# Patient Record
Sex: Male | Born: 1940
Health system: Southern US, Community
[De-identification: ages and names within clinical notes are randomized; demographics above are authoritative.]

## PROBLEM LIST (undated history)

## (undated) DIAGNOSIS — I499 Cardiac arrhythmia, unspecified: Secondary | ICD-10-CM

## (undated) DIAGNOSIS — I517 Cardiomegaly: Secondary | ICD-10-CM

## (undated) DIAGNOSIS — I712 Thoracic aortic aneurysm, without rupture: Secondary | ICD-10-CM

## (undated) DIAGNOSIS — E785 Hyperlipidemia, unspecified: Secondary | ICD-10-CM

## (undated) DIAGNOSIS — J8410 Pulmonary fibrosis, unspecified: Secondary | ICD-10-CM

## (undated) DIAGNOSIS — Z9889 Other specified postprocedural states: Secondary | ICD-10-CM

## (undated) DIAGNOSIS — I428 Other cardiomyopathies: Secondary | ICD-10-CM

## (undated) DIAGNOSIS — J849 Interstitial pulmonary disease, unspecified: Secondary | ICD-10-CM

## (undated) DIAGNOSIS — I509 Heart failure, unspecified: Secondary | ICD-10-CM

## (undated) DIAGNOSIS — I319 Disease of pericardium, unspecified: Secondary | ICD-10-CM

## (undated) DIAGNOSIS — Z9289 Personal history of other medical treatment: Secondary | ICD-10-CM

## (undated) DIAGNOSIS — I340 Nonrheumatic mitral (valve) insufficiency: Secondary | ICD-10-CM

## (undated) DIAGNOSIS — G4733 Obstructive sleep apnea (adult) (pediatric): Secondary | ICD-10-CM

## (undated) DIAGNOSIS — R112 Nausea with vomiting, unspecified: Secondary | ICD-10-CM

## (undated) DIAGNOSIS — I2510 Atherosclerotic heart disease of native coronary artery without angina pectoris: Secondary | ICD-10-CM

## (undated) DIAGNOSIS — R011 Cardiac murmur, unspecified: Secondary | ICD-10-CM

## (undated) DIAGNOSIS — I10 Essential (primary) hypertension: Secondary | ICD-10-CM

## (undated) DIAGNOSIS — R001 Bradycardia, unspecified: Secondary | ICD-10-CM

## (undated) DIAGNOSIS — I480 Paroxysmal atrial fibrillation: Secondary | ICD-10-CM

## (undated) HISTORY — DX: Paroxysmal atrial fibrillation: I48.0

## (undated) HISTORY — DX: Interstitial pulmonary disease, unspecified: J84.9

## (undated) HISTORY — PX: BACK SURGERY: SHX140

## (undated) HISTORY — DX: Bradycardia, unspecified: R00.1

## (undated) HISTORY — DX: Thoracic aortic aneurysm, without rupture: I71.2

## (undated) HISTORY — DX: Obstructive sleep apnea (adult) (pediatric): G47.33

## (undated) HISTORY — PX: ORIF FINGER / THUMB FRACTURE: SUR932

## (undated) HISTORY — PX: FRACTURE SURGERY: SHX138

## (undated) HISTORY — PX: TONSILLECTOMY: SUR1361

## (undated) HISTORY — DX: Other cardiomyopathies: I42.8

## (undated) HISTORY — DX: Atherosclerotic heart disease of native coronary artery without angina pectoris: I25.10

## (undated) HISTORY — DX: Pulmonary fibrosis, unspecified: J84.10

## (undated) HISTORY — PX: APPENDECTOMY: SHX54

## (undated) HISTORY — DX: Personal history of other medical treatment: Z92.89

---

## 1969-03-11 HISTORY — PX: LUMBAR LAMINECTOMY: SHX95

## 1996-07-11 HISTORY — PX: CARDIAC CATHETERIZATION: SHX172

## 2002-05-17 ENCOUNTER — Ambulatory Visit (HOSPITAL_COMMUNITY): Admission: RE | Admit: 2002-05-17 | Discharge: 2002-05-17 | Payer: Self-pay | Admitting: Gastroenterology

## 2003-09-29 ENCOUNTER — Encounter: Admission: RE | Admit: 2003-09-29 | Discharge: 2003-09-29 | Payer: Self-pay | Admitting: Internal Medicine

## 2003-09-30 ENCOUNTER — Ambulatory Visit (HOSPITAL_COMMUNITY): Admission: RE | Admit: 2003-09-30 | Discharge: 2003-09-30 | Payer: Self-pay | Admitting: Orthopedic Surgery

## 2003-09-30 ENCOUNTER — Ambulatory Visit (HOSPITAL_BASED_OUTPATIENT_CLINIC_OR_DEPARTMENT_OTHER): Admission: RE | Admit: 2003-09-30 | Discharge: 2003-09-30 | Payer: Self-pay | Admitting: Orthopedic Surgery

## 2013-06-27 ENCOUNTER — Encounter: Payer: Self-pay | Admitting: Interventional Cardiology

## 2013-06-27 ENCOUNTER — Ambulatory Visit (INDEPENDENT_AMBULATORY_CARE_PROVIDER_SITE_OTHER): Payer: Medicare Other | Admitting: Interventional Cardiology

## 2013-06-27 VITALS — BP 139/77 | HR 69 | Ht 74.5 in | Wt 228.0 lb

## 2013-06-27 DIAGNOSIS — R9431 Abnormal electrocardiogram [ECG] [EKG]: Secondary | ICD-10-CM

## 2013-06-27 DIAGNOSIS — R9430 Abnormal result of cardiovascular function study, unspecified: Secondary | ICD-10-CM

## 2013-06-27 DIAGNOSIS — I309 Acute pericarditis, unspecified: Secondary | ICD-10-CM

## 2013-06-27 DIAGNOSIS — E782 Mixed hyperlipidemia: Secondary | ICD-10-CM

## 2013-06-27 DIAGNOSIS — Z0389 Encounter for observation for other suspected diseases and conditions ruled out: Secondary | ICD-10-CM

## 2013-06-27 NOTE — Progress Notes (Signed)
Patient ID: Bruce Cohen, male   DOB: [birth year], 72 y.o.   MRN: [id number]     Patient ID: Bruce Cohen MRN: [id number] DOB/AGE: 1941/05/26 72 y.o.   Referring Physician Dr. Kevan Ny   Reason for Consultation abnormal ECG  HPI: 72 y/o who has had recurrent pericarditis.  He has been treated with prednisone.  He was initially hospitalized in [place] with the first episode.  This was in the 1990s in [place].  He had an episode in 1998.  He had a cath which was clean.  He was treated with prednisone.  He  Had 2 further episodes which were mild.    He was a Dr. Kevan Ny office for a physical. He was feeling well and had a routine ECG. There were issues getting the stickers to stay on his chest. There is artifact on the ECG and a question of ischemia was raised. He was referred here for further evaluation. He has had no symptoms of chest discomfort or shortness of breath recently. He walks some in his neighborhood and does not have any problems with physical activity at this point. He quit smoking several years ago.   Current Outpatient Prescriptions  Medication Sig Dispense Refill  . amLODipine (NORVASC) 5 MG tablet Take 5 mg by mouth daily.       Marland Kitchen aspirin 81 MG tablet Take 81 mg by mouth daily.      . cholecalciferol (VITAMIN D) 1000 UNITS tablet 1,000 Units. 2 TABLETS DAILY      . isosorbide mononitrate (IMDUR) 30 MG 24 hr tablet Take 30 mg by mouth daily.       . Multiple Vitamins-Minerals (MULTIVITAMIN PO) Take by mouth daily.      . rosuvastatin (CRESTOR) 20 MG tablet Take 20 mg by mouth daily.      . valsartan-hydrochlorothiazide (DIOVAN-HCT) 160-12.5 MG per tablet Take 1 tablet by mouth daily.       . Vitamin D, Ergocalciferol, (DRISDOL) 50000 UNITS CAPS capsule Take 50,000 Units by mouth every 7 (seven) days.       No current facility-administered medications for this visit.   No past medical history on file.  Family History  Problem Relation Age of Onset  . Heart disease Mother    VALVE SURGERY    History   Social History  . Marital Status: Married    Spouse Name: N/A    Number of Children: N/A  . Years of Education: N/A   Occupational History  . Not on file.   Social History Main Topics  . Smoking status: Former Smoker -- 20 years    Types: Cigarettes  . Smokeless tobacco: Not on file  . Alcohol Use: Not on file  . Drug Use: Not on file  . Sexual Activity: Not on file   Other Topics Concern  . Not on file   Social History Narrative  . No narrative on file    No past surgical history on file.    (Not in a hospital admission)  Review of systems complete and found to be negative unless listed above .  No nausea, vomiting.  No fever chills, No focal weakness,  No palpitations.  Physical Exam: Filed Vitals:   06/27/13 1127  BP: 139/77  Pulse: 69    Weight: 228 lb (103.42 kg)  Physical exam:  [place]/AT EOMI No JVD, No carotid bruit RRR S1S2  No wheezing Soft. NT, nondistended No edema. No focal motor or sensory deficits Normal affect  Labs:  No results found for this basename: WBC, HGB, HCT, MCV, PLT   No results found for this basename: NA, K, CL, CO2, BUN, CREATININE, CALCIUM, LABALBU, PROT, BILITOT, ALKPHOS, ALT, AST, GLUCOSE,  in the last 168 hours No results found for this basename: CKTOTAL, CKMB, CKMBINDEX, TROPONINI    No results found for this basename: CHOL   No results found for this basename: HDL   No results found for this basename: LDLCALC   No results found for this basename: TRIG   No results found for this basename: CHOLHDL   No results found for this basename: LDLDIRECT      Radiology: Normal LVEF by prior echocardiogram left ventricle hypertrophy EKG: Normal sinus rhythm up sloping ST segments inferiorly and laterally  ASSESSMENT AND PLAN:  Abnormal ECG: I think some of this was artifact. He has no symptoms. No ischemia evaluation at this time.  Repeat ECG in our office showed no clear signs of  ischemia.  Left ventricular hypertrophy/moderate mitral annular calcification/mild mitral regurgitation: Plan for echocardiogram if he has further symptoms  History of pericarditis: No symptoms at this point. We'll manage as needed.  Hyperlipidemia: Continue Crestor.  LDL 89 at last check.  Followup in one year. Signed:   Fredric Mare, MD, [facility] 06/27/2013, 11:37 AM

## 2013-06-27 NOTE — Patient Instructions (Signed)
Your physician has recommended you make the following change in your medication:   1. Stop Imdur.  Your physician wants you to follow-up in: 1 year follow up with Dr. Eldridge Dace. You will receive a reminder letter in the mail two months in advance. If you don't receive a letter, please call our office to schedule the follow-up appointment.

## 2013-06-28 DIAGNOSIS — R9430 Abnormal result of cardiovascular function study, unspecified: Secondary | ICD-10-CM | POA: Insufficient documentation

## 2013-06-28 DIAGNOSIS — I309 Acute pericarditis, unspecified: Secondary | ICD-10-CM | POA: Insufficient documentation

## 2013-06-28 DIAGNOSIS — E782 Mixed hyperlipidemia: Secondary | ICD-10-CM | POA: Insufficient documentation

## 2013-07-03 ENCOUNTER — Telehealth: Payer: Self-pay | Admitting: Interventional Cardiology

## 2013-07-03 MED ORDER — METHYLPREDNISOLONE 4 MG PO KIT: PACK | ORAL | Status: DC

## 2013-07-03 NOTE — Telephone Encounter (Signed)
New Problem:  Pt states he believes he is having a parakiditis issue and he would like the doctor to call him in something. Pt is requesting a call back.

## 2013-07-03 NOTE — Telephone Encounter (Signed)
Pt calls today b/c he states he thinks he is having an episode of pericarditis. Pt states it started after dinner last night becoming progressively worse & up in the neck.  States it was largely relieved by 2 Aleve last pm. Pt was able to sleep  Pt woke up this morning with the same pain & took 2 more Aleve. States this helps for about 3-4 hours but feels he should also be taking Prednisone as this is the medication regimen he has been on in the past for Pericarditis  States last episode of Pericarditis was 5-7 years ago. Will discuss with the DOD & return call to pt.  Dr. Patty Sermons reviewed & prescription for Medrol dosepak called in for patient Pt is aware & will pick up today  Mylo Red RN

## 2014-09-09 DIAGNOSIS — I480 Paroxysmal atrial fibrillation: Secondary | ICD-10-CM

## 2014-09-09 DIAGNOSIS — I4819 Other persistent atrial fibrillation: Secondary | ICD-10-CM

## 2014-09-09 HISTORY — DX: Paroxysmal atrial fibrillation: I48.0

## 2014-09-09 HISTORY — DX: Other persistent atrial fibrillation: I48.19

## 2014-09-29 ENCOUNTER — Encounter (HOSPITAL_COMMUNITY): Payer: Self-pay | Admitting: Emergency Medicine

## 2014-09-29 ENCOUNTER — Emergency Department (HOSPITAL_COMMUNITY): Payer: Medicare Other

## 2014-09-29 ENCOUNTER — Inpatient Hospital Stay (HOSPITAL_COMMUNITY): Payer: Medicare Other

## 2014-09-29 ENCOUNTER — Telehealth: Payer: Self-pay | Admitting: Interventional Cardiology

## 2014-09-29 ENCOUNTER — Inpatient Hospital Stay (HOSPITAL_COMMUNITY)
Admission: EM | Admit: 2014-09-29 | Discharge: 2014-10-03 | DRG: 280 | Disposition: A | Discharge status: Home / Self Care | Payer: Medicare Other | Attending: Family Medicine | Admitting: Family Medicine

## 2014-09-29 DIAGNOSIS — R0602 Shortness of breath: Secondary | ICD-10-CM | POA: Insufficient documentation

## 2014-09-29 DIAGNOSIS — N182 Chronic kidney disease, stage 2 (mild): Secondary | ICD-10-CM | POA: Diagnosis present

## 2014-09-29 DIAGNOSIS — J849 Interstitial pulmonary disease, unspecified: Secondary | ICD-10-CM | POA: Insufficient documentation

## 2014-09-29 DIAGNOSIS — Z87891 Personal history of nicotine dependence: Secondary | ICD-10-CM | POA: Diagnosis present

## 2014-09-29 DIAGNOSIS — E785 Hyperlipidemia, unspecified: Secondary | ICD-10-CM | POA: Diagnosis present

## 2014-09-29 DIAGNOSIS — J449 Chronic obstructive pulmonary disease, unspecified: Secondary | ICD-10-CM | POA: Diagnosis present

## 2014-09-29 DIAGNOSIS — I5021 Acute systolic (congestive) heart failure: Secondary | ICD-10-CM | POA: Diagnosis present

## 2014-09-29 DIAGNOSIS — I509 Heart failure, unspecified: Secondary | ICD-10-CM | POA: Diagnosis not present

## 2014-09-29 DIAGNOSIS — I10 Essential (primary) hypertension: Secondary | ICD-10-CM | POA: Diagnosis not present

## 2014-09-29 DIAGNOSIS — J9601 Acute respiratory failure with hypoxia: Secondary | ICD-10-CM | POA: Diagnosis present

## 2014-09-29 DIAGNOSIS — I472 Ventricular tachycardia: Secondary | ICD-10-CM | POA: Diagnosis present

## 2014-09-29 DIAGNOSIS — Z9114 Patient's other noncompliance with medication regimen: Secondary | ICD-10-CM | POA: Diagnosis present

## 2014-09-29 DIAGNOSIS — N183 Chronic kidney disease, stage 3 unspecified: Secondary | ICD-10-CM | POA: Diagnosis present

## 2014-09-29 DIAGNOSIS — F17210 Nicotine dependence, cigarettes, uncomplicated: Secondary | ICD-10-CM | POA: Diagnosis present

## 2014-09-29 DIAGNOSIS — I429 Cardiomyopathy, unspecified: Secondary | ICD-10-CM | POA: Diagnosis present

## 2014-09-29 DIAGNOSIS — I129 Hypertensive chronic kidney disease with stage 1 through stage 4 chronic kidney disease, or unspecified chronic kidney disease: Secondary | ICD-10-CM | POA: Diagnosis present

## 2014-09-29 DIAGNOSIS — I480 Paroxysmal atrial fibrillation: Secondary | ICD-10-CM | POA: Diagnosis present

## 2014-09-29 DIAGNOSIS — J810 Acute pulmonary edema: Secondary | ICD-10-CM | POA: Diagnosis not present

## 2014-09-29 DIAGNOSIS — Z9119 Patient's noncompliance with other medical treatment and regimen: Secondary | ICD-10-CM

## 2014-09-29 DIAGNOSIS — E876 Hypokalemia: Secondary | ICD-10-CM | POA: Diagnosis present

## 2014-09-29 DIAGNOSIS — J8410 Pulmonary fibrosis, unspecified: Secondary | ICD-10-CM

## 2014-09-29 DIAGNOSIS — I4891 Unspecified atrial fibrillation: Principal | ICD-10-CM | POA: Diagnosis present

## 2014-09-29 DIAGNOSIS — J189 Pneumonia, unspecified organism: Secondary | ICD-10-CM | POA: Diagnosis present

## 2014-09-29 DIAGNOSIS — F1010 Alcohol abuse, uncomplicated: Secondary | ICD-10-CM | POA: Diagnosis present

## 2014-09-29 DIAGNOSIS — I214 Non-ST elevation (NSTEMI) myocardial infarction: Secondary | ICD-10-CM | POA: Diagnosis present

## 2014-09-29 DIAGNOSIS — I2510 Atherosclerotic heart disease of native coronary artery without angina pectoris: Secondary | ICD-10-CM | POA: Diagnosis present

## 2014-09-29 DIAGNOSIS — Z7982 Long term (current) use of aspirin: Secondary | ICD-10-CM | POA: Diagnosis not present

## 2014-09-29 DIAGNOSIS — Z91199 Patient's noncompliance with other medical treatment and regimen due to unspecified reason: Secondary | ICD-10-CM

## 2014-09-29 DIAGNOSIS — Z720 Tobacco use: Secondary | ICD-10-CM | POA: Diagnosis present

## 2014-09-29 HISTORY — DX: Essential (primary) hypertension: I10

## 2014-09-29 HISTORY — DX: Cardiac murmur, unspecified: R01.1

## 2014-09-29 HISTORY — DX: Cardiomegaly: I51.7

## 2014-09-29 HISTORY — DX: Disease of pericardium, unspecified: I31.9

## 2014-09-29 HISTORY — DX: Nonrheumatic mitral (valve) insufficiency: I34.0

## 2014-09-29 HISTORY — DX: Hyperlipidemia, unspecified: E78.5

## 2014-09-29 LAB — CBC
HEMATOCRIT: 41.7 % (ref 39.0–52.0)
HEMOGLOBIN: 14 g/dL (ref 13.0–17.0)
MCH: 31.4 pg (ref 26.0–34.0)
MCHC: 33.6 g/dL (ref 30.0–36.0)
MCV: 93.5 fL (ref 78.0–100.0)
Platelets: 337 10*3/uL (ref 150–400)
RBC: 4.46 MIL/uL (ref 4.22–5.81)
RDW: 14.2 % (ref 11.5–15.5)
WBC: 12.8 10*3/uL — AB (ref 4.0–10.5)

## 2014-09-29 LAB — COMPREHENSIVE METABOLIC PANEL
ALBUMIN: 3.4 g/dL — AB (ref 3.5–5.2)
ALK PHOS: 84 U/L (ref 39–117)
ALT: 11 U/L (ref 0–53)
AST: 13 U/L (ref 0–37)
Anion gap: 6 (ref 5–15)
BILIRUBIN TOTAL: 1.2 mg/dL (ref 0.3–1.2)
BUN: 16 mg/dL (ref 6–23)
CHLORIDE: 107 mmol/L (ref 96–112)
CO2: 26 mmol/L (ref 19–32)
Calcium: 8.4 mg/dL (ref 8.4–10.5)
Creatinine, Ser: 1.1 mg/dL (ref 0.50–1.35)
GFR calc Af Amer: 74 mL/min — ABNORMAL LOW (ref >90)
GFR calc non Af Amer: 64 mL/min — ABNORMAL LOW (ref >90)
Glucose, Bld: 106 mg/dL — ABNORMAL HIGH (ref 70–99)
POTASSIUM: 3.9 mmol/L (ref 3.5–5.1)
SODIUM: 139 mmol/L (ref 135–145)
TOTAL PROTEIN: 7.1 g/dL (ref 6.0–8.3)

## 2014-09-29 LAB — I-STAT TROPONIN, ED: TROPONIN I, POC: 0.03 ng/mL (ref 0.00–0.08)

## 2014-09-29 LAB — PROTIME-INR
INR: 1.25 (ref 0.00–1.49)
Prothrombin Time: 15.9 seconds — ABNORMAL HIGH (ref 11.6–15.2)

## 2014-09-29 LAB — MAGNESIUM: Magnesium: 1.9 mg/dL (ref 1.5–2.5)

## 2014-09-29 LAB — TROPONIN I
TROPONIN I: 0.06 ng/mL — AB (ref <0.031)
Troponin I: 0.05 ng/mL — ABNORMAL HIGH (ref <0.031)

## 2014-09-29 LAB — BRAIN NATRIURETIC PEPTIDE: B NATRIURETIC PEPTIDE 5: 745.9 pg/mL — AB (ref 0.0–100.0)

## 2014-09-29 LAB — HEPARIN LEVEL (UNFRACTIONATED): Heparin Unfractionated: <0.1 IU/mL — ABNORMAL LOW (ref 0.30–0.70)

## 2014-09-29 LAB — STREP PNEUMONIAE URINARY ANTIGEN: STREP PNEUMO URINARY ANTIGEN: NEGATIVE

## 2014-09-29 LAB — TSH: TSH: 2.081 u[IU]/mL (ref 0.350–4.500)

## 2014-09-29 LAB — MRSA PCR SCREENING: MRSA by PCR: NEGATIVE

## 2014-09-29 MED ORDER — HEPARIN (PORCINE) IN NACL 100-0.45 UNIT/ML-% IJ SOLN
1200.0000 [IU]/h | INTRAMUSCULAR | Status: DC
  Administered 2014-09-29: 1200 [IU]/h via INTRAVENOUS
  Filled 2014-09-29: qty 250

## 2014-09-29 MED ORDER — LORAZEPAM 2 MG/ML IJ SOLN: 1.0000 mg | Freq: Four times a day (QID) | INTRAMUSCULAR | Status: AC | PRN

## 2014-09-29 MED ORDER — ALUM & MAG HYDROXIDE-SIMETH 200-200-20 MG/5ML PO SUSP: 30.0000 mL | Freq: Four times a day (QID) | ORAL | Status: DC | PRN

## 2014-09-29 MED ORDER — DILTIAZEM HCL 100 MG IV SOLR
5.0000 mg/h | INTRAVENOUS | Status: DC
  Administered 2014-09-30 (×2): 10 mg/h via INTRAVENOUS

## 2014-09-29 MED ORDER — DOCUSATE SODIUM 100 MG PO CAPS
100.0000 mg | ORAL_CAPSULE | Freq: Two times a day (BID) | ORAL | Status: DC
  Administered 2014-09-29 – 2014-10-03 (×8): 100 mg via ORAL
  Filled 2014-09-29 (×8): qty 1

## 2014-09-29 MED ORDER — LORAZEPAM 1 MG PO TABS
1.0000 mg | ORAL_TABLET | Freq: Four times a day (QID) | ORAL | Status: AC | PRN
  Administered 2014-09-29: 1 mg via ORAL
  Filled 2014-09-29: qty 1

## 2014-09-29 MED ORDER — ROSUVASTATIN CALCIUM 10 MG PO TABS
20.0000 mg | ORAL_TABLET | Freq: Every day | ORAL | Status: DC
  Administered 2014-09-29 – 2014-10-02 (×4): 20 mg via ORAL
  Filled 2014-09-29 (×4): qty 2

## 2014-09-29 MED ORDER — ADULT MULTIVITAMIN W/MINERALS CH
1.0000 | ORAL_TABLET | Freq: Every day | ORAL | Status: DC
  Administered 2014-09-29 – 2014-10-03 (×5): 1 via ORAL
  Filled 2014-09-29 (×5): qty 1

## 2014-09-29 MED ORDER — VITAMIN B-1 100 MG PO TABS
100.0000 mg | ORAL_TABLET | Freq: Every day | ORAL | Status: DC
  Administered 2014-09-29 – 2014-10-03 (×5): 100 mg via ORAL
  Filled 2014-09-29 (×5): qty 1

## 2014-09-29 MED ORDER — MAGNESIUM HYDROXIDE 400 MG/5ML PO SUSP
30.0000 mL | Freq: Every day | ORAL | Status: DC | PRN
  Filled 2014-09-29: qty 30

## 2014-09-29 MED ORDER — HEPARIN (PORCINE) IN NACL 100-0.45 UNIT/ML-% IJ SOLN
2250.0000 [IU]/h | INTRAMUSCULAR | Status: DC
  Administered 2014-09-30: 1700 [IU]/h via INTRAVENOUS
  Administered 2014-09-30: 2000 [IU]/h via INTRAVENOUS
  Administered 2014-10-01: 2250 [IU]/h via INTRAVENOUS
  Filled 2014-09-29 (×3): qty 250

## 2014-09-29 MED ORDER — DEXTROSE 5 % IV SOLN
1.0000 g | Freq: Once | INTRAVENOUS | Status: DC
  Filled 2014-09-29: qty 10

## 2014-09-29 MED ORDER — LEVOFLOXACIN IN D5W 750 MG/150ML IV SOLN
750.0000 mg | INTRAVENOUS | Status: DC
  Filled 2014-09-29: qty 150

## 2014-09-29 MED ORDER — FUROSEMIDE 10 MG/ML IJ SOLN
40.0000 mg | Freq: Two times a day (BID) | INTRAMUSCULAR | Status: DC
  Administered 2014-09-29 – 2014-10-01 (×5): 40 mg via INTRAVENOUS
  Filled 2014-09-29 (×5): qty 4

## 2014-09-29 MED ORDER — FOLIC ACID 1 MG PO TABS
1.0000 mg | ORAL_TABLET | Freq: Every day | ORAL | Status: DC
  Administered 2014-09-29 – 2014-10-03 (×5): 1 mg via ORAL
  Filled 2014-09-29 (×5): qty 1

## 2014-09-29 MED ORDER — OXYCODONE HCL 5 MG PO TABS: 5.0000 mg | ORAL_TABLET | ORAL | Status: DC | PRN

## 2014-09-29 MED ORDER — POTASSIUM CHLORIDE CRYS ER 20 MEQ PO TBCR
20.0000 meq | EXTENDED_RELEASE_TABLET | Freq: Once | ORAL | Status: AC
  Administered 2014-09-29: 20 meq via ORAL
  Filled 2014-09-29: qty 1

## 2014-09-29 MED ORDER — TRAZODONE HCL 50 MG PO TABS
25.0000 mg | ORAL_TABLET | Freq: Every evening | ORAL | Status: DC | PRN
  Administered 2014-10-01: 25 mg via ORAL
  Filled 2014-09-29: qty 1

## 2014-09-29 MED ORDER — LORAZEPAM 2 MG/ML IJ SOLN
0.0000 mg | Freq: Two times a day (BID) | INTRAMUSCULAR | Status: DC
Start: 2014-10-01 — End: 2014-10-03

## 2014-09-29 MED ORDER — THIAMINE HCL 100 MG/ML IJ SOLN
100.0000 mg | Freq: Every day | INTRAMUSCULAR | Status: DC
  Filled 2014-09-29: qty 2

## 2014-09-29 MED ORDER — ONDANSETRON HCL 4 MG PO TABS: 4.0000 mg | ORAL_TABLET | Freq: Four times a day (QID) | ORAL | Status: DC | PRN

## 2014-09-29 MED ORDER — HEPARIN BOLUS VIA INFUSION
2500.0000 [IU] | Freq: Once | INTRAVENOUS | Status: AC
  Administered 2014-09-29: 2500 [IU] via INTRAVENOUS
  Filled 2014-09-29: qty 2500

## 2014-09-29 MED ORDER — LEVOFLOXACIN IN D5W 750 MG/150ML IV SOLN
750.0000 mg | Freq: Once | INTRAVENOUS | Status: AC
  Administered 2014-09-29: 750 mg via INTRAVENOUS
  Filled 2014-09-29: qty 150

## 2014-09-29 MED ORDER — SODIUM CHLORIDE 0.9 % IJ SOLN
3.0000 mL | Freq: Two times a day (BID) | INTRAMUSCULAR | Status: DC
  Administered 2014-09-30 – 2014-10-03 (×5): 3 mL via INTRAVENOUS

## 2014-09-29 MED ORDER — DEXTROSE 5 % IV SOLN
5.0000 mg/h | Freq: Once | INTRAVENOUS | Status: AC
  Administered 2014-09-29: 5 mg/h via INTRAVENOUS

## 2014-09-29 MED ORDER — ACETAMINOPHEN 650 MG RE SUPP
650.0000 mg | Freq: Four times a day (QID) | RECTAL | Status: DC | PRN
Start: 2014-09-29 — End: 2014-10-03

## 2014-09-29 MED ORDER — MORPHINE SULFATE 2 MG/ML IJ SOLN: 1.0000 mg | INTRAMUSCULAR | Status: DC | PRN

## 2014-09-29 MED ORDER — ONDANSETRON HCL 4 MG/2ML IJ SOLN
4.0000 mg | Freq: Four times a day (QID) | INTRAMUSCULAR | Status: DC | PRN
Start: 2014-09-29 — End: 2014-10-03

## 2014-09-29 MED ORDER — HEPARIN BOLUS VIA INFUSION
4000.0000 [IU] | Freq: Once | INTRAVENOUS | Status: AC
  Administered 2014-09-29: 4000 [IU] via INTRAVENOUS
  Filled 2014-09-29: qty 4000

## 2014-09-29 MED ORDER — DEXTROSE 5 % IV SOLN
500.0000 mg | Freq: Once | INTRAVENOUS | Status: DC
  Filled 2014-09-29: qty 500

## 2014-09-29 MED ORDER — ASPIRIN EC 81 MG PO TBEC
81.0000 mg | DELAYED_RELEASE_TABLET | Freq: Every day | ORAL | Status: DC
  Administered 2014-09-30 – 2014-10-02 (×3): 81 mg via ORAL
  Filled 2014-09-29 (×6): qty 1

## 2014-09-29 MED ORDER — LORAZEPAM 2 MG/ML IJ SOLN: 0.0000 mg | Freq: Four times a day (QID) | INTRAMUSCULAR | Status: AC

## 2014-09-29 MED ORDER — CHLORHEXIDINE GLUCONATE CLOTH 2 % EX PADS: 6.0000 | MEDICATED_PAD | Freq: Every day | CUTANEOUS | Status: DC

## 2014-09-29 MED ORDER — ACETAMINOPHEN 325 MG PO TABS
650.0000 mg | ORAL_TABLET | Freq: Four times a day (QID) | ORAL | Status: DC | PRN
  Administered 2014-09-29: 650 mg via ORAL
  Filled 2014-09-29: qty 2

## 2014-09-29 NOTE — Telephone Encounter (Signed)
I spoke with the patient's wife. She called to report the patient was en route to the [facility] ER currrently. He has had several months of progressively worsening SOB. The last 4 days have been the worst.  Per the patient's wife, she has encouraged him to seek medical treatment, but he would not schedule follow up with Dr. Irish Lack or Dr. Inda Merlin.  The patient's wife also reports that he is not truthful with providers when he is seen.  Per her report, he will not take his medications unless he is scheduled to see a provider, then he will take them for a few days prior.  He is smoking 2 PPD, but will tell his providers he is not smoking very much. He is drinking "a lot" per her report and will go on 12 hour binges, but again, will report to providers that his alcohol consumption is minimal.  She denies that the patient has had any lower extremity edema or abdominal fullness.  The family did try to get him to the ER this weekend, but he refused.  This morning, he requested they call EMS.  I advised the patient's wife this could be cardiac vs pulmonary, I am uncertain, but it was best for the patient to agree to evaluation in the ER.  She wanted to let Dr. Irish Lack know. I advised I would forward the message to him, but that the ER MD will evaluate him first and call for a cardiology consult if needed.  He has a history of pericarditis.

## 2014-09-29 NOTE — ED Provider Notes (Signed)
CSN: [id number]     Arrival date & time 09/29/14  1030 History   First MD Initiated Contact with Patient 09/29/14 1038     Chief Complaint  Patient presents with  . Atrial Fibrillation     (Consider location/radiation/quality/duration/timing/severity/associated sxs/prior Treatment) Patient is a 74 y.o. male presenting with atrial fibrillation and shortness of breath. The history is provided by the patient.  Atrial Fibrillation This is a new problem. Episode onset: unknown. The problem occurs constantly. The problem has not changed since onset.Associated symptoms include chest pain (heaviness with SOB) and shortness of breath. Pertinent negatives include no abdominal pain. Nothing aggravates the symptoms. Nothing relieves the symptoms.  Shortness of Breath Severity:  Moderate Onset quality:  Gradual Timing:  Constant Progression:  Unchanged Chronicity:  New Context: not URI   Relieved by:  Nothing Worsened by:  Nothing tried Associated symptoms: chest pain (heaviness with SOB) and cough   Associated symptoms: no abdominal pain, no fever and no vomiting     History reviewed. No pertinent past medical history. History reviewed. No pertinent past surgical history. Family History  Problem Relation Age of Onset  . Heart disease Mother     VALVE SURGERY   History  Substance Use Topics  . Smoking status: Former Smoker -- 20 years    Types: Cigarettes  . Smokeless tobacco: Not on file  . Alcohol Use: Not on file    Review of Systems  Constitutional: Negative for fever and chills.  Respiratory: Positive for cough and shortness of breath.   Cardiovascular: Positive for chest pain (heaviness with SOB).  Gastrointestinal: Negative for vomiting and abdominal pain.  All other systems reviewed and are negative.     Allergies  Review of patient's allergies indicates no known allergies.  Home Medications   Prior to Admission medications   Medication Sig Start Date End Date  Taking? Authorizing Provider  amLODipine (NORVASC) 5 MG tablet Take 5 mg by mouth daily.  06/20/13   Historical Provider, MD  aspirin 81 MG tablet Take 81 mg by mouth daily.    Historical Provider, MD  cholecalciferol (VITAMIN D) 1000 UNITS tablet 1,000 Units. 2 TABLETS DAILY    Historical Provider, MD  methylPREDNISolone (MEDROL DOSEPAK) 4 MG tablet follow package directions 07/03/13   Darlin Coco, MD  Multiple Vitamins-Minerals (MULTIVITAMIN PO) Take by mouth daily.    Historical Provider, MD  rosuvastatin (CRESTOR) 20 MG tablet Take 20 mg by mouth daily.    Historical Provider, MD  valsartan-hydrochlorothiazide (DIOVAN-HCT) 160-12.5 MG per tablet Take 1 tablet by mouth daily.  06/20/13   Historical Provider, MD  Vitamin D, Ergocalciferol, (DRISDOL) 50000 UNITS CAPS capsule Take 50,000 Units by mouth every 7 (seven) days.    Historical Provider, MD   BP 138/96 mmHg  Pulse 100  Temp(Src) 97.6 F (36.4 C) (Oral)  Resp 34  SpO2 92% Physical Exam  Constitutional: He is oriented to person, place, and time. He appears well-developed and well-nourished. No distress.  HENT:  Head: Normocephalic and atraumatic.  Mouth/Throat: No oropharyngeal exudate.  Eyes: EOM are normal. Pupils are equal, round, and reactive to light.  Neck: Normal range of motion. Neck supple.  Cardiovascular: Normal rate.  An irregularly irregular rhythm present. Exam reveals no friction rub.   No murmur heard. Pulmonary/Chest: Effort normal. No respiratory distress. He has wheezes (mild, diffuse). He has rales (mid and lower lobes).  Abdominal: He exhibits no distension. There is no tenderness. There is no rebound.  Musculoskeletal: Normal range  of motion. He exhibits no edema.  Neurological: He is alert and oriented to person, place, and time.  Skin: He is not diaphoretic.    ED Course  Procedures (including critical care time) Labs Review Labs Reviewed  CBC - Abnormal; Notable for the following:    WBC 12.8  (*)    All other components within normal limits  COMPREHENSIVE METABOLIC PANEL  BRAIN NATRIURETIC PEPTIDE  PROTIME-INR  Randolm Idol, ED    Imaging Review Dg Chest 2 View  09/29/2014   CLINICAL DATA:  Shortness of breath for 5 days.  EXAM: CHEST  2 VIEW  COMPARISON:  None.  FINDINGS: There is a degree of underlying emphysema. There is widespread interstitial opacity throughout the lungs diffusely. There is some patchy airspace consolidation in the upper lobes, more on the right than on the left. The heart is borderline enlarged with pulmonary vascularity within normal limits. No effusions. No adenopathy. No bone lesions.  IMPRESSION: Question a degree of congestive heart failure superimposed on emphysema. All of the changes which are present may represent interstitial fibrosis as opposed to edema. Without prior studies, this differentiation is difficult. Patchy opacity in the upper lobes potentially could also represent a degree of superimposed pneumonia. Given these changes, a followup study in 5-7 days is advised to assess for stability.   Electronically Signed   By: Lowella Grip III M.D.   On: 09/29/2014 11:44     EKG Interpretation   Date/Time:  Monday September 29 2014 10:37:58 EDT Ventricular Rate:  91 PR Interval:    QRS Duration: 110 QT Interval:  387 QTC Calculation: 476 R Axis:   84 Text Interpretation:  Atrial fibrillation vs Atrial flutter - new Probable  lateral infarct, age indeterminate Confirmed by Mingo Amber  MD, Pruitt Taboada [phone])  on 09/29/2014 10:40:53 AM     CRITICAL CARE Performed by: Osvaldo Shipper   Total critical care time: 30 minutes  Critical care time was exclusive of separately billable procedures and treating other patients.  Critical care was necessary to treat or prevent imminent or life-threatening deterioration.  Critical care was time spent personally by me on the following activities: development of treatment plan with patient and/or  surrogate as well as nursing, discussions with consultants, evaluation of patient's response to treatment, examination of patient, obtaining history from patient or surrogate, ordering and performing treatments and interventions, ordering and review of laboratory studies, ordering and review of radiographic studies, pulse oximetry and re-evaluation of patient's condition.  MDM   Final diagnoses:  Shortness of breath  Atrial fibrillation with RVR  Community acquired pneumonia    86M here with shortness of breath. Present for the past 5 days, intermittent episodes of SOB. No CP, but does have some mild chest heaviness/tightness with his SOB. No hx of COPD, asthma.  EKG shows Afib/Aflutter, new from prior. Denies any palpitations. CXR with some rales/wheezes. Without hx of lung disease, concern for new CHF. No hypoxia, but feels better on oxygen. CXR shows CHF vs pneumonia vs interstitial issues.  Does have a white count, will get cultures and plan for admission. Will await BNP to decide on Lasix. He did get Cardizem with EMS which improved his symptoms. After cardizem wore off, HR began to get elevated again. Cardizem gtt initiated.    Evelina Bucy, MD 09/29/14 [phone]

## 2014-09-29 NOTE — Telephone Encounter (Signed)
New Msg       Pt is having trouble breathing and its getting worse.   Pt is on the way to the ER by ambulance. Pt wife calling to inform Dr. Irish Lack of this.    Wife not sure of which hospital.   Please return call to wife.

## 2014-09-29 NOTE — Progress Notes (Signed)
ANTICOAGULATION CONSULT NOTE - Follow-Up Consult  Pharmacy Consult:  Heparin Indication: atrial fibrillation  No Known Allergies  Patient Measurements: Height: 6\' 2"  (188 cm) Weight: 208 lb 6.4 oz (94.53 kg) IBW/kg (Calculated) : 82.2 Heparin Dosing Weight: 84 kg  Vital Signs: Temp: 97.5 F (36.4 C) (03/21 2000) Temp Source: Oral (03/21 2000) BP: 151/93 mmHg (03/21 1650) Pulse Rate: 101 (03/21 1650)  Labs:  Recent Labs  09/29/14 1125 09/29/14 1827 09/29/14 2043  HGB 14.0  --   --   HCT 41.7  --   --   PLT 337  --   --   LABPROT 15.9*  --   --   INR 1.25  --   --   HEPARINUNFRC  --   --  <0.10*  CREATININE 1.10  --   --   TROPONINI  --  0.05*  --     Estimated Creatinine Clearance: 68.5 mL/min (by C-G formula based on Cr of 1.1).   Medical History: Past Medical History  Diagnosis Date  . LVH (left ventricular hypertrophy)   . Mitral regurgitation   . Pericarditis     a. Hospitalized in [place]. Episode in 1998. Had a cath which was clean (also in 1990s). 2 further episodes that were mild.  . Hyperlipidemia   . Hypertension   . Atrial fibrillation with RVR 09/2014    Newly recognized/notes 09/29/2014      Assessment: 5 YOM presented with worsening SOB and found to be in Afib with RVR.  Pharmacy consulted to initiate IV heparin.  Baseline labs and home meds reviewed.  Initial heparin level undetectable on heparin 1200 units/hr.  No bleeding or complications noted.  Goal of Therapy:  Heparin level 0.3-0.7 units/ml Monitor platelets by anticoagulation protocol: Yes  Plan:  -Heparin 2500 units IV x 1 -Then increase IV heparin gtt to 1450 units/hr. -Recheck heparin level and CBC with AM labs.  Uvaldo Rising, BCPS  Clinical Pharmacist Pager [phone]  09/29/2014 9:54 PM

## 2014-09-29 NOTE — ED Notes (Signed)
Per EMS pt c/o chest pressure, SOB for the past 5 days. EMS arrival noted patient in Afib RVR. Pt has no hx of Afib. Pt received 20 Cardizem en route. Pt initial BP 190/110, HR 160 post Cardizem BP 150/80, HR 80s. Pt currently noted to be SOB, BP 152/91, HR 106, O2 98% Rm Air. Pt has a hx of  EKG changes in ST segment that have occurred from a childhood diagnosis. Pt sts he has been misdiagnosis for AMI in the past.

## 2014-09-29 NOTE — H&P (Signed)
[facility] History and Physical                                                                                    Bruce Cohen, is a 74 y.o. male  MRN: [id number]   DOB - May 12, 1941  Admit Date - 09/29/2014  Outpatient Primary MD for the patient is Bruce NEVILL, MD  With History of -  Hypertension Pericarditis  History reviewed. No pertinent past surgical history.  in for   Chief Complaint  Patient presents with  . Atrial Fibrillation     HPI 74 year old male patient past medical history of hypertension and P carditis as well as ongoing tobacco abuse. Presents to the hospital with progressive shortness of breath with dyspnea on exertion and intermittent orthopnea. Patient states symptoms have been ongoing for 5 days and began to become worse over the past 24 hours. He did not use any palpitations or chest pain but did have a heaviness in his chest. He has not expressed any lower extremity edema and has not had any cough fevers or chills or other constitutional symptoms.  EMS was called to the home and the patient was noted to be in atrial fibrillation with RVR with ventricular rates at least in the 120s he was given a bolus of IV Cardizem and transported to [facility]. Upon arrival to the ER he was afebrile, he was to Make with the Restoril to 34, his heart rate was 95 and he remained in atrial fibrillation. His O2 sats are 96% on room air but given his symptoms oxygen was applied. The chest x-ray revealed COPD with widespread interstitial opacity throughout the lungs diffusely with some patchy airspace consolidation in the upper lobes more right than the left as well as a degree of likely heart failure. Patient did have leukocytosis as well with a white count of 12,800. His BNP was 746 and troponin was negative. Glucose was mildly elevated at 106. EKG revealed atrial fibrillation with flutter waves and some of the leads with ventricular rate 91. Subsequently  patient's heart rate has begun to climb and at times peaks up into the 120s. During our questioning of the patient he confirm the above symptoms prior to arrival. His son also from the patient had not taken any of his prescribed medications including blood pressure medicines for at least 6 months. The patient reports he quit taking his medications because he had become frustrated with the healthcare in pharmacy system and cost of medications. She also reports he did not receive a flu shot this year.  Review of Systems   In addition to the HPI above,  No Fever-chills, myalgias or other constitutional symptoms No Headache, changes with Vision or hearing, new weakness, tingling, numbness in any extremity, No problems swallowing food or Liquids, indigestion/reflux No Chest pain, Cough or Shortness of Breath, palpitations, orthopnea or DOE No Abdominal pain, N/V; no melena or hematochezia, no dark tarry stools, Bowel movements are regular, No dysuria, hematuria or flank pain No new skin rashes, lesions, masses or bruises, No new joints pains-aches No recent weight gain or loss No polyuria, polydypsia or polyphagia,  *A full 10 point  Review of Systems was done, except as stated above, all other Review of Systems were negative.  Social History History  Substance Use Topics  . Smoking status:  Current Smoker -- 20 years    Types: Cigarettes  . Smokeless tobacco: Not on file  . Alcohol Use: Not on file    Family History Family History  Problem Relation Age of Onset  . Heart disease Mother Father      VALVE SURGERY Heart disease     Prior to Admission medications   Medication Sig Start Date End Date Taking? Authorizing Provider  aspirin 81 MG tablet Take 81 mg by mouth daily.   Yes Historical Provider, MD  cholecalciferol (VITAMIN D) 1000 UNITS tablet 1,000 Units. 2 TABLETS DAILY   Yes Historical Provider, MD  amLODipine (NORVASC) 5 MG tablet Take 5 mg by mouth daily.  06/20/13    Historical Provider, MD  methylPREDNISolone (MEDROL DOSEPAK) 4 MG tablet follow package directions 07/03/13   Darlin Coco, MD  Multiple Vitamins-Minerals (MULTIVITAMIN PO) Take by mouth daily.    Historical Provider, MD  rosuvastatin (CRESTOR) 20 MG tablet Take 20 mg by mouth daily.    Historical Provider, MD  valsartan-hydrochlorothiazide (DIOVAN-HCT) 160-12.5 MG per tablet Take 1 tablet by mouth daily.  06/20/13   Historical Provider, MD  Vitamin D, Ergocalciferol, (DRISDOL) 50000 UNITS CAPS capsule Take 50,000 Units by mouth every 7 (seven) days.    Historical Provider, MD    No Known Allergies  Physical Exam  Vitals  Blood pressure 158/116, pulse 74, temperature 97.6 F (36.4 C), temperature source Oral, resp. rate 35, height 6\' 2"  (1.88 m), weight 185 lb (83.915 kg), SpO2 98 %.   General:  In no acute distress, appears healthy and well nourished  Psych:  Normal affect, Denies Suicidal or Homicidal ideations, Awake Alert, Oriented X 3. Speech and thought patterns are clear and appropriate, no apparent short term memory deficits  Neuro:   No focal neurological deficits, CN II through XII intact, Strength 5/5 all 4 extremities, Sensation intact all 4 extremities.  ENT:  Ears and Eyes appear Normal, Conjunctivae clear, PER. Moist oral mucosa without erythema or exudates.  Neck:  Supple, No lymphadenopathy appreciated  Respiratory:  Symmetrical chest wall movement, Good air movement bilaterally, bilateral crackles, no cannula oxygen  Cardiac: Irregular with atrial fibrillation, No Murmurs, no LE edema noted, no JVD, No carotid bruits, peripheral pulses palpable at 2+  Abdomen:  Positive bowel sounds, Soft, Non tender, Non distended,  No masses appreciated, no obvious hepatosplenomegaly  Skin:  No Cyanosis, Normal Skin Turgor, No Skin Rash or Bruise.  Extremities: Symmetrical without obvious trauma or injury,  no effusions.  Data Review  CBC  Recent Labs Lab  09/29/14 1125  WBC 12.8*  HGB 14.0  HCT 41.7  PLT 337  MCV 93.5  MCH 31.4  MCHC 33.6  RDW 14.2    Chemistries   Recent Labs Lab 09/29/14 1125  NA 139  K 3.9  CL 107  CO2 26  GLUCOSE 106*  BUN 16  CREATININE 1.10  CALCIUM 8.4  AST 13  ALT 11  ALKPHOS 84  BILITOT 1.2    estimated creatinine clearance is 68.5 mL/min (by C-G formula based on Cr of 1.1).  No results for input(s): TSH, T4TOTAL, T3FREE, THYROIDAB in the last 72 hours.  Invalid input(s): FREET3  Coagulation profile  Recent Labs Lab 09/29/14 1125  INR 1.25    No results for input(s): DDIMER in the last  72 hours.  Cardiac Enzymes No results for input(s): CKMB, TROPONINI, MYOGLOBIN in the last 168 hours.  Invalid input(s): CK  Invalid input(s): POCBNP  Urinalysis No results found for: COLORURINE, APPEARANCEUR, LABSPEC, PHURINE, GLUCOSEU, HGBUR, BILIRUBINUR, KETONESUR, PROTEINUR, UROBILINOGEN, NITRITE, LEUKOCYTESUR  Imaging results:   Dg Chest 2 View  09/29/2014   CLINICAL DATA:  Shortness of breath for 5 days.  EXAM: CHEST  2 VIEW  COMPARISON:  None.  FINDINGS: There is a degree of underlying emphysema. There is widespread interstitial opacity throughout the lungs diffusely. There is some patchy airspace consolidation in the upper lobes, more on the right than on the left. The heart is borderline enlarged with pulmonary vascularity within normal limits. No effusions. No adenopathy. No bone lesions.  IMPRESSION: Question a degree of congestive heart failure superimposed on emphysema. All of the changes which are present may represent interstitial fibrosis as opposed to edema. Without prior studies, this differentiation is difficult. Patchy opacity in the upper lobes potentially could also represent a degree of superimposed pneumonia. Given these changes, a followup study in 5-7 days is advised to assess for stability.   Electronically Signed   By: Lowella Grip III M.D.   On: 09/29/2014 11:44      EKG: Atrial fibrillation/flutter with ventricular rate 91 bpm, QTC 476   Assessment & Plan  Principal Problem:   New Onset Atrial fibrillation with RVR -Admit to stepdown -BeginCardizem infusion -CHADVASC is 2 so we will begin IV heparin -Cardiology consulted -Check TSH  Active Problems:   Acute respiratory failure with hypoxia/Pulmonary edema, acute/? Atypical pneumonia -Begin Lasix 40 mg IV q 12 hours first dose now -Check 2-D echocardiogram -Has leukocytosis and patient did not receive influenza vaccine this year so could have atypical presentation of flu pneumonitis so check influenza panel and begin droplet precautions -Cover for possible community acquired pneumonia as well with Levaquin -Chest x-ray also appears to have some chronic interstitial disease concerning for possible urinary fibrosis so we'll obtain CT of the chest -Check HIV, blood cultures, sputum culture (if patient can produce), urinary strep and Legionella panel    HTN  -Blood pressure currently moderately controlled and was not taking medications prior to admission -Currently will utilize IV Cardizem for hypertensive control    COPD  -Clearly documented on chest x-ray and with history ongoing tobacco abuse -No wheezing and no signs of exacerbation at this juncture    CKD, stage II -No baseline labs for comparison    Patient nonadherence -Scars with patient at time of admission importance of taking all medications especially antihypertensive medications    Tobacco abuse -Counseled regarding cessation    DVT Prophylaxis: Full dose heparin  Family Communication:   Son at bedside  Code Status:  Full code  Condition:  Stable  Time spent in minutes : 60   ELLIS,ALLISON L. ANP on 09/29/2014 at 12:18 PM  Between 7am to 7pm - Pager - [phone]  After 7pm go to [email or web address] - password [email or web address]  And look for the night coverage person covering me after hours  [facility]

## 2014-09-29 NOTE — Progress Notes (Signed)
Paged Dr. Luiz Ochoa regarding pt's 15 beat run of Vtach. Pt asymptomatic. Orders received for a magnesium level and oral potassium. Will continue to monitor pt.

## 2014-09-29 NOTE — Progress Notes (Signed)
Consulting cardiology team called to make [place] aware that documentation has been found in the EMR regarding pt with hidden alcohol use as well as binge drinking. Will initiate CIWA protocol.  Erin Hearing, ANP

## 2014-09-29 NOTE — Consult Note (Signed)
Cardiology Consultation Note  Patient ID: Bruce Cohen, MRN: [id number], DOB/AGE: 12/20/1940 74 y.o. Admit date: 09/29/2014   Date of Consult: 09/29/2014 Primary Physician: Henrine Screws, MD Primary Cardiologist: Seen by Irish Lack for abnormal EKG 06/2013  Chief Complaint: SOB Reason for Consultation: newly recognized atrial fibrillation, probable new CHF  HPI: Bruce Cohen is a 74 y/o M with history of 60-years tobacco use, prior recurrent pericarditis, HTN, HLD, LVH, possible undisclosed EtOH use who presented to [facility] with 4-day history of dyspnea and newly recognized AF RVR. Regarding cardiac history, Bruce Cohen was initially hospitalized in [place] with Bruce Cohen first episode of pericarditis in the 1990s. Bruce Cohen had an episode in 1998 and cath was reportedly clean per patient. Bruce Cohen was treated with prednisone. Bruce Cohen had 2 further episodes which were mild.Bruce Cohen was seen by Dr. Irish Lack in 2014 for abnormal EKG in the setting of difficult sticker adherence, with subsequent EKG without clear signs of ischemia. 2D Echo 2012: mild-mod LVH, EF 55-60%, mild LAE, mod mitral annular calcification, mild MV regurgitation, grade 1 diastolic dysfunction.  Bruce Cohen has not been taking Bruce Cohen antihypertensives recently but denies any particular reason. Bruce Cohen noticed 4 days ago Bruce Cohen was becoming gradually more SOB. Dyspnea occurred initially with exertion but Bruce Cohen now Bruce Cohen even has spells when doing nothing at all. + Orthopnea. Bruce Cohen endorses a sensation of chest tightness when Bruce Cohen breathing gets particularly bad. No LEE, known weight changes, awareness of palpitations, syncope, fever, chills, sick contacts, recent travel/surgery/bedrest, history of bleeding or stroke. Due to worsened symptoms Bruce Cohen called EMS and was noted to be in AF RVR. Initial BP 190/110, 96-98% RA but tachypneic, HR 160s -> received 20mg  IV cardizem, now on a drip. Workup thus far reveals WBC 12.8k, BNP 745, glucose 106 otherwise labs nonacute including normal TSH and  troponin neg x 1. CXR with possible CHF superimposed on emphysema, versus interstitial fibrosis. High resolution CT has been ordered by IM. Started on Lasix 40mg  IV BID with brisk UOP thus far and modest improvement in dyspnea. Infectious respiratory workup underway.  Per phone note between our office and the patient's wife, "Bruce Cohen is drinking 'a lot' per her report and will go on 12 hour binges, but again, will report to providers that Bruce Cohen alcohol consumption is minimal." Patient reported rare alcohol use in the ER. Last cigarette 4 days ago due to dyspnea.  Past Medical History  Diagnosis Date  . LVH (left ventricular hypertrophy)   . Mitral regurgitation   . Pericarditis     a. Hospitalized in [place]. Episode in 1998. Had a cath which was clean. 2 further episodes that were mild.  . Hyperlipidemia   . Hypertension       Most Recent Cardiac Studies: 2D Echo 2012: mild-mod LVH, EF 55-60%, mild ALE, mod mitral annular calcification, mild MV regurgitation, grade 1 diastolic dysfunction.   Surgical History: History reviewed. No pertinent past surgical history.   Home Meds: Prior to Admission medications   Medication Sig Start Date End Date Taking? Authorizing Provider  aspirin 81 MG tablet Take 81 mg by mouth daily.   Yes Historical Provider, MD  cholecalciferol (VITAMIN D) 1000 UNITS tablet 1,000 Units. 2 TABLETS DAILY   Yes Historical Provider, MD  amLODipine (NORVASC) 5 MG tablet Take 5 mg by mouth daily.  06/20/13   Historical Provider, MD  methylPREDNISolone (MEDROL DOSEPAK) 4 MG tablet follow package directions 07/03/13   Darlin Coco, MD  Multiple Vitamins-Minerals (MULTIVITAMIN PO) Take by mouth daily.  Historical Provider, MD  rosuvastatin (CRESTOR) 20 MG tablet Take 20 mg by mouth daily.    Historical Provider, MD  valsartan-hydrochlorothiazide (DIOVAN-HCT) 160-12.5 MG per tablet Take 1 tablet by mouth daily.  06/20/13   Historical Provider, MD  Vitamin D, Ergocalciferol,  (DRISDOL) 50000 UNITS CAPS capsule Take 50,000 Units by mouth every 7 (seven) days.    Historical Provider, MD    Inpatient Medications:  . furosemide  40 mg Intravenous BID   . diltiazem (CARDIZEM) infusion    . heparin 1,200 Units/hr (09/29/14 1318)  . [START ON 09/30/2014] levofloxacin (LEVAQUIN) IV      Allergies: No Known Allergies  History   Social History  . Marital Status: Married    Spouse Name: N/A  . Number of Children: N/A  . Years of Education: N/A   Occupational History  . Not on file.   Social History Main Topics  . Smoking status: Current Every Day Smoker -- 60 years    Types: Cigarettes  . Smokeless tobacco: Not on file  . Alcohol Use: No  . Drug Use: No  . Sexual Activity: Not on file   Other Topics Concern  . Not on file   Social History Narrative     Family History  Problem Relation Age of Onset  . Heart disease Mother     VALVE SURGERY  . Heart failure Father   . CAD Neg Hx      Review of Systems All other systems reviewed and are otherwise negative except as noted above.  Labs: Troponin neg x 1.  Lab Results  Component Value Date   WBC 12.8* 09/29/2014   HGB 14.0 09/29/2014   HCT 41.7 09/29/2014   MCV 93.5 09/29/2014   PLT 337 09/29/2014    Recent Labs Lab 09/29/14 1125  NA 139  K 3.9  CL 107  CO2 26  BUN 16  CREATININE 1.10  CALCIUM 8.4  PROT 7.1  BILITOT 1.2  ALKPHOS 84  ALT 11  AST 13  GLUCOSE 106*   No results found for: CHOL, HDL, LDLCALC, TRIG No results found for: DDIMER  Radiology/Studies:  Dg Chest 2 View  09/29/2014   CLINICAL DATA:  Shortness of breath for 5 days.  EXAM: CHEST  2 VIEW  COMPARISON:  None.  FINDINGS: There is a degree of underlying emphysema. There is widespread interstitial opacity throughout the lungs diffusely. There is some patchy airspace consolidation in the upper lobes, more on the right than on the left. The heart is borderline enlarged with pulmonary vascularity within normal  limits. No effusions. No adenopathy. No bone lesions.  IMPRESSION: Question a degree of congestive heart failure superimposed on emphysema. All of the changes which are present may represent interstitial fibrosis as opposed to edema. Without prior studies, this differentiation is difficult. Patchy opacity in the upper lobes potentially could also represent a degree of superimposed pneumonia. Given these changes, a followup study in 5-7 days is advised to assess for stability.   Electronically Signed   By: Lowella Grip III M.D.   On: 09/29/2014 11:44    Wt Readings from Last 3 Encounters:  09/29/14 185 lb (83.915 kg)  06/27/13 228 lb (103.42 kg)    EKG: atrial fibrillation 91bpm with nonspecific ST-T wave changes  Physical Exam: Blood pressure 140/90, pulse 112, temperature 97.6 F (36.4 C), temperature source Oral, resp. rate 40, height 6\' 2"  (1.88 m), weight 185 lb (83.915 kg), SpO2 98 %. General: Well developed, well nourished  WM, in no acute distress. Head: Normocephalic, atraumatic, sclera non-icteric, no xanthomas, nares are without discharge.  Neck: Negative for carotid bruits. JVD not elevated. Lungs: Coarse bilaterally to auscultation without wheezes, rales, or rhonchi. Breathing is unlabored. Heart: Irregularly irregular, modestly tachycardic, HR, with S1 S2. No murmurs, rubs, or gallops appreciated. Abdomen: Soft, non-tender, non-distended with normoactive bowel sounds. No hepatomegaly. No rebound/guarding. No obvious abdominal masses. Msk:  Strength and tone appear normal for age. Extremities: No clubbing or cyanosis. No edema.  Distal pedal pulses are 2+ and equal bilaterally. Neuro: Alert and oriented X 3. No facial asymmetry. No focal deficit. Moves all extremities spontaneously. Psych:  Responds to questions appropriately with a normal affect.    Assessment and Plan:   1. Dyspnea, likely multifactorial - likely due to AF/CHF +/- contribution from other process.  Possibility of infectious etiologies and interstitial fibrosis are being evaluated. Suspect CXR more likely reflective of COPD with edema. Agree with cycling troponins. Lipid panel in AM for risk stratification.  2. Newly recognized atrial fibrillation - unclear duration, but SOB x at least 4 days. CHADSVASC tentatively 2 for HTN/age, possibly 3 if you count CHF, warranting anticoagulation. Before committing to oral anticoagulation will need to cycle troponins and make sure no further procedures are warranted (such as cath if Bruce Cohen were to rule in or have low EF). 2D Echo. Continue IV diltiazem for now. Suspect rate will improve with diuresis but we can consider rebolusing with additional IV diltiazem and titrate drip if needed.  3. Probable acute congestive heart failure, unclear whether systolic or diastolic - check 2D echo when HR is a little slower. This is still under Sign/Held so will likely not be done until tomorrow anyway. Agree with IV diuresis and monitoring of renal function.   4. Ongoing tobacco abuse - counseled regarding cessation.  5. Possible excess alcohol consumption - the patient has not revealed this to [place] but Bruce Cohen wife told our office nurse Bruce Cohen drinks a lot. I have notified IM in case they want to consider CIWA.  6. Hypertension, recently noncompliant with meds - importance of med compliance reinforced. Follow BP with addition of medications.  7. Remote history of pericarditis - without recent recurrence.  Signed, Melina Copa PA-C 09/29/2014, 4:01 PM  Patient seen and examined. I agree with the assessment and plan as detailed above. See also my additional thoughts below.   The patient has atrial fibrillation with a rapid rate. We really do not know when it started. Bruce Cohen does not sense the atrial fibrillation. Bruce Cohen has shortness of breath with evidence of congestive heart failure. There is mention by the family that there may be excess alcohol intake. Bruce Cohen does not talk about this. We  will need to assess Bruce Cohen LV function. Bruce Cohen rate is to be controlled with diltiazem. Bruce Cohen will be diuresed. Heparin is being used until we have further decisions about how aggressive the cardiac workup will be. Ultimately Bruce Cohen will need to be anticoagulated with oral anticoagulant, if it is felt to be safe. 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

## 2014-09-29 NOTE — Progress Notes (Signed)
Pt reported his weight at 185, weight actually is 208 lbs, significantly above baseline.  Rosaria Ferries, PA-C 09/29/2014 5:00 PM Beeper [phone]

## 2014-09-29 NOTE — Progress Notes (Addendum)
ANTICOAGULATION CONSULT NOTE - Initial Consult  Pharmacy Consult:  Heparin Indication: atrial fibrillation  No Known Allergies  Patient Measurements: Height: 6\' 2"  (188 cm) Weight: 185 lb (83.915 kg) IBW/kg (Calculated) : 82.2 Heparin Dosing Weight: 84 kg  Vital Signs: Temp: 97.6 F (36.4 C) (03/21 1046) Temp Source: Oral (03/21 1046) BP: 158/116 mmHg (03/21 1151) Pulse Rate: 74 (03/21 1151)  Labs:  Recent Labs  09/29/14 1125  HGB 14.0  HCT 41.7  PLT 337  LABPROT 15.9*  INR 1.25  CREATININE 1.10    Estimated Creatinine Clearance: 68.5 mL/min (by C-G formula based on Cr of 1.1).   Medical History: History reviewed. No pertinent past medical history.    Assessment: 10 YOM presented with worsening SOB and found to be in Afib with RVR.  Pharmacy consulted to initiate IV heparin.  Baseline labs and home meds reviewed.   Goal of Therapy:  Heparin level 0.3-0.7 units/ml Monitor platelets by anticoagulation protocol: Yes    Plan:  - Heparin 4000 units IV bolus x 1, then - Heparin gtt at 1200 units/hr - Check 8 hr HL - Daily HL / CBC - F/U oral AC when possible   Elenor Wildes D. Mina Marble, PharmD, BCPS Pager:  [phone] 09/29/2014, 12:17 PM    ===========================   Addendum: - add LVQ for CAP   Goal of Therapy: Resolution of infection   Plan: - Levaquin 750mg  IV Q24H - D/C Rocephin and azithromycin as meds have not been given, per discussion with Lissa Merlin, NP - Monitor renal fxn, clinical progress    Calisa Luckenbaugh D. Mina Marble, PharmD, BCPS Pager:  [phone] 09/29/2014, 12:20 PM

## 2014-09-30 DIAGNOSIS — J189 Pneumonia, unspecified organism: Secondary | ICD-10-CM | POA: Insufficient documentation

## 2014-09-30 DIAGNOSIS — N182 Chronic kidney disease, stage 2 (mild): Secondary | ICD-10-CM

## 2014-09-30 DIAGNOSIS — I509 Heart failure, unspecified: Secondary | ICD-10-CM

## 2014-09-30 DIAGNOSIS — Z720 Tobacco use: Secondary | ICD-10-CM

## 2014-09-30 DIAGNOSIS — J810 Acute pulmonary edema: Secondary | ICD-10-CM

## 2014-09-30 DIAGNOSIS — N183 Chronic kidney disease, stage 3 (moderate): Secondary | ICD-10-CM

## 2014-09-30 LAB — LIPID PANEL
CHOL/HDL RATIO: 3.9 ratio
Cholesterol: 116 mg/dL (ref 0–200)
HDL: 30 mg/dL — AB (ref >39)
LDL CALC: 69 mg/dL (ref 0–99)
Triglycerides: 87 mg/dL (ref <150)
VLDL: 17 mg/dL (ref 0–40)

## 2014-09-30 LAB — LEGIONELLA ANTIGEN, URINE

## 2014-09-30 LAB — COMPREHENSIVE METABOLIC PANEL
ALK PHOS: 77 U/L (ref 39–117)
ALT: 9 U/L (ref 0–53)
AST: 13 U/L (ref 0–37)
Albumin: 3.2 g/dL — ABNORMAL LOW (ref 3.5–5.2)
Anion gap: 8 (ref 5–15)
BUN: 16 mg/dL (ref 6–23)
CO2: 28 mmol/L (ref 19–32)
Calcium: 8.7 mg/dL (ref 8.4–10.5)
Chloride: 102 mmol/L (ref 96–112)
Creatinine, Ser: 1.22 mg/dL (ref 0.50–1.35)
GFR, EST AFRICAN AMERICAN: 66 mL/min — AB (ref >90)
GFR, EST NON AFRICAN AMERICAN: 57 mL/min — AB (ref >90)
GLUCOSE: 102 mg/dL — AB (ref 70–99)
Potassium: 3.4 mmol/L — ABNORMAL LOW (ref 3.5–5.1)
Sodium: 138 mmol/L (ref 135–145)
TOTAL PROTEIN: 6.9 g/dL (ref 6.0–8.3)
Total Bilirubin: 1.2 mg/dL (ref 0.3–1.2)

## 2014-09-30 LAB — CBC
HEMATOCRIT: 40.4 % (ref 39.0–52.0)
Hemoglobin: 13.5 g/dL (ref 13.0–17.0)
MCH: 31 pg (ref 26.0–34.0)
MCHC: 33.4 g/dL (ref 30.0–36.0)
MCV: 92.7 fL (ref 78.0–100.0)
Platelets: 346 10*3/uL (ref 150–400)
RBC: 4.36 MIL/uL (ref 4.22–5.81)
RDW: 14 % (ref 11.5–15.5)
WBC: 9.1 10*3/uL (ref 4.0–10.5)

## 2014-09-30 LAB — INFLUENZA PANEL BY PCR (TYPE A & B)
H1N1 flu by pcr: NOT DETECTED
INFLBPCR: NEGATIVE
Influenza A By PCR: NEGATIVE

## 2014-09-30 LAB — APTT: aPTT: 69 seconds — ABNORMAL HIGH (ref 24–37)

## 2014-09-30 LAB — HIV ANTIBODY (ROUTINE TESTING W REFLEX): HIV Screen 4th Generation wRfx: NONREACTIVE

## 2014-09-30 LAB — HEPARIN LEVEL (UNFRACTIONATED): Heparin Unfractionated: 0.12 IU/mL — ABNORMAL LOW (ref 0.30–0.70)

## 2014-09-30 LAB — PROTIME-INR
INR: 1.25 (ref 0.00–1.49)
Prothrombin Time: 15.8 seconds — ABNORMAL HIGH (ref 11.6–15.2)

## 2014-09-30 LAB — TROPONIN I: Troponin I: 0.06 ng/mL — ABNORMAL HIGH (ref <0.031)

## 2014-09-30 MED ORDER — AMLODIPINE BESYLATE 5 MG PO TABS
5.0000 mg | ORAL_TABLET | Freq: Every day | ORAL | Status: DC
  Administered 2014-09-30 – 2014-10-01 (×2): 5 mg via ORAL
  Filled 2014-09-30 (×2): qty 1

## 2014-09-30 MED ORDER — CETYLPYRIDINIUM CHLORIDE 0.05 % MT LIQD
7.0000 mL | Freq: Two times a day (BID) | OROMUCOSAL | Status: DC
  Administered 2014-09-30 – 2014-10-03 (×4): 7 mL via OROMUCOSAL

## 2014-09-30 MED ORDER — OFF THE BEAT BOOK
Freq: Once | Status: AC
  Administered 2014-09-30: 15:00:00
  Filled 2014-09-30: qty 1

## 2014-09-30 MED ORDER — POTASSIUM CHLORIDE CRYS ER 20 MEQ PO TBCR
30.0000 meq | EXTENDED_RELEASE_TABLET | Freq: Once | ORAL | Status: AC
  Administered 2014-09-30: 30 meq via ORAL
  Filled 2014-09-30 (×2): qty 1

## 2014-09-30 MED ORDER — SODIUM CHLORIDE 0.9 % IV SOLN
INTRAVENOUS | Status: DC
  Administered 2014-10-01: 04:00:00 via INTRAVENOUS

## 2014-09-30 MED ORDER — HEPARIN BOLUS VIA INFUSION
2000.0000 [IU] | Freq: Once | INTRAVENOUS | Status: AC
  Administered 2014-09-30: 2000 [IU] via INTRAVENOUS
  Filled 2014-09-30: qty 2000

## 2014-09-30 MED ORDER — ASPIRIN 81 MG PO CHEW
81.0000 mg | CHEWABLE_TABLET | ORAL | Status: AC
  Administered 2014-10-01: 81 mg via ORAL
  Filled 2014-09-30: qty 1

## 2014-09-30 MED ORDER — HEPARIN BOLUS VIA INFUSION
3000.0000 [IU] | Freq: Once | INTRAVENOUS | Status: AC
  Administered 2014-09-30: 3000 [IU] via INTRAVENOUS
  Filled 2014-09-30: qty 3000

## 2014-09-30 NOTE — Progress Notes (Signed)
ANTICOAGULATION CONSULT NOTE - Follow Up Consult  Pharmacy Consult for Heparin  Indication: atrial fibrillation  No Known Allergies  Patient Measurements: Height: 6\' 2"  (188 cm) Weight: 202 lb 9.6 oz (91.899 kg) IBW/kg (Calculated) : 82.2 Vital Signs: Temp: 97.8 F (36.6 C) (03/22 1135) Temp Source: Oral (03/22 1135) BP: 133/87 mmHg (03/22 1135) Pulse Rate: 100 (03/22 1200)  Labs:  Recent Labs  09/29/14 1125 09/29/14 1827 09/29/14 2043 09/29/14 2235 09/30/14 0423 09/30/14 1441  HGB 14.0  --   --   --  13.5  --   HCT 41.7  --   --   --  40.4  --   PLT 337  --   --   --  346  --   APTT  --   --   --   --  69*  --   LABPROT 15.9*  --   --   --  15.8*  --   INR 1.25  --   --   --  1.25  --   HEPARINUNFRC  --   --  <0.10*  --  <0.10* 0.12*  CREATININE 1.10  --   --   --  1.22  --   TROPONINI  --  0.05*  --  0.06* 0.06*  --     Estimated Creatinine Clearance: 61.8 mL/min (by C-G formula based on Cr of 1.22).  Assessment: 74 yo male with afib on heparin and heparin level ins 0.12 after heparin bolus and increase to 1700 units/hr.  Plans noted for oral anticoagulation post cath.  Goal of Therapy:  Heparin level 0.3-0.7 units/ml Monitor platelets by anticoagulation protocol: Yes   Plan:  -Heparin bolus 2000 units then increase infusion to 2000 units/hr -Heparin level in 8 hrs -Daily heparin level and CBC  Hildred Laser, Pharm D 09/30/2014 3:47 PM

## 2014-09-30 NOTE — Progress Notes (Addendum)
Patient Name: EBERARDO DEMELLO Date of Encounter: 09/30/2014     Active Problems:   Acute respiratory failure with hypoxia   Pulmonary edema, acute   HTN (hypertension)   Patient nonadherence   COPD (chronic obstructive pulmonary disease)   Tobacco abuse   CKD (chronic kidney disease), stage II   Acute CHF   Atrial fibrillation with RVR    SUBJECTIVE  Complaints of SOB at rest and with exertion but feels it has gotten much better since yesterday. Denies CP.   CURRENT MEDS . antiseptic oral rinse  7 mL Mouth Rinse BID  . aspirin EC  81 mg Oral Daily  . docusate sodium  100 mg Oral BID  . folic acid  1 mg Oral Daily  . furosemide  40 mg Intravenous BID  . levofloxacin (LEVAQUIN) IV  750 mg Intravenous Q24H  . LORazepam  0-4 mg Intravenous Q6H   Followed by  . [START ON 10/01/2014] LORazepam  0-4 mg Intravenous Q12H  . multivitamin with minerals  1 tablet Oral Daily  . rosuvastatin  20 mg Oral q1800  . sodium chloride  3 mL Intravenous Q12H  . thiamine  100 mg Oral Daily   Or  . thiamine  100 mg Intravenous Daily    OBJECTIVE  Filed Vitals:   09/30/14 0006 09/30/14 0406 09/30/14 0600 09/30/14 0754  BP: 138/96 132/98  149/98  Pulse: 99 97  97  Temp:  97.6 F (36.4 C)  98.1 F (36.7 C)  TempSrc:  Oral  Oral  Resp:      Height:      Weight:   202 lb 9.6 oz (91.899 kg)   SpO2: 91% 97%  99%    Intake/Output Summary (Last 24 hours) at 09/30/14 1021 Last data filed at 09/30/14 0900  Gross per 24 hour  Intake    340 ml  Output   2925 ml  Net  -2585 ml   Filed Weights   09/29/14 1151 09/29/14 1656 09/30/14 0600  Weight: 185 lb (83.915 kg) 208 lb 6.4 oz (94.53 kg) 202 lb 9.6 oz (91.899 kg)    PHYSICAL EXAM  General: Pleasant, NAD. Clammy to touch. Neuro: Alert and oriented X 3. Moves all extremities spontaneously. Psych: Normal affect. HEENT:  Normal  Neck: Supple without bruits or JVD. Lungs:  Resp regular and unlabored, CTA. Heart: Irregularly  irregular. no s3, s4, or murmurs. Abdomen: Soft, non-tender, non-distended, BS + x 4.  Extremities: No clubbing, cyanosis or edema. DP/PT/Radials 2+ and equal bilaterally.  Accessory Clinical Findings  CBC  Recent Labs  09/29/14 1125 09/30/14 0423  WBC 12.8* 9.1  HGB 14.0 13.5  HCT 41.7 40.4  MCV 93.5 92.7  PLT 337 330   Basic Metabolic Panel  Recent Labs  09/29/14 1125 09/29/14 2235 09/30/14 0423  NA 139  --  138  K 3.9  --  3.4*  CL 107  --  102  CO2 26  --  28  GLUCOSE 106*  --  102*  BUN 16  --  16  CREATININE 1.10  --  1.22  CALCIUM 8.4  --  8.7  MG  --  1.9  --    Liver Function Tests  Recent Labs  09/29/14 1125 09/30/14 0423  AST 13 13  ALT 11 9  ALKPHOS 84 77  BILITOT 1.2 1.2  PROT 7.1 6.9  ALBUMIN 3.4* 3.2*    Cardiac Enzymes  Recent Labs  09/29/14 1827 09/29/14 2235 09/30/14 0423  TROPONINI 0.05* 0.06* 0.06*    Fasting Lipid Panel  Recent Labs  09/30/14 0423  CHOL 116  HDL 30*  LDLCALC 69  TRIG 87  CHOLHDL 3.9   Thyroid Function Tests  Recent Labs  09/29/14 1250  TSH 2.081    TELE  afib with HRs in low 100s   Radiology/Studies  Dg Chest 2 View  09/29/2014   CLINICAL DATA:  Shortness of breath for 5 days.  EXAM: CHEST  2 VIEW  COMPARISON:  None.  FINDINGS: There is a degree of underlying emphysema. There is widespread interstitial opacity throughout the lungs diffusely. There is some patchy airspace consolidation in the upper lobes, more on the right than on the left. The heart is borderline enlarged with pulmonary vascularity within normal limits. No effusions. No adenopathy. No bone lesions.  IMPRESSION: Question a degree of congestive heart failure superimposed on emphysema. All of the changes which are present may represent interstitial fibrosis as opposed to edema. Without prior studies, this differentiation is difficult. Patchy opacity in the upper lobes potentially could also represent a degree of superimposed  pneumonia. Given these changes, a followup study in 5-7 days is advised to assess for stability.   Electronically Signed   By: Lowella Grip III M.D.   On: 09/29/2014 11:44   Ct Chest High Resolution  09/29/2014   CLINICAL DATA:  74 year old male with history of pulmonary fibrosis. Shortness of breath.  EXAM: CT CHEST WITHOUT CONTRAST  TECHNIQUE: Multidetector CT imaging of the chest was performed following the standard protocol without intravenous contrast. High resolution imaging of the lungs, as well as inspiratory and expiratory imaging, was performed.  COMPARISON:  No priors.  FINDINGS: Mediastinum/Lymph Nodes: Heart size is mildly enlarged. There is no significant pericardial fluid, thickening or pericardial calcification. Extensive calcifications of the mitral annulus inferiorly. There is atherosclerosis of the thoracic aorta, the great vessels of the mediastinum and the coronary arteries, including calcified atherosclerotic plaque in the left anterior descending and right coronary arteries. Multiple borderline enlarged and mildly enlarged mediastinal and hilar lymph nodes bilaterally, measuring up to 17 mm in short axis in the low right paratracheal station. Hilar lymphadenopathy is poorly evaluated on today's non contrast CT examination. Esophagus is unremarkable in appearance. No axillary lymphadenopathy.  Lungs/Pleura: High-resolution images demonstrate patchy areas of ground-glass attenuation, septal thickening and some scattered areas of subpleural reticulation. Study is limited by considerable patient respiratory motion. There is mild diffuse bronchial wall thickening. No definite bronchiectasis. No honeycombing. No clear cut craniocaudal gradient to these findings, with greater extent of ground-glass attenuation in the apices. No confluent consolidative airspace disease. Inspiratory and expiratory imaging is unremarkable. Small bilateral pleural effusions (right greater than left) layering  dependently.  Upper Abdomen: 1.9 cm low-attenuation lesion in the lateral aspect of segment 3 of the liver is incompletely characterized, but statistically likely a small cyst.  Musculoskeletal/Soft Tissues: There are no aggressive appearing lytic or blastic lesions noted in the visualized portions of the skeleton.  IMPRESSION: 1. The appearance of the lungs is not strongly favored to reflect interstitial lung disease. Rather, these findings are favored to reflect interstitial pulmonary edema in the setting of congestive heart failure. Clinical correlation is suggested. If there is persistent clinical concern for interstitial lung disease, repeat high-resolution chest CT could be performed when the patient is not acutely ill and better able to hold his breath in the next 2-3 months. Other differential considerations would include atypical multilobar pneumonia, however, that is not  strongly favored. Clinical correlation is suggested. 2. Small bilateral pleural effusions (right greater than left) layering dependently. 3. Mediastinal and bilateral hilar lymphadenopathy is nonspecific. This could be seen in the setting of congestive failure, infection, and/or interstitial lung disease. Attention at time of repeat high-resolution chest CT is suggested if of clinical concern. 4. Atherosclerosis, including two vessel coronary artery disease. Assessment for potential risk factor modification, dietary therapy or pharmacologic therapy may be warranted, if clinically indicated.   Electronically Signed   By: Vinnie Langton M.D.   On: 09/29/2014 16:47    ASSESSMENT AND PLAN 74 y.o. Male with HTN, LVH, HLD, recurrent pericarditis, and 60-years of tobacco use who presented [facility] ED on 09/29/14 with a 4 day history of dyspnea and newly recognized AF RVR   New onset atrial fibrillation with RVR -- Currently in A. Fib. HRs currently HRs in low 100s. Continue dilt gtt at 10 mg/hr.  -- CHADSVASC at least 3 ( HTN 1, AGE 41, +/-  CHF 1). Will likely need long term AC, but currently on hold as we are planning to do a LHC tomorrow. Continue heparin gtt.   Hypertension -- BP slightly elevated still. Will resume home norvasc 5mg     Acute CHF- pulm edema on CXR and CT chest. BNP mildly elevated at 745 -- Still with SOB at rest. Continue IV lasix 40mg  BID. Net neg 2.5L. Creat stable for now. Continue to monitor  -- Euvolemic on exam  -- 2D Echo pending.   NSTEMI- troponin mildly elevated at 0.06. Continue to cycle -- Continue heparin gtt  -- Will plan for [facility] tomorrow. NPO after midnight.   Tobacco abuse -- States he quit smoking prior to this admission -- Encouraged cessation  Hypokalemia- mild. Replete per IM.  Recurrent pericarditis- per patient report. CT with no signs of constrictive pericarditis.   Judy Pimple PA-C  Pager [phone]  Patient seen and examined and history reviewed. Agree with above findings and plan. 74 yo WM admitted with new onset atrial fibrillation, CHF, and elevated troponins. He did have chest pressure associated with his dyspnea. He does have coronary calcification on CT. I have recommended LHC to assess for ischemic risk. Echo is pending to assess whether he has systolic or diastolic dysfunction. I reviewed his CT and his pericardium is thin so I think constriction is unlikely although he has a history of recurrent pericarditis. Once ischemic evaluation is complete we can decide on anticoagulation. Will ask case management to assess coverage of NOAC.   Jacquelyne Quarry [place], [place] 09/30/2014 12:03 PM

## 2014-09-30 NOTE — Care Management Note (Signed)
    Page 1 of 2   10/03/2014     10:35:33 AM CARE MANAGEMENT NOTE 10/03/2014  Patient:  Bruce Cohen, Bruce Cohen   Account Number:  [id number]  Date Initiated:  09/30/2014  Documentation initiated by:  GRAVES-BIGELOW,Jhovanny Guinta  Subjective/Objective Assessment:   Pt admitted for afib. Initiated on Cardizem gtt.     Action/Plan:   CM received referral for anticoagulation therapy. Benefits check in process. Will make pt aware once completed.   Anticipated DC Date:  10/01/2014   Anticipated DC Plan:  [place]  CM consult      Choice offered to / List presented to:             Status of service:  Completed, signed off Medicare Important Message given?  YES (If response is "NO", the following Medicare IM given date fields will be blank) Date Medicare IM given:  10/03/2014 Medicare IM given by:  GRAVES-BIGELOW,Rendi Mapel Date Additional Medicare IM given:   Additional Medicare IM given by:    Discharge Disposition:  HOME/SELF CARE  Per UR Regulation:  Reviewed for med. necessity/level of care/duration of stay  If discussed at [place] of Stay Meetings, dates discussed:    Comments:  10-03-14 [place], RN,BSN [phone] Pt will have primary physician call for prior authorization for medication eliquis. CM did call to [facility] to make sure available- and Eliquis is in stock. CM did provide pt with 30 day free card. Rx will need to be written for 30 day free. No further needs from CM at this time.   DILTIAZEM TABLET 60 MG  4 X A DAY  COVER- YES CO-PAY - $ 45.00  30 DAY SUPPLY TIER- 3 DRUG PRIOR APPROVAL- NO PHARMACY- [facility],  [facility], [facility], [facility]  AND [facility]  CARDIZEM 60 MG  TABLET 4 X A DAY COVER- YES CO-PAY- $ 8.00  30 DAY SUPPLY TIER- 2 DRUG PRIOR APPROVAL- NO PHARMACY : SAME AS ABOVE  Jacqlyn Krauss, RN,BSN 10-01-14 1151    Benefits check for Eliquis: COVER- YES CO-PAY -  $45.00  30 DAY SUPPLY  PER [place] TIER- 3 DRUG PRIOR APPROVAL - YES # [phone]      FAX # [phone] PHARMACY - [facility] ON [address]  CM has Benefits check in for Cardizem po as well. Will make pt aware of cost once completed. Jacqlyn Krauss, RN,BSN [phone]

## 2014-09-30 NOTE — Progress Notes (Signed)
[facility] PROGRESS NOTE   Bruce Cohen MWU:[id number] DOB: 09-19-1940 DOA: 09/29/2014 PCP: Henrine Screws, MD  HPI/Subjective: Still feels somewhat short of breath, denies excessive cough and sputum production. Denies any fever or chills.  Assessment/Plan: Active Problems:   Acute respiratory failure with hypoxia   Pulmonary edema, acute   HTN (hypertension)   Patient nonadherence   COPD (chronic obstructive pulmonary disease)   Tobacco abuse   CKD (chronic kidney disease), stage II   Acute CHF   Atrial fibrillation with RVR   Acute hypoxic respiratory failure Likely this is secondary to CHF. Atypical presentation of pneumonia/bronchitis couldn't be ruled out on admission. Flu is negative. Patient is afebrile or leukocytosis likely finding secondary to pulmonary edema/CHF.  Acute CHF X-ray/CT scan showed pulmonary edema and has mildly elevated BNP. Waiting 2-D echo results. Patient already started on 40 mg of IV Lasix twice a day.  Atrial fibrillation Patient is on heparin, CHADsVASc2 score of at least 3, [place] need anticoagulations outpatient. Rate is controlled with Cardizem drip. If turned out to have CHF/CAD might need to add beta blockers.  Elevated troponin Slightly elevated troponin at 0.06, patient is on IV heparin, beta blockers.  This could be secondary to CHF, atrial fibrillation or less likely acute chronic syndrome. Aspirin also started, seen by cardiology. Recommended left heart cath to be done in a.m. Hopefully patient will be able to stay flat.  CKD Likely CKD stage II to 3, presented with creatinine of 1.1, creatinine went up to 1.2 with diuresis. Watch creatinine closely, likely to increase with diuresis. Patient might need to have a creatinine in the high side to keep clear lungs.  COPD Patient smoked for about 40 years, about one pack per day. Patient is not wheezing, not having purulent sputum, no indication for antibiotics or  steroids. Patient started on levofloxacin in the ED, I will discontinue    hypokalemia Repleted with oral supplements.   Code Status: Full Code Family Communication: Plan discussed with the patient. Disposition Plan: Remains inpatient Diet: Diet Heart Diet NPO time specified Except for: Sips with Meds  Consultants:  None  Procedures:   None   Antibiotics:levofloxacin stopped on 3/22.    Objective: Filed Vitals:   09/30/14 1200  BP:   Pulse: 100  Temp:   Resp:     Intake/Output Summary (Last 24 hours) at 09/30/14 1324 Last data filed at 09/30/14 0900  Gross per 24 hour  Intake    340 ml  Output   2925 ml  Net  -2585 ml   Filed Weights   09/29/14 1151 09/29/14 1656 09/30/14 0600  Weight: 83.915 kg (185 lb) 94.53 kg (208 lb 6.4 oz) 91.899 kg (202 lb 9.6 oz)    Exam: General: Alert and awake, oriented x3, not in any acute distress. HEENT: anicteric sclera, pupils reactive to light and accommodation, EOMI CVS: S1-S2 clear, no murmur rubs or gallops Chest: clear to auscultation bilaterally, no wheezing, rales or rhonchi Abdomen: soft nontender, nondistended, normal bowel sounds, no organomegaly Extremities: no cyanosis, clubbing or edema noted bilaterally Neuro: Cranial nerves II-XII intact, no focal neurological deficits  Data Reviewed: Basic Metabolic Panel:  Recent Labs Lab 09/29/14 1125 09/29/14 2235 09/30/14 0423  NA 139  --  138  K 3.9  --  3.4*  CL 107  --  102  CO2 26  --  28  GLUCOSE 106*  --  102*  BUN 16  --  16  CREATININE 1.10  --  1.22  CALCIUM 8.4  --  8.7  MG  --  1.9  --    Liver Function Tests:  Recent Labs Lab 09/29/14 1125 09/30/14 0423  AST 13 13  ALT 11 9  ALKPHOS 84 77  BILITOT 1.2 1.2  PROT 7.1 6.9  ALBUMIN 3.4* 3.2*   No results for input(s): LIPASE, AMYLASE in the last 168 hours. No results for input(s): AMMONIA in the last 168 hours. CBC:  Recent Labs Lab 09/29/14 1125 09/30/14 0423  WBC 12.8* 9.1  HGB  14.0 13.5  HCT 41.7 40.4  MCV 93.5 92.7  PLT 337 346   Cardiac Enzymes:  Recent Labs Lab 09/29/14 1827 09/29/14 2235 09/30/14 0423  TROPONINI 0.05* 0.06* 0.06*   BNP (last 3 results)  Recent Labs  09/29/14 1125  BNP 745.9*    ProBNP (last 3 results) No results for input(s): PROBNP in the last 8760 hours.  CBG: No results for input(s): GLUCAP in the last 168 hours.  Micro Recent Results (from the past 240 hour(s))  Blood culture (routine x 2)     Status: None (Preliminary result)   Collection Time: 09/29/14 12:50 PM  Result Value Ref Range Status   Specimen Description BLOOD RIGHT ANTECUBITAL  Final   Special Requests BOTTLES DRAWN AEROBIC AND ANAEROBIC 10MLS  Final   Culture   Final           BLOOD CULTURE RECEIVED NO GROWTH TO DATE CULTURE WILL BE HELD FOR 5 DAYS BEFORE ISSUING A FINAL NEGATIVE REPORT Performed at [facility]    Report Status PENDING  Incomplete  Blood culture (routine x 2)     Status: None (Preliminary result)   Collection Time: 09/29/14  1:00 PM  Result Value Ref Range Status   Specimen Description BLOOD RIGHT HAND  Final   Special Requests BOTTLES DRAWN AEROBIC AND ANAEROBIC 5CC  Final   Culture   Final           BLOOD CULTURE RECEIVED NO GROWTH TO DATE CULTURE WILL BE HELD FOR 5 DAYS BEFORE ISSUING A FINAL NEGATIVE REPORT Performed at [facility]    Report Status PENDING  Incomplete  MRSA PCR Screening     Status: None   Collection Time: 09/29/14  5:17 PM  Result Value Ref Range Status   MRSA by PCR NEGATIVE NEGATIVE Final    Comment:        The GeneXpert MRSA Assay (FDA approved for NASAL specimens only), is one component of a comprehensive MRSA colonization surveillance program. It is not intended to diagnose MRSA infection nor to guide or monitor treatment for MRSA infections.      Studies: Dg Chest 2 View  09/29/2014   CLINICAL DATA:  Shortness of breath for 5 days.  EXAM: CHEST  2 VIEW  COMPARISON:   None.  FINDINGS: There is a degree of underlying emphysema. There is widespread interstitial opacity throughout the lungs diffusely. There is some patchy airspace consolidation in the upper lobes, more on the right than on the left. The heart is borderline enlarged with pulmonary vascularity within normal limits. No effusions. No adenopathy. No bone lesions.  IMPRESSION: Question a degree of congestive heart failure superimposed on emphysema. All of the changes which are present may represent interstitial fibrosis as opposed to edema. Without prior studies, this differentiation is difficult. Patchy opacity in the upper lobes potentially could also represent a degree of superimposed pneumonia. Given these changes, a followup study in 5-7 days  is advised to assess for stability.   Electronically Signed   By: Lowella Grip III M.D.   On: 09/29/2014 11:44   Ct Chest High Resolution  09/29/2014   CLINICAL DATA:  74 year old male with history of pulmonary fibrosis. Shortness of breath.  EXAM: CT CHEST WITHOUT CONTRAST  TECHNIQUE: Multidetector CT imaging of the chest was performed following the standard protocol without intravenous contrast. High resolution imaging of the lungs, as well as inspiratory and expiratory imaging, was performed.  COMPARISON:  No priors.  FINDINGS: Mediastinum/Lymph Nodes: Heart size is mildly enlarged. There is no significant pericardial fluid, thickening or pericardial calcification. Extensive calcifications of the mitral annulus inferiorly. There is atherosclerosis of the thoracic aorta, the great vessels of the mediastinum and the coronary arteries, including calcified atherosclerotic plaque in the left anterior descending and right coronary arteries. Multiple borderline enlarged and mildly enlarged mediastinal and hilar lymph nodes bilaterally, measuring up to 17 mm in short axis in the low right paratracheal station. Hilar lymphadenopathy is poorly evaluated on today's non contrast  CT examination. Esophagus is unremarkable in appearance. No axillary lymphadenopathy.  Lungs/Pleura: High-resolution images demonstrate patchy areas of ground-glass attenuation, septal thickening and some scattered areas of subpleural reticulation. Study is limited by considerable patient respiratory motion. There is mild diffuse bronchial wall thickening. No definite bronchiectasis. No honeycombing. No clear cut craniocaudal gradient to these findings, with greater extent of ground-glass attenuation in the apices. No confluent consolidative airspace disease. Inspiratory and expiratory imaging is unremarkable. Small bilateral pleural effusions (right greater than left) layering dependently.  Upper Abdomen: 1.9 cm low-attenuation lesion in the lateral aspect of segment 3 of the liver is incompletely characterized, but statistically likely a small cyst.  Musculoskeletal/Soft Tissues: There are no aggressive appearing lytic or blastic lesions noted in the visualized portions of the skeleton.  IMPRESSION: 1. The appearance of the lungs is not strongly favored to reflect interstitial lung disease. Rather, these findings are favored to reflect interstitial pulmonary edema in the setting of congestive heart failure. Clinical correlation is suggested. If there is persistent clinical concern for interstitial lung disease, repeat high-resolution chest CT could be performed when the patient is not acutely ill and better able to hold his breath in the next 2-3 months. Other differential considerations would include atypical multilobar pneumonia, however, that is not strongly favored. Clinical correlation is suggested. 2. Small bilateral pleural effusions (right greater than left) layering dependently. 3. Mediastinal and bilateral hilar lymphadenopathy is nonspecific. This could be seen in the setting of congestive failure, infection, and/or interstitial lung disease. Attention at time of repeat high-resolution chest CT is  suggested if of clinical concern. 4. Atherosclerosis, including two vessel coronary artery disease. Assessment for potential risk factor modification, dietary therapy or pharmacologic therapy may be warranted, if clinically indicated.   Electronically Signed   By: Vinnie Langton M.D.   On: 09/29/2014 16:47    Scheduled Meds: . amLODipine  5 mg Oral Daily  . antiseptic oral rinse  7 mL Mouth Rinse BID  . [START ON 10/01/2014] aspirin  81 mg Oral Pre-Cath  . aspirin EC  81 mg Oral Daily  . docusate sodium  100 mg Oral BID  . folic acid  1 mg Oral Daily  . furosemide  40 mg Intravenous BID  . levofloxacin (LEVAQUIN) IV  750 mg Intravenous Q24H  . LORazepam  0-4 mg Intravenous Q6H   Followed by  . [START ON 10/01/2014] LORazepam  0-4 mg Intravenous Q12H  .  multivitamin with minerals  1 tablet Oral Daily  . rosuvastatin  20 mg Oral q1800  . sodium chloride  3 mL Intravenous Q12H  . thiamine  100 mg Oral Daily   Or  . thiamine  100 mg Intravenous Daily   Continuous Infusions: . [START ON 10/01/2014] sodium chloride    . diltiazem (CARDIZEM) infusion 10 mg/hr (09/30/14 1214)  . heparin 1,700 Units/hr (09/30/14 5456)       Time spent: 35 minutes    [facility]  [facility] Pager [phone] If 7PM-7AM, please contact night-coverage at [email or web address], password [facility] 09/30/2014, 1:24 PM  LOS: 1 day

## 2014-09-30 NOTE — Progress Notes (Signed)
ANTICOAGULATION CONSULT NOTE - Follow Up Consult  Pharmacy Consult for Heparin  Indication: atrial fibrillation  No Known Allergies  Patient Measurements: Height: 6\' 2"  (188 cm) Weight: 208 lb 6.4 oz (94.53 kg) IBW/kg (Calculated) : 82.2 Vital Signs: Temp: 97.5 F (36.4 C) (03/21 2000) Temp Source: Oral (03/21 2000) BP: 132/98 mmHg (03/22 0406) Pulse Rate: 97 (03/22 0406)  Labs:  Recent Labs  09/29/14 1125 09/29/14 1827 09/29/14 2043 09/29/14 2235 09/30/14 0423  HGB 14.0  --   --   --  13.5  HCT 41.7  --   --   --  40.4  PLT 337  --   --   --  346  APTT  --   --   --   --  69*  LABPROT 15.9*  --   --   --  15.8*  INR 1.25  --   --   --  1.25  HEPARINUNFRC  --   --  <0.10*  --  <0.10*  CREATININE 1.10  --   --   --   --   TROPONINI  --  0.05*  --  0.06*  --     Estimated Creatinine Clearance: 68.5 mL/min (by C-G formula based on Cr of 1.1).  Assessment: Undetectable heparin level despite rate increase, other labs as above, infusing ok per RN.   Goal of Therapy:  Heparin level 0.3-0.7 units/ml Monitor platelets by anticoagulation protocol: Yes   Plan:  -Heparin 3000 units BOLUS -Increase heparin drip to 1700 units/hr -1400 HL -Daily CBC/HL -Monitor for bleeding  Narda Bonds 09/30/2014,6:03 AM

## 2014-09-30 NOTE — Progress Notes (Signed)
UR Completed Janki Dike Graves-Bigelow, RN,BSN [phone]  

## 2014-10-01 ENCOUNTER — Encounter (HOSPITAL_COMMUNITY)
Admission: EM | Disposition: A | Discharge status: Home / Self Care | Payer: Self-pay | Source: Home / Self Care | Attending: Family Medicine

## 2014-10-01 ENCOUNTER — Encounter (HOSPITAL_COMMUNITY): Payer: Self-pay | Admitting: Cardiovascular Disease

## 2014-10-01 DIAGNOSIS — I429 Cardiomyopathy, unspecified: Secondary | ICD-10-CM

## 2014-10-01 DIAGNOSIS — I4891 Unspecified atrial fibrillation: Secondary | ICD-10-CM

## 2014-10-01 DIAGNOSIS — R0602 Shortness of breath: Secondary | ICD-10-CM

## 2014-10-01 HISTORY — PX: LEFT HEART CATHETERIZATION WITH CORONARY ANGIOGRAM: SHX5451

## 2014-10-01 LAB — CBC
HCT: 40.5 % (ref 39.0–52.0)
Hemoglobin: 13.6 g/dL (ref 13.0–17.0)
MCH: 31.1 pg (ref 26.0–34.0)
MCHC: 33.6 g/dL (ref 30.0–36.0)
MCV: 92.5 fL (ref 78.0–100.0)
PLATELETS: 341 10*3/uL (ref 150–400)
RBC: 4.38 MIL/uL (ref 4.22–5.81)
RDW: 14 % (ref 11.5–15.5)
WBC: 8.5 10*3/uL (ref 4.0–10.5)

## 2014-10-01 LAB — HEPARIN LEVEL (UNFRACTIONATED)
HEPARIN UNFRACTIONATED: 0.23 [IU]/mL — AB (ref 0.30–0.70)
Heparin Unfractionated: 0.44 IU/mL (ref 0.30–0.70)

## 2014-10-01 LAB — BASIC METABOLIC PANEL
ANION GAP: 7 (ref 5–15)
BUN: 15 mg/dL (ref 6–23)
CALCIUM: 8.7 mg/dL (ref 8.4–10.5)
CO2: 26 mmol/L (ref 19–32)
CREATININE: 1.16 mg/dL (ref 0.50–1.35)
Chloride: 103 mmol/L (ref 96–112)
GFR, EST AFRICAN AMERICAN: 70 mL/min — AB (ref >90)
GFR, EST NON AFRICAN AMERICAN: 60 mL/min — AB (ref >90)
Glucose, Bld: 95 mg/dL (ref 70–99)
Potassium: 4.2 mmol/L (ref 3.5–5.1)
Sodium: 136 mmol/L (ref 135–145)

## 2014-10-01 SURGERY — LEFT HEART CATHETERIZATION WITH CORONARY ANGIOGRAM

## 2014-10-01 MED ORDER — HEPARIN BOLUS VIA INFUSION
2500.0000 [IU] | Freq: Once | INTRAVENOUS | Status: AC
  Administered 2014-10-01: 2500 [IU] via INTRAVENOUS
  Filled 2014-10-01: qty 2500

## 2014-10-01 MED ORDER — LIDOCAINE HCL (PF) 1 % IJ SOLN
INTRAMUSCULAR | Status: AC
  Filled 2014-10-01: qty 30

## 2014-10-01 MED ORDER — MIDAZOLAM HCL 2 MG/2ML IJ SOLN
INTRAMUSCULAR | Status: AC
  Filled 2014-10-01: qty 2

## 2014-10-01 MED ORDER — VERAPAMIL HCL 2.5 MG/ML IV SOLN
INTRAVENOUS | Status: AC
  Filled 2014-10-01: qty 2

## 2014-10-01 MED ORDER — HEPARIN (PORCINE) IN NACL 2-0.9 UNIT/ML-% IJ SOLN
INTRAMUSCULAR | Status: AC
  Filled 2014-10-01: qty 1000

## 2014-10-01 MED ORDER — FENTANYL CITRATE 0.05 MG/ML IJ SOLN
INTRAMUSCULAR | Status: AC
  Filled 2014-10-01: qty 2

## 2014-10-01 MED ORDER — DILTIAZEM HCL 60 MG PO TABS
60.0000 mg | ORAL_TABLET | Freq: Four times a day (QID) | ORAL | Status: DC
  Administered 2014-10-01: 60 mg via ORAL
  Filled 2014-10-01: qty 1

## 2014-10-01 MED ORDER — METOPROLOL TARTRATE 25 MG PO TABS
25.0000 mg | ORAL_TABLET | Freq: Four times a day (QID) | ORAL | Status: DC
  Administered 2014-10-01 – 2014-10-02 (×3): 25 mg via ORAL
  Filled 2014-10-01 (×3): qty 1

## 2014-10-01 MED ORDER — FUROSEMIDE 40 MG PO TABS
40.0000 mg | ORAL_TABLET | Freq: Every day | ORAL | Status: DC
  Administered 2014-10-02 – 2014-10-03 (×2): 40 mg via ORAL
  Filled 2014-10-01 (×2): qty 1

## 2014-10-01 MED ORDER — SODIUM CHLORIDE 0.9 % IV SOLN
INTRAVENOUS | Status: AC
  Administered 2014-10-01: 16:00:00 via INTRAVENOUS

## 2014-10-01 MED ORDER — APIXABAN 5 MG PO TABS
5.0000 mg | ORAL_TABLET | Freq: Two times a day (BID) | ORAL | Status: DC
  Administered 2014-10-02 – 2014-10-03 (×3): 5 mg via ORAL
  Filled 2014-10-01 (×3): qty 1

## 2014-10-01 MED ORDER — HEPARIN SODIUM (PORCINE) 1000 UNIT/ML IJ SOLN
INTRAMUSCULAR | Status: AC
  Filled 2014-10-01: qty 1

## 2014-10-01 NOTE — Interval H&P Note (Signed)
History and Physical Interval Note:  10/01/2014 2:22 PM  Bruce Cohen  has presented today for surgery, with the diagnosis of cp  The various methods of treatment have been discussed with the patient and family. After consideration of risks, benefits and other options for treatment, the patient has consented to  Procedure(s): LEFT HEART CATHETERIZATION WITH CORONARY ANGIOGRAM (N/A) as a surgical intervention .  The patient's history has been reviewed, patient examined, no change in status, stable for surgery.  I have reviewed the patient's chart and labs.  Questions were answered to the patient's satisfaction.     Kathlyn Sacramento

## 2014-10-01 NOTE — CV Procedure (Signed)
   Cardiac Catheterization Procedure Note  Name: Bruce Cohen MRN: [id number] DOB: 02-08-1941  Procedure: Left Heart Cath, Selective Coronary Angiography, LV angiography  Indication:  Congestive heart failure with newly diagnosed cardiomyopathy in the setting of atrial fibrillation.  Medications:  Sedation:  1 mg IV Versed, 25 mcg IV Fentanyl  Contrast:  80 mL Omnipaque   Procedural Details: The right wrist was prepped, draped, and anesthetized with 1% lidocaine. Using the modified Seldinger technique, a 5 French sheath was introduced into the right radial artery. 3 mg of verapamil was administered through the sheath, weight-based unfractionated heparin was administered intravenously. A Jackie catheter was used for selective coronary angiography. A pigtail catheter was used for left ventriculography. Catheter exchanges were performed over an exchange length guidewire. There were no immediate procedural complications. A TR band was used for radial hemostasis at the completion of the procedure.  The patient was transferred to the post catheterization recovery area for further monitoring.  Procedural Findings:  Hemodynamics: AO:  137/82   mmHg LV:  135/10    mmHg LVEDP: 14  mmHg  Coronary angiography: Coronary dominance: Right   Left Main:  Normal  Left Anterior Descending (LAD):  Normal in size with 20% proximal stenosis and 20% mid stenosis.  1st diagonal (D1):  Normal in size with no significant disease.  2nd diagonal (D2):  Small in size with no significant disease.  3rd diagonal (D3):  Medium in size with no significant disease.  Circumflex (LCx):  Normal in size and nondominant. The vessel has minor irregularities.  1st obtuse marginal:  Normal  2nd obtuse marginal:  Large in size with no significant disease.  3rd obtuse marginal:  Normal   Right Coronary Artery: Large in size and dominant. There is 20% proximal stenosis.  Posterior descending artery: Normal in  size with no significant disease.  Posterior AV segment: No significant disease  Posterolateral branchs:  No significant disease.  Left ventriculography: Left ventricular systolic function is severely reduced , LVEF is estimated at 25 %, there is moderate mitral regurgitation . Severe mitral annular calcifications are noted.  Final Conclusions:   1. Mild nonobstructive coronary artery disease. 2. Severely reduced LV systolic function with an ejection fraction of 25% and moderate mitral regurgitation. 3. Mildly elevated left ventricular end-diastolic pressure.  Recommendations:  The patient has nonischemic cardiomyopathy which could be tachycardia induced. I switched diltiazem to metoprolol. Once heart rate is controlled, consider adding back Diovan. I switched furosemide to by mouth given that the LVEDP is only mildly elevated. Start anticoagulation with a NOAC tomorrow.  Kathlyn Sacramento MD, [facility] 10/01/2014, 3:00 PM

## 2014-10-01 NOTE — H&P (View-Only) (Signed)
Patient Name: Bruce Cohen Date of Encounter: 10/01/2014  Active Problems:   Acute respiratory failure with hypoxia   Pulmonary edema, acute   HTN (hypertension)   Patient nonadherence   COPD (chronic obstructive pulmonary disease)   Tobacco abuse   CKD (chronic kidney disease), stage II   Acute CHF   Atrial fibrillation with RVR   Community acquired pneumonia   Primary Cardiologist: Dr. Irish Lack  Patient Profile: 74 y.o. Male with HTN, LVH, HLD, recurrent pericarditis, and 60-years of tobacco use who presented to [facility] ED on 09/29/14 with a 4 day history of dyspnea and newly recognized AF RVR    SUBJECTIVE: No chest pain, no SOB  OBJECTIVE Filed Vitals:   09/30/14 2009 10/01/14 0504 10/01/14 0600 10/01/14 0750  BP: 127/77 136/88    Pulse: 85 90 54   Temp: 97.4 F (36.3 C) 97.9 F (36.6 C)    TempSrc: Oral Oral    Resp:      Height:      Weight:  202 lb 9.6 oz (91.899 kg)    SpO2: 97% 100%  100%    Intake/Output Summary (Last 24 hours) at 10/01/14 7673 Last data filed at 09/30/14 1551  Gross per 24 hour  Intake    480 ml  Output    525 ml  Net    -45 ml   Filed Weights   09/29/14 1656 09/30/14 0600 10/01/14 0504  Weight: 208 lb 6.4 oz (94.53 kg) 202 lb 9.6 oz (91.899 kg) 202 lb 9.6 oz (91.899 kg)    PHYSICAL EXAM General: Well developed, well nourished, male in no acute distress. Head: Normocephalic, atraumatic.  Neck: Supple without bruits, JVD 8-9 cm. Lungs:  Resp regular and unlabored, rales R>L bases. Heart: irreg irreg, S1, S2, no S3, S4, or murmur; no rub. Abdomen: Soft, non-tender, non-distended, BS + x 4.  Extremities: No clubbing, cyanosis, no edema.  Neuro: Alert and oriented X 3. Moves all extremities spontaneously. Psych: Normal affect.  LABS: CBC: Recent Labs  09/29/14 1125 09/30/14 0423  WBC 12.8* 9.1  HGB 14.0 13.5  HCT 41.7 40.4  MCV 93.5 92.7  PLT 337 346   INR: Recent Labs  09/30/14 0423  INR 4.19   Basic  Metabolic Panel: Recent Labs  09/29/14 1125 09/29/14 2235 09/30/14 0423  NA 139  --  138  K 3.9  --  3.4*  CL 107  --  102  CO2 26  --  28  GLUCOSE 106*  --  102*  BUN 16  --  16  CREATININE 1.10  --  1.22  CALCIUM 8.4  --  8.7  MG  --  1.9  --    Liver Function Tests: Recent Labs  09/29/14 1125 09/30/14 0423  AST 13 13  ALT 11 9  ALKPHOS 84 77  BILITOT 1.2 1.2  PROT 7.1 6.9  ALBUMIN 3.4* 3.2*   Cardiac Enzymes: Recent Labs  09/29/14 1827 09/29/14 2235 09/30/14 0423  TROPONINI 0.05* 0.06* 0.06*    Recent Labs  09/29/14 1206  TROPIPOC 0.03   BNP:  B NATRIURETIC PEPTIDE  Date/Time Value Ref Range Status  09/29/2014 11:25 AM 745.9* 0.0 - 100.0 pg/mL Final   Fasting Lipid Panel: Recent Labs  09/30/14 0423  CHOL 116  HDL 30*  LDLCALC 69  TRIG 87  CHOLHDL 3.9   Thyroid Function Tests: Recent Labs  09/29/14 1250  TSH 2.081   TELE:  Atrial fib, rate generally well-controlled,  15 bt VT 03/21, 9 bt VT this am      Radiology/Studies: Dg Chest 2 View 09/29/2014   CLINICAL DATA:  Shortness of breath for 5 days.  EXAM: CHEST  2 VIEW  COMPARISON:  None.  FINDINGS: There is a degree of underlying emphysema. There is widespread interstitial opacity throughout the lungs diffusely. There is some patchy airspace consolidation in the upper lobes, more on the right than on the left. The heart is borderline enlarged with pulmonary vascularity within normal limits. No effusions. No adenopathy. No bone lesions.  IMPRESSION: Question a degree of congestive heart failure superimposed on emphysema. All of the changes which are present may represent interstitial fibrosis as opposed to edema. Without prior studies, this differentiation is difficult. Patchy opacity in the upper lobes potentially could also represent a degree of superimposed pneumonia. Given these changes, a followup study in 5-7 days is advised to assess for stability.   Electronically Signed   By: Lowella Grip III M.D.   On: 09/29/2014 11:44   Ct Chest High Resolution 09/29/2014   CLINICAL DATA:  74 year old male with history of pulmonary fibrosis. Shortness of breath.  EXAM: CT CHEST WITHOUT CONTRAST  TECHNIQUE: Multidetector CT imaging of the chest was performed following the standard protocol without intravenous contrast. High resolution imaging of the lungs, as well as inspiratory and expiratory imaging, was performed.  COMPARISON:  No priors.  FINDINGS: Mediastinum/Lymph Nodes: Heart size is mildly enlarged. There is no significant pericardial fluid, thickening or pericardial calcification. Extensive calcifications of the mitral annulus inferiorly. There is atherosclerosis of the thoracic aorta, the great vessels of the mediastinum and the coronary arteries, including calcified atherosclerotic plaque in the left anterior descending and right coronary arteries. Multiple borderline enlarged and mildly enlarged mediastinal and hilar lymph nodes bilaterally, measuring up to 17 mm in short axis in the low right paratracheal station. Hilar lymphadenopathy is poorly evaluated on today's non contrast CT examination. Esophagus is unremarkable in appearance. No axillary lymphadenopathy.  Lungs/Pleura: High-resolution images demonstrate patchy areas of ground-glass attenuation, septal thickening and some scattered areas of subpleural reticulation. Study is limited by considerable patient respiratory motion. There is mild diffuse bronchial wall thickening. No definite bronchiectasis. No honeycombing. No clear cut craniocaudal gradient to these findings, with greater extent of ground-glass attenuation in the apices. No confluent consolidative airspace disease. Inspiratory and expiratory imaging is unremarkable. Small bilateral pleural effusions (right greater than left) layering dependently.  Upper Abdomen: 1.9 cm low-attenuation lesion in the lateral aspect of segment 3 of the liver is incompletely characterized, but  statistically likely a small cyst.  Musculoskeletal/Soft Tissues: There are no aggressive appearing lytic or blastic lesions noted in the visualized portions of the skeleton.  IMPRESSION: 1. The appearance of the lungs is not strongly favored to reflect interstitial lung disease. Rather, these findings are favored to reflect interstitial pulmonary edema in the setting of congestive heart failure. Clinical correlation is suggested. If there is persistent clinical concern for interstitial lung disease, repeat high-resolution chest CT could be performed when the patient is not acutely ill and better able to hold his breath in the next 2-3 months. Other differential considerations would include atypical multilobar pneumonia, however, that is not strongly favored. Clinical correlation is suggested. 2. Small bilateral pleural effusions (right greater than left) layering dependently. 3. Mediastinal and bilateral hilar lymphadenopathy is nonspecific. This could be seen in the setting of congestive failure, infection, and/or interstitial lung disease. Attention at time of repeat high-resolution chest  CT is suggested if of clinical concern. 4. Atherosclerosis, including two vessel coronary artery disease. Assessment for potential risk factor modification, dietary therapy or pharmacologic therapy may be warranted, if clinically indicated.   Electronically Signed   By: Vinnie Langton M.D.   On: 09/29/2014 16:47     Current Medications:  . amLODipine  5 mg Oral Daily  . antiseptic oral rinse  7 mL Mouth Rinse BID  . aspirin EC  81 mg Oral Daily  . docusate sodium  100 mg Oral BID  . folic acid  1 mg Oral Daily  . furosemide  40 mg Intravenous BID  . LORazepam  0-4 mg Intravenous Q6H   Followed by  . LORazepam  0-4 mg Intravenous Q12H  . multivitamin with minerals  1 tablet Oral Daily  . rosuvastatin  20 mg Oral q1800  . sodium chloride  3 mL Intravenous Q12H  . thiamine  100 mg Oral Daily   Or  . thiamine   100 mg Intravenous Daily   . sodium chloride 50 mL/hr at 10/01/14 0414  . diltiazem (CARDIZEM) infusion 10 mg/hr (09/30/14 2142)  . heparin 2,250 Units/hr (10/01/14 0612)    ASSESSMENT AND PLAN: 74 y.o. Male with HTN, LVH, HLD, recurrent pericarditis, and 60-years of tobacco use who presented to [facility] ED on 09/29/14 with a 4 day history of dyspnea and newly recognized AF RVR   New onset atrial fibrillation with RVR -- Currently in A. Fib. HRs currently HRs generally controlled. Change dilt gtt at 10 mg/hr to 60 mg q 6 h.  -- CHADSVASC at least 3 ( HTN 1, AGE 89, +/- CHF 1). Will likely need long term AC, but currently on hold as we are planning to do a LHC tomorrow. Continue heparin gtt.   Hypertension -- BP slightly elevated still.  -- Currently on home norvasc 5mg  plus Dilt, will d/c Norvasc -- was on Diovan/HCTZ 160/ 12.5 PTA, MD advise on restarting valsartan 160 mg.  Acute CHF- pulm edema on CXR and CT chest. BNP mildly elevated at 745 -- SOB improved. Continue IV lasix 40mg  BID. Net neg >2.L. Creat stable for now. Continue to monitor  -- Almost Euvolemic on exam  -- 2D Echo pending.   NSTEMI -- troponin mildly elevated at 0.06. 2 V dz per CT report -- Continue heparin gtt  -- Will plan for LHC today. NPO w/ R/L cath scheduled for 11:30, ?sm amt clear liquid.   Tobacco abuse -- States he quit smoking prior to this admission -- Encouraged cessation  Hypokalemia - mild. Repleted per IM w/ 30 meq - recheck pending  Recurrent pericarditis- per patient report. CT with no signs of constrictive pericarditis.   NSVT - in the setting of acute illness - K+ slightly low 03/22, recheck pending - asymptomatic - May need to transition Dilt to Coreg, especially if EF low.  Signed, Rosaria Ferries , PA-C 8:12 AM 10/01/2014  Patient seen, examined. Available data reviewed. Agree with findings, assessment, and plan as outlined by Rosaria Ferries, PA-C. The patient is  independently interviewed and examined. His lung fields are clear. Heart is irregularly irregular with no murmur or gallop. There is no peripheral edema. I have again reviewed the risks, indications, and alternatives to cardiac catheterization with the patient. He understands and agrees to proceed. I don't think he needs a right heart catheterization and will change his procedure to a left heart cath with coronary angiography as this is the primary issue. He has  no clinical evidence of constrictive pericarditis despite his history of pericarditis in the past. A 2-D echocardiogram has been ordered. After cardiac catheterization, anticipate changing him to an oral anticoagulant drug. All questions answered this morning. Agree with changing him to oral diltiazem. Will likely resume valsartan tomorrow.   Sherren Mocha, M.D. 10/01/2014 9:20 AM

## 2014-10-01 NOTE — Progress Notes (Signed)
[facility] PROGRESS NOTE  BLAYKE CORDREY ATF:[id number] DOB: 1940/12/22 DOA: 09/29/2014 PCP: Henrine Screws, MD  Assessment/Plan: 1. New onset A. Fib- heart rate is controlled, continue metoprolol 25 mg by mouth 4 times a day. Anti-coagulation with Xarelto to be started on 10/02/2014 2. Pulmonary edema- patient found to have undergone chest x-ray. BNP elevated to 745 started on IV Lasix. Cardiology is following. 3. Mild elevation of troponin- troponin was mildly limited to 0.06, plan for left heart cath as per cardiology 4. Hypertension- blood pressure is controlled, continue metoprolol. 5. COPD- stable  Code Status: Full code Family Communication: No family at bedside Disposition Plan: Home when stable   Consultants:  Cardiology  Procedures:  None  Antibiotics:  None  HPI/Subjective: 74 year old male with a history of hypertension, LVH, hyperlipidemia, recurrent pericarditis who came to the ED with shortness of breath and dyspnea on exertion. Patient was found to be in atrial fibrillation with RVR. Patient started on Cardizem infusion. Cardiology consulted. Patient also had acute respiratory failure with hypoxia/pulmonary edema started on IV Lasix. This morning patient feels better. Today, with plans to do left heart catheterization.  Objective: Filed Vitals:   10/01/14 1559  BP: 133/84  Pulse: 87  Temp:   Resp:     Intake/Output Summary (Last 24 hours) at 10/01/14 1624 Last data filed at 10/01/14 1055  Gross per 24 hour  Intake      0 ml  Output   1000 ml  Net  -1000 ml   Filed Weights   09/29/14 1656 09/30/14 0600 10/01/14 0504  Weight: 94.53 kg (208 lb 6.4 oz) 91.899 kg (202 lb 9.6 oz) 91.899 kg (202 lb 9.6 oz)    Exam:   General:  Appears in no acute distress  Cardiovascular: *S1-S2 irregularly irregular  Respiratory: Clear to auscultation bilaterally  Abdomen: Soft, nontender no organomegaly  Musculoskeletal: No edema of the lower  extremities   Data Reviewed: Basic Metabolic Panel:  Recent Labs Lab 09/29/14 1125 09/29/14 2235 09/30/14 0423 10/01/14 0800  NA 139  --  138 136  K 3.9  --  3.4* 4.2  CL 107  --  102 103  CO2 26  --  28 26  GLUCOSE 106*  --  102* 95  BUN 16  --  16 15  CREATININE 1.10  --  1.22 1.16  CALCIUM 8.4  --  8.7 8.7  MG  --  1.9  --   --    Liver Function Tests:  Recent Labs Lab 09/29/14 1125 09/30/14 0423  AST 13 13  ALT 11 9  ALKPHOS 84 77  BILITOT 1.2 1.2  PROT 7.1 6.9  ALBUMIN 3.4* 3.2*   No results for input(s): LIPASE, AMYLASE in the last 168 hours. No results for input(s): AMMONIA in the last 168 hours. CBC:  Recent Labs Lab 09/29/14 1125 09/30/14 0423 10/01/14 0800  WBC 12.8* 9.1 8.5  HGB 14.0 13.5 13.6  HCT 41.7 40.4 40.5  MCV 93.5 92.7 92.5  PLT 337 346 341   Cardiac Enzymes:  Recent Labs Lab 09/29/14 1827 09/29/14 2235 09/30/14 0423  TROPONINI 0.05* 0.06* 0.06*   BNP (last 3 results)  Recent Labs  09/29/14 1125  BNP 745.9*    ProBNP (last 3 results) No results for input(s): PROBNP in the last 8760 hours.  CBG: No results for input(s): GLUCAP in the last 168 hours.  Recent Results (from the past 240 hour(s))  Blood culture (routine x 2)  Status: None (Preliminary result)   Collection Time: 09/29/14 12:50 PM  Result Value Ref Range Status   Specimen Description BLOOD RIGHT ANTECUBITAL  Final   Special Requests BOTTLES DRAWN AEROBIC AND ANAEROBIC 10MLS  Final   Culture   Final           BLOOD CULTURE RECEIVED NO GROWTH TO DATE CULTURE WILL BE HELD FOR 5 DAYS BEFORE ISSUING A FINAL NEGATIVE REPORT Performed at [facility]    Report Status PENDING  Incomplete  Blood culture (routine x 2)     Status: None (Preliminary result)   Collection Time: 09/29/14  1:00 PM  Result Value Ref Range Status   Specimen Description BLOOD RIGHT HAND  Final   Special Requests BOTTLES DRAWN AEROBIC AND ANAEROBIC 5CC  Final   Culture    Final           BLOOD CULTURE RECEIVED NO GROWTH TO DATE CULTURE WILL BE HELD FOR 5 DAYS BEFORE ISSUING A FINAL NEGATIVE REPORT Performed at [facility]    Report Status PENDING  Incomplete  MRSA PCR Screening     Status: None   Collection Time: 09/29/14  5:17 PM  Result Value Ref Range Status   MRSA by PCR NEGATIVE NEGATIVE Final    Comment:        The GeneXpert MRSA Assay (FDA approved for NASAL specimens only), is one component of a comprehensive MRSA colonization surveillance program. It is not intended to diagnose MRSA infection nor to guide or monitor treatment for MRSA infections.      Studies: Ct Chest High Resolution  09/29/2014   CLINICAL DATA:  74 year old male with history of pulmonary fibrosis. Shortness of breath.  EXAM: CT CHEST WITHOUT CONTRAST  TECHNIQUE: Multidetector CT imaging of the chest was performed following the standard protocol without intravenous contrast. High resolution imaging of the lungs, as well as inspiratory and expiratory imaging, was performed.  COMPARISON:  No priors.  FINDINGS: Mediastinum/Lymph Nodes: Heart size is mildly enlarged. There is no significant pericardial fluid, thickening or pericardial calcification. Extensive calcifications of the mitral annulus inferiorly. There is atherosclerosis of the thoracic aorta, the great vessels of the mediastinum and the coronary arteries, including calcified atherosclerotic plaque in the left anterior descending and right coronary arteries. Multiple borderline enlarged and mildly enlarged mediastinal and hilar lymph nodes bilaterally, measuring up to 17 mm in short axis in the low right paratracheal station. Hilar lymphadenopathy is poorly evaluated on today's non contrast CT examination. Esophagus is unremarkable in appearance. No axillary lymphadenopathy.  Lungs/Pleura: High-resolution images demonstrate patchy areas of ground-glass attenuation, septal thickening and some scattered areas of  subpleural reticulation. Study is limited by considerable patient respiratory motion. There is mild diffuse bronchial wall thickening. No definite bronchiectasis. No honeycombing. No clear cut craniocaudal gradient to these findings, with greater extent of ground-glass attenuation in the apices. No confluent consolidative airspace disease. Inspiratory and expiratory imaging is unremarkable. Small bilateral pleural effusions (right greater than left) layering dependently.  Upper Abdomen: 1.9 cm low-attenuation lesion in the lateral aspect of segment 3 of the liver is incompletely characterized, but statistically likely a small cyst.  Musculoskeletal/Soft Tissues: There are no aggressive appearing lytic or blastic lesions noted in the visualized portions of the skeleton.  IMPRESSION: 1. The appearance of the lungs is not strongly favored to reflect interstitial lung disease. Rather, these findings are favored to reflect interstitial pulmonary edema in the setting of congestive heart failure. Clinical correlation is suggested. If  there is persistent clinical concern for interstitial lung disease, repeat high-resolution chest CT could be performed when the patient is not acutely ill and better able to hold his breath in the next 2-3 months. Other differential considerations would include atypical multilobar pneumonia, however, that is not strongly favored. Clinical correlation is suggested. 2. Small bilateral pleural effusions (right greater than left) layering dependently. 3. Mediastinal and bilateral hilar lymphadenopathy is nonspecific. This could be seen in the setting of congestive failure, infection, and/or interstitial lung disease. Attention at time of repeat high-resolution chest CT is suggested if of clinical concern. 4. Atherosclerosis, including two vessel coronary artery disease. Assessment for potential risk factor modification, dietary therapy or pharmacologic therapy may be warranted, if clinically  indicated.   Electronically Signed   By: Vinnie Langton M.D.   On: 09/29/2014 16:47    Scheduled Meds: . antiseptic oral rinse  7 mL Mouth Rinse BID  . [START ON 10/02/2014] apixaban  5 mg Oral BID  . aspirin EC  81 mg Oral Daily  . docusate sodium  100 mg Oral BID  . folic acid  1 mg Oral Daily  . [START ON 10/02/2014] furosemide  40 mg Oral Daily  . LORazepam  0-4 mg Intravenous Q6H   Followed by  . LORazepam  0-4 mg Intravenous Q12H  . metoprolol tartrate  25 mg Oral 4 times per day  . multivitamin with minerals  1 tablet Oral Daily  . rosuvastatin  20 mg Oral q1800  . sodium chloride  3 mL Intravenous Q12H  . thiamine  100 mg Oral Daily   Or  . thiamine  100 mg Intravenous Daily   Continuous Infusions: . sodium chloride 10 mL/hr at 10/01/14 0850  . sodium chloride 50 mL/hr at 10/01/14 1531    Active Problems:   Acute respiratory failure with hypoxia   Pulmonary edema, acute   HTN (hypertension)   Patient nonadherence   COPD (chronic obstructive pulmonary disease)   Tobacco abuse   CKD (chronic kidney disease), stage II   Acute CHF   Atrial fibrillation with RVR   Community acquired pneumonia    Time spent: [facility] Pager [phone]. If 7PM-7AM, please contact night-coverage at [email or web address], password [facility] 10/01/2014, 4:24 PM  LOS: 2 days

## 2014-10-01 NOTE — Progress Notes (Signed)
Pharmacy note: Apixiban  74 yo male with afib (CHADSVASC= 3) now s/p cath and to begin [place] on 3/24.   -Wt= 91.9kg, SCr=  1.16 (Patient satisfies criteria for apixiban 5mg  po bid)   Plan -apixiban 5mg  po bid beginning 10/02/14 -Will provide patient education on 10/02/14  Hildred Laser, Sherian Rein D 10/01/2014 3:37 PM

## 2014-10-01 NOTE — Progress Notes (Signed)
ANTICOAGULATION CONSULT NOTE   Pharmacy Consult for Heparin  Indication: atrial fibrillation  No Known Allergies  Patient Measurements: Height: 6\' 2"  (188 cm) Weight: 202 lb 9.6 oz (91.899 kg) IBW/kg (Calculated) : 82.2 Vital Signs: Temp: 97.4 F (36.3 C) (03/22 2009) Temp Source: Oral (03/22 2009) BP: 127/77 mmHg (03/22 2009) Pulse Rate: 85 (03/22 2009)  Labs:  Recent Labs  09/29/14 1125 09/29/14 1827  09/29/14 2235 09/30/14 0423 09/30/14 1441 09/30/14 2336  HGB 14.0  --   --   --  13.5  --   --   HCT 41.7  --   --   --  40.4  --   --   PLT 337  --   --   --  346  --   --   APTT  --   --   --   --  69*  --   --   LABPROT 15.9*  --   --   --  15.8*  --   --   INR 1.25  --   --   --  1.25  --   --   HEPARINUNFRC  --   --   < >  --  <0.10* 0.12* 0.23*  CREATININE 1.10  --   --   --  1.22  --   --   TROPONINI  --  0.05*  --  0.06* 0.06*  --   --   < > = values in this interval not displayed.  Estimated Creatinine Clearance: 61.8 mL/min (by C-G formula based on Cr of 1.22).  Assessment: 74 yo male with afib for heparin Goal of Therapy:  Heparin level 0.3-0.7 units/ml Monitor platelets by anticoagulation protocol: Yes   Plan:  Heparin 2500 units IV bolus, then increase heparin 2250 units/hr F/U after cath tomorrow  Phillis Knack, PharmD, BCPS 10/01/2014 12:31 AM

## 2014-10-01 NOTE — Progress Notes (Signed)
Patient Name: Bruce Cohen Date of Encounter: 10/01/2014  Active Problems:   Acute respiratory failure with hypoxia   Pulmonary edema, acute   HTN (hypertension)   Patient nonadherence   COPD (chronic obstructive pulmonary disease)   Tobacco abuse   CKD (chronic kidney disease), stage II   Acute CHF   Atrial fibrillation with RVR   Community acquired pneumonia   Primary Cardiologist: Dr. Irish Lack  Patient Profile: 74 y.o. Male with HTN, LVH, HLD, recurrent pericarditis, and 60-years of tobacco use who presented to [facility] ED on 09/29/14 with a 4 day history of dyspnea and newly recognized AF RVR    SUBJECTIVE: No chest pain, no SOB  OBJECTIVE Filed Vitals:   09/30/14 2009 10/01/14 0504 10/01/14 0600 10/01/14 0750  BP: 127/77 136/88    Pulse: 85 90 54   Temp: 97.4 F (36.3 C) 97.9 F (36.6 C)    TempSrc: Oral Oral    Resp:      Height:      Weight:  202 lb 9.6 oz (91.899 kg)    SpO2: 97% 100%  100%    Intake/Output Summary (Last 24 hours) at 10/01/14 2951 Last data filed at 09/30/14 1551  Gross per 24 hour  Intake    480 ml  Output    525 ml  Net    -45 ml   Filed Weights   09/29/14 1656 09/30/14 0600 10/01/14 0504  Weight: 208 lb 6.4 oz (94.53 kg) 202 lb 9.6 oz (91.899 kg) 202 lb 9.6 oz (91.899 kg)    PHYSICAL EXAM General: Well developed, well nourished, male in no acute distress. Head: Normocephalic, atraumatic.  Neck: Supple without bruits, JVD 8-9 cm. Lungs:  Resp regular and unlabored, rales R>L bases. Heart: irreg irreg, S1, S2, no S3, S4, or murmur; no rub. Abdomen: Soft, non-tender, non-distended, BS + x 4.  Extremities: No clubbing, cyanosis, no edema.  Neuro: Alert and oriented X 3. Moves all extremities spontaneously. Psych: Normal affect.  LABS: CBC: Recent Labs  09/29/14 1125 09/30/14 0423  WBC 12.8* 9.1  HGB 14.0 13.5  HCT 41.7 40.4  MCV 93.5 92.7  PLT 337 346   INR: Recent Labs  09/30/14 0423  INR 8.84   Basic  Metabolic Panel: Recent Labs  09/29/14 1125 09/29/14 2235 09/30/14 0423  NA 139  --  138  K 3.9  --  3.4*  CL 107  --  102  CO2 26  --  28  GLUCOSE 106*  --  102*  BUN 16  --  16  CREATININE 1.10  --  1.22  CALCIUM 8.4  --  8.7  MG  --  1.9  --    Liver Function Tests: Recent Labs  09/29/14 1125 09/30/14 0423  AST 13 13  ALT 11 9  ALKPHOS 84 77  BILITOT 1.2 1.2  PROT 7.1 6.9  ALBUMIN 3.4* 3.2*   Cardiac Enzymes: Recent Labs  09/29/14 1827 09/29/14 2235 09/30/14 0423  TROPONINI 0.05* 0.06* 0.06*    Recent Labs  09/29/14 1206  TROPIPOC 0.03   BNP:  B NATRIURETIC PEPTIDE  Date/Time Value Ref Range Status  09/29/2014 11:25 AM 745.9* 0.0 - 100.0 pg/mL Final   Fasting Lipid Panel: Recent Labs  09/30/14 0423  CHOL 116  HDL 30*  LDLCALC 69  TRIG 87  CHOLHDL 3.9   Thyroid Function Tests: Recent Labs  09/29/14 1250  TSH 2.081   TELE:  Atrial fib, rate generally well-controlled,  15 bt VT 03/21, 9 bt VT this am      Radiology/Studies: Dg Chest 2 View 09/29/2014   CLINICAL DATA:  Shortness of breath for 5 days.  EXAM: CHEST  2 VIEW  COMPARISON:  None.  FINDINGS: There is a degree of underlying emphysema. There is widespread interstitial opacity throughout the lungs diffusely. There is some patchy airspace consolidation in the upper lobes, more on the right than on the left. The heart is borderline enlarged with pulmonary vascularity within normal limits. No effusions. No adenopathy. No bone lesions.  IMPRESSION: Question a degree of congestive heart failure superimposed on emphysema. All of the changes which are present may represent interstitial fibrosis as opposed to edema. Without prior studies, this differentiation is difficult. Patchy opacity in the upper lobes potentially could also represent a degree of superimposed pneumonia. Given these changes, a followup study in 5-7 days is advised to assess for stability.   Electronically Signed   By: Lowella Grip III M.D.   On: 09/29/2014 11:44   Ct Chest High Resolution 09/29/2014   CLINICAL DATA:  74 year old male with history of pulmonary fibrosis. Shortness of breath.  EXAM: CT CHEST WITHOUT CONTRAST  TECHNIQUE: Multidetector CT imaging of the chest was performed following the standard protocol without intravenous contrast. High resolution imaging of the lungs, as well as inspiratory and expiratory imaging, was performed.  COMPARISON:  No priors.  FINDINGS: Mediastinum/Lymph Nodes: Heart size is mildly enlarged. There is no significant pericardial fluid, thickening or pericardial calcification. Extensive calcifications of the mitral annulus inferiorly. There is atherosclerosis of the thoracic aorta, the great vessels of the mediastinum and the coronary arteries, including calcified atherosclerotic plaque in the left anterior descending and right coronary arteries. Multiple borderline enlarged and mildly enlarged mediastinal and hilar lymph nodes bilaterally, measuring up to 17 mm in short axis in the low right paratracheal station. Hilar lymphadenopathy is poorly evaluated on today's non contrast CT examination. Esophagus is unremarkable in appearance. No axillary lymphadenopathy.  Lungs/Pleura: High-resolution images demonstrate patchy areas of ground-glass attenuation, septal thickening and some scattered areas of subpleural reticulation. Study is limited by considerable patient respiratory motion. There is mild diffuse bronchial wall thickening. No definite bronchiectasis. No honeycombing. No clear cut craniocaudal gradient to these findings, with greater extent of ground-glass attenuation in the apices. No confluent consolidative airspace disease. Inspiratory and expiratory imaging is unremarkable. Small bilateral pleural effusions (right greater than left) layering dependently.  Upper Abdomen: 1.9 cm low-attenuation lesion in the lateral aspect of segment 3 of the liver is incompletely characterized, but  statistically likely a small cyst.  Musculoskeletal/Soft Tissues: There are no aggressive appearing lytic or blastic lesions noted in the visualized portions of the skeleton.  IMPRESSION: 1. The appearance of the lungs is not strongly favored to reflect interstitial lung disease. Rather, these findings are favored to reflect interstitial pulmonary edema in the setting of congestive heart failure. Clinical correlation is suggested. If there is persistent clinical concern for interstitial lung disease, repeat high-resolution chest CT could be performed when the patient is not acutely ill and better able to hold his breath in the next 2-3 months. Other differential considerations would include atypical multilobar pneumonia, however, that is not strongly favored. Clinical correlation is suggested. 2. Small bilateral pleural effusions (right greater than left) layering dependently. 3. Mediastinal and bilateral hilar lymphadenopathy is nonspecific. This could be seen in the setting of congestive failure, infection, and/or interstitial lung disease. Attention at time of repeat high-resolution chest  CT is suggested if of clinical concern. 4. Atherosclerosis, including two vessel coronary artery disease. Assessment for potential risk factor modification, dietary therapy or pharmacologic therapy may be warranted, if clinically indicated.   Electronically Signed   By: Vinnie Langton M.D.   On: 09/29/2014 16:47     Current Medications:  . amLODipine  5 mg Oral Daily  . antiseptic oral rinse  7 mL Mouth Rinse BID  . aspirin EC  81 mg Oral Daily  . docusate sodium  100 mg Oral BID  . folic acid  1 mg Oral Daily  . furosemide  40 mg Intravenous BID  . LORazepam  0-4 mg Intravenous Q6H   Followed by  . LORazepam  0-4 mg Intravenous Q12H  . multivitamin with minerals  1 tablet Oral Daily  . rosuvastatin  20 mg Oral q1800  . sodium chloride  3 mL Intravenous Q12H  . thiamine  100 mg Oral Daily   Or  . thiamine   100 mg Intravenous Daily   . sodium chloride 50 mL/hr at 10/01/14 0414  . diltiazem (CARDIZEM) infusion 10 mg/hr (09/30/14 2142)  . heparin 2,250 Units/hr (10/01/14 0612)    ASSESSMENT AND PLAN: 74 y.o. Male with HTN, LVH, HLD, recurrent pericarditis, and 60-years of tobacco use who presented to [facility] ED on 09/29/14 with a 4 day history of dyspnea and newly recognized AF RVR   New onset atrial fibrillation with RVR -- Currently in A. Fib. HRs currently HRs generally controlled. Change dilt gtt at 10 mg/hr to 60 mg q 6 h.  -- CHADSVASC at least 3 ( HTN 1, AGE 82, +/- CHF 1). Will likely need long term AC, but currently on hold as we are planning to do a LHC tomorrow. Continue heparin gtt.   Hypertension -- BP slightly elevated still.  -- Currently on home norvasc 5mg  plus Dilt, will d/c Norvasc -- was on Diovan/HCTZ 160/ 12.5 PTA, MD advise on restarting valsartan 160 mg.  Acute CHF- pulm edema on CXR and CT chest. BNP mildly elevated at 745 -- SOB improved. Continue IV lasix 40mg  BID. Net neg >2.L. Creat stable for now. Continue to monitor  -- Almost Euvolemic on exam  -- 2D Echo pending.   NSTEMI -- troponin mildly elevated at 0.06. 2 V dz per CT report -- Continue heparin gtt  -- Will plan for LHC today. NPO w/ R/L cath scheduled for 11:30, ?sm amt clear liquid.   Tobacco abuse -- States he quit smoking prior to this admission -- Encouraged cessation  Hypokalemia - mild. Repleted per IM w/ 30 meq - recheck pending  Recurrent pericarditis- per patient report. CT with no signs of constrictive pericarditis.   NSVT - in the setting of acute illness - K+ slightly low 03/22, recheck pending - asymptomatic - May need to transition Dilt to Coreg, especially if EF low.  Signed, Rosaria Ferries , PA-C 8:12 AM 10/01/2014  Patient seen, examined. Available data reviewed. Agree with findings, assessment, and plan as outlined by Rosaria Ferries, PA-C. The patient is  independently interviewed and examined. His lung fields are clear. Heart is irregularly irregular with no murmur or gallop. There is no peripheral edema. I have again reviewed the risks, indications, and alternatives to cardiac catheterization with the patient. He understands and agrees to proceed. I don't think he needs a right heart catheterization and will change his procedure to a left heart cath with coronary angiography as this is the primary issue. He has  no clinical evidence of constrictive pericarditis despite his history of pericarditis in the past. A 2-D echocardiogram has been ordered. After cardiac catheterization, anticipate changing him to an oral anticoagulant drug. All questions answered this morning. Agree with changing him to oral diltiazem. Will likely resume valsartan tomorrow.   Sherren Mocha, M.D. 10/01/2014 9:20 AM

## 2014-10-01 NOTE — Progress Notes (Signed)
ANTICOAGULATION CONSULT NOTE - Follow Up Consult  Pharmacy Consult for Heparin  Indication: atrial fibrillation  No Known Allergies  Patient Measurements: Height: 6\' 2"  (188 cm) Weight: 202 lb 9.6 oz (91.899 kg) IBW/kg (Calculated) : 82.2 Vital Signs: Temp: 97.9 F (36.6 C) (03/23 0504) Temp Source: Oral (03/23 0504) BP: 136/88 mmHg (03/23 0504) Pulse Rate: 54 (03/23 0600)  Labs:  Recent Labs  09/29/14 1125 09/29/14 1827  09/29/14 2235 09/30/14 0423 09/30/14 1441 09/30/14 2336 10/01/14 0800 10/01/14 0808  HGB 14.0  --   --   --  13.5  --   --  13.6  --   HCT 41.7  --   --   --  40.4  --   --  40.5  --   PLT 337  --   --   --  346  --   --  341  --   APTT  --   --   --   --  69*  --   --   --   --   LABPROT 15.9*  --   --   --  15.8*  --   --   --   --   INR 1.25  --   --   --  1.25  --   --   --   --   HEPARINUNFRC  --   --   < >  --  <0.10* 0.12* 0.23*  --  0.44  CREATININE 1.10  --   --   --  1.22  --   --  1.16  --   TROPONINI  --  0.05*  --  0.06* 0.06*  --   --   --   --   < > = values in this interval not displayed.  Estimated Creatinine Clearance: 65 mL/min (by C-G formula based on Cr of 1.16).  Assessment: 74 yo male with afib on heparin and heparin level is at goal (HL= 0.44) after heparin increase to 2250 units/hr.  Plans noted for oral anticoagulation post cath.  Goal of Therapy:  Heparin level 0.3-0.7 units/ml Monitor platelets by anticoagulation protocol: Yes   Plan:  -No heparin changes needed -Will follow plans post cath  Hildred Laser, Pharm D 10/01/2014 11:03 AM

## 2014-10-01 NOTE — Progress Notes (Signed)
  Echocardiogram 2D Echocardiogram has been performed.  Bruce Cohen [name] 10/01/2014, 10:46 AM

## 2014-10-02 LAB — CBC
HCT: 42.3 % (ref 39.0–52.0)
Hemoglobin: 14.1 g/dL (ref 13.0–17.0)
MCH: 31.2 pg (ref 26.0–34.0)
MCHC: 33.3 g/dL (ref 30.0–36.0)
MCV: 93.6 fL (ref 78.0–100.0)
PLATELETS: 343 10*3/uL (ref 150–400)
RBC: 4.52 MIL/uL (ref 4.22–5.81)
RDW: 14 % (ref 11.5–15.5)
WBC: 9.8 10*3/uL (ref 4.0–10.5)

## 2014-10-02 LAB — BASIC METABOLIC PANEL
Anion gap: 9 (ref 5–15)
BUN: 19 mg/dL (ref 6–23)
CHLORIDE: 102 mmol/L (ref 96–112)
CO2: 25 mmol/L (ref 19–32)
Calcium: 8.9 mg/dL (ref 8.4–10.5)
Creatinine, Ser: 1.14 mg/dL (ref 0.50–1.35)
GFR calc Af Amer: 71 mL/min — ABNORMAL LOW (ref >90)
GFR calc non Af Amer: 61 mL/min — ABNORMAL LOW (ref >90)
Glucose, Bld: 102 mg/dL — ABNORMAL HIGH (ref 70–99)
POTASSIUM: 4.1 mmol/L (ref 3.5–5.1)
SODIUM: 136 mmol/L (ref 135–145)

## 2014-10-02 MED ORDER — METOPROLOL TARTRATE 25 MG PO TABS
25.0000 mg | ORAL_TABLET | Freq: Once | ORAL | Status: AC
  Administered 2014-10-02: 25 mg via ORAL
  Filled 2014-10-02: qty 1

## 2014-10-02 MED ORDER — METOPROLOL TARTRATE 50 MG PO TABS
50.0000 mg | ORAL_TABLET | Freq: Two times a day (BID) | ORAL | Status: DC
  Administered 2014-10-02 – 2014-10-03 (×2): 50 mg via ORAL
  Filled 2014-10-02 (×2): qty 1

## 2014-10-02 MED ORDER — LOSARTAN POTASSIUM 25 MG PO TABS
25.0000 mg | ORAL_TABLET | Freq: Every day | ORAL | Status: DC
  Administered 2014-10-02 – 2014-10-03 (×2): 25 mg via ORAL
  Filled 2014-10-02 (×2): qty 1

## 2014-10-02 NOTE — Discharge Instructions (Signed)
Stroke Prevention Some medical conditions and behaviors are associated with an increased chance of having a stroke. You may prevent a stroke by making healthy choices and managing medical conditions. HOW CAN I REDUCE MY RISK OF HAVING A STROKE?   Stay physically active. Get at least 30 minutes of activity on most or all days.  Do not smoke. It may also be helpful to avoid exposure to secondhand smoke.  Limit alcohol use. Moderate alcohol use is considered to be:  No more than 2 drinks per day for men.  No more than 1 drink per day for nonpregnant women.  Eat healthy foods. This involves:  Eating 5 or more servings of fruits and vegetables a day.  Making dietary changes that address high blood pressure (hypertension), high cholesterol, diabetes, or obesity.  Manage your cholesterol levels.  Making food choices that are high in fiber and low in saturated fat, trans fat, and cholesterol may control cholesterol levels.  Take any prescribed medicines to control cholesterol as directed by your health care provider.  Manage your diabetes.  Controlling your carbohydrate and sugar intake is recommended to manage diabetes.  Take any prescribed medicines to control diabetes as directed by your health care provider.  Control your hypertension.  Making food choices that are low in salt (sodium), saturated fat, trans fat, and cholesterol is recommended to manage hypertension.  Take any prescribed medicines to control hypertension as directed by your health care provider.  Maintain a healthy weight.  Reducing calorie intake and making food choices that are low in sodium, saturated fat, trans fat, and cholesterol are recommended to manage weight.  Stop drug abuse.  Avoid taking birth control pills.  Talk to your health care provider about the risks of taking birth control pills if you are over 7 years old, smoke, get migraines, or have ever had a blood clot.  Get evaluated for sleep  disorders (sleep apnea).  Talk to your health care provider about getting a sleep evaluation if you snore a lot or have excessive sleepiness.  Take medicines only as directed by your health care provider.  For some people, aspirin or blood thinners (anticoagulants) are helpful in reducing the risk of forming abnormal blood clots that can lead to stroke. If you have the irregular heart rhythm of atrial fibrillation, you should be on a blood thinner unless there is a good reason you cannot take them.  Understand all your medicine instructions.  Make sure that other conditions (such as anemia or atherosclerosis) are addressed. SEEK IMMEDIATE MEDICAL CARE IF:   You have sudden weakness or numbness of the face, arm, or leg, especially on one side of the body.  Your face or eyelid droops to one side.  You have sudden confusion.  You have trouble speaking (aphasia) or understanding.  You have sudden trouble seeing in one or both eyes.  You have sudden trouble walking.  You have dizziness.  You have a loss of balance or coordination.  You have a sudden, severe headache with no known cause.  You have new chest pain or an irregular heartbeat. Any of these symptoms may represent a serious problem that is an emergency. Do not wait to see if the symptoms will go away. Get medical help at once. Call your local emergency services (911 in [place] Do not drive yourself to the hospital. Document Released: 08/04/2004 Document Revised: 11/11/2013 Document Reviewed: 12/28/2012 [facility] Patient Information 2015 [place], [place]. This information is not intended to replace advice given  to you by your health care provider. Make sure you discuss any questions you have with your health care provider. Atrial Fibrillation Atrial fibrillation is a type of irregular heart rhythm (arrhythmia). During atrial fibrillation, the upper chambers of the heart (atria) quiver continuously in a chaotic pattern. This causes  an irregular and often rapid heart rate.  Atrial fibrillation is the result of the heart becoming overloaded with disorganized signals that tell it to beat. These signals are normally released one at a time by a part of the right atrium called the sinoatrial node. They then travel from the atria to the lower chambers of the heart (ventricles), causing the atria and ventricles to contract and pump blood as they pass. In atrial fibrillation, parts of the atria outside of the sinoatrial node also release these signals. This results in two problems. First, the atria receive so many signals that they do not have time to fully contract. Second, the ventricles, which can only receive one signal at a time, beat irregularly and out of rhythm with the atria.  There are three types of atrial fibrillation:   Paroxysmal. Paroxysmal atrial fibrillation starts suddenly and stops on its own within a week.  Persistent. Persistent atrial fibrillation lasts for more than a week. It may stop on its own or with treatment.  Permanent. Permanent atrial fibrillation does not go away. Episodes of atrial fibrillation may lead to permanent atrial fibrillation. Atrial fibrillation can prevent your heart from pumping blood normally. It increases your risk of stroke and can lead to heart failure.  CAUSES   Heart conditions, including a heart attack, heart failure, coronary artery disease, and heart valve conditions.   Inflammation of the sac that surrounds the heart (pericarditis).  Blockage of an artery in the lungs (pulmonary embolism).  Pneumonia or other infections.  Chronic lung disease.  Thyroid problems, especially if the thyroid is overactive (hyperthyroidism).  Caffeine, excessive alcohol use, and use of some illegal drugs.   Use of some medicines, including certain decongestants and diet pills.  Heart surgery.   Birth defects.  Sometimes, no cause can be found. When this happens, the atrial  fibrillation is called lone atrial fibrillation. The risk of complications from atrial fibrillation increases if you have lone atrial fibrillation and you are age 55 years or older. RISK FACTORS  Heart failure.  Coronary artery disease.  Diabetes mellitus.   High blood pressure (hypertension).   Obesity.   Other arrhythmias.   Increased age. SIGNS AND SYMPTOMS   A feeling that your heart is beating rapidly or irregularly.   A feeling of discomfort or pain in your chest.   Shortness of breath.   Sudden light-headedness or weakness.   Getting tired easily when exercising.   Urinating more often than normal (mainly when atrial fibrillation first begins).  In paroxysmal atrial fibrillation, symptoms may start and suddenly stop. DIAGNOSIS  Your health care provider may be able to detect atrial fibrillation when taking your pulse. Your health care provider may have you take a test called an ambulatory electrocardiogram (ECG). An ECG records your heartbeat patterns over a 24-hour period. You may also have other tests, such as:  Transthoracic echocardiogram (TTE). During echocardiography, sound waves are used to evaluate how blood flows through your heart.  Transesophageal echocardiogram (TEE).  Stress test. There is more than one type of stress test. If a stress test is needed, ask your health care provider about which type is best for you.  Chest X-ray exam.  Blood tests.  Computed tomography (CT). TREATMENT  Treatment may include:  Treating any underlying conditions. For example, if you have an overactive thyroid, treating the condition may correct atrial fibrillation.  Taking medicine. Medicines may be given to control a rapid heart rate or to prevent blood clots, heart failure, or a stroke.  Having a procedure to correct the rhythm of the heart:  Electrical cardioversion. During electrical cardioversion, a controlled, low-energy shock is delivered to the  heart through your skin. If you have chest pain, very low blood pressure, or sudden heart failure, this procedure may need to be done as an emergency.  Catheter ablation. During this procedure, heart tissues that send the signals that cause atrial fibrillation are destroyed.  Surgical ablation. During this surgery, thin lines of heart tissue that carry the abnormal signals are destroyed. This procedure can either be an open-heart surgery or a minimally invasive surgery. With the minimally invasive surgery, small cuts are made to access the heart instead of a large opening.  Pulmonary venous isolation. During this surgery, tissue around the veins that carry blood from the lungs (pulmonary veins) is destroyed. This tissue is thought to carry the abnormal signals. HOME CARE INSTRUCTIONS   Take medicines only as directed by your health care provider. Some medicines can make atrial fibrillation worse or recur.  If blood thinners were prescribed by your health care provider, take them exactly as directed. Too much blood-thinning medicine can cause bleeding. If you take too little, you will not have the needed protection against stroke and other problems.  Perform blood tests at home if directed by your health care provider. Perform blood tests exactly as directed.  Quit smoking if you smoke.  Do not drink alcohol.  Do not drink caffeinated beverages such as coffee, soda, and some teas. You may drink decaffeinated coffee, soda, or tea.   Maintain a healthy weight.Do not use diet pills unless your health care provider approves. They may make heart problems worse.   Follow diet instructions as directed by your health care provider.  Exercise regularly as directed by your health care provider.  Keep all follow-up visits as directed by your health care provider. This is important. PREVENTION  The following substances can cause atrial fibrillation to recur:   Caffeinated  beverages.  Alcohol.  Certain medicines, especially those used for breathing problems.  Certain herbs and herbal medicines, such as those containing ephedra or ginseng.  Illegal drugs, such as cocaine and amphetamines. Sometimes medicines are given to prevent atrial fibrillation from recurring. Proper treatment of any underlying condition is also important in helping prevent recurrence.  SEEK MEDICAL CARE IF:  You notice a change in the rate, rhythm, or strength of your heartbeat.  You suddenly begin urinating more frequently.  You tire more easily when exerting yourself or exercising. SEEK IMMEDIATE MEDICAL CARE IF:   You have chest pain, abdominal pain, sweating, or weakness.  You feel nauseous.  You have shortness of breath.  You suddenly have swollen feet and ankles.  You feel dizzy.  Your face or limbs feel numb or weak.  You have a change in your vision or speech. MAKE SURE YOU:   Understand these instructions.  Will watch your condition.  Will get help right away if you are not doing well or get worse. Document Released: 06/27/2005 Document Revised: 11/11/2013 Document Reviewed: 08/07/2012 [facility] Patient Information 2015 [place], [place]. This information is not intended to replace advice given to you by your health care  provider. Make sure you discuss any questions you have with your health care provider.  Information on my medicine - ELIQUIS (apixaban)  This medication education was reviewed with me or my healthcare representative as part of my discharge preparation.  The pharmacist that spoke with me during my hospital stay was:  Dareen Piano, [facility]  Why was Eliquis prescribed for you? Eliquis was prescribed for you to reduce the risk of a blood clot forming that can cause a stroke if you have a medical condition called atrial fibrillation (a type of irregular heartbeat).  What do You need to know about Eliquis ? Take your Eliquis TWICE DAILY -  one tablet in the morning and one tablet in the evening with or without food. If you have difficulty swallowing the tablet whole please discuss with your pharmacist how to take the medication safely.  Take Eliquis exactly as prescribed by your doctor and DO NOT stop taking Eliquis without talking to the doctor who prescribed the medication.  Stopping may increase your risk of developing a stroke.  Refill your prescription before you run out.  After discharge, you should have regular check-up appointments with your healthcare provider that is prescribing your Eliquis.  In the future your dose may need to be changed if your kidney function or weight changes by a significant amount or as you get older.  What do you do if you miss a dose? If you miss a dose, take it as soon as you remember on the same day and resume taking twice daily.  Do not take more than one dose of ELIQUIS at the same time to make up a missed dose.  Important Safety Information A possible side effect of Eliquis is bleeding. You should call your healthcare provider right away if you experience any of the following: ? Bleeding from an injury or your nose that does not stop. ? Unusual colored urine (red or dark brown) or unusual colored stools (red or black). ? Unusual bruising for unknown reasons. ? A serious fall or if you hit your head (even if there is no bleeding).  Some medicines may interact with Eliquis and might increase your risk of bleeding or clotting while on Eliquis. To help avoid this, consult your healthcare provider or pharmacist prior to using any new prescription or non-prescription medications, including herbals, vitamins, non-steroidal anti-inflammatory drugs (NSAIDs) and supplements.  This website has more information on Eliquis (apixaban): http://www.eliquis.com/eliquis/home

## 2014-10-02 NOTE — Progress Notes (Signed)
[facility] PROGRESS NOTE  Bruce Cohen KPT:[id number] DOB: 1940-11-19 DOA: 09/29/2014 PCP: Henrine Screws, MD  Assessment/Plan: 1. New onset A. Fib- heart rate is controlled, continue metoprolol 50 mg po bid. Anti-coagulation with Eliquis started. 2. Pulmonary edema- patient found to have pul edema on  chest x-ray. BNP elevated to 745, IV lasix changed to po. Continue lasix 40 mg po daily. 3. Mild elevation of troponin- troponin was mildly limited to 0.06,for left heart cath done showed mild obstructive CAD. 4. Hypertension- blood pressure is controlled, continue metoprolol. 5. COPD- stable  Code Status: Full code Family Communication: No family at bedside Disposition Plan: possible home in am   Consultants:  Cardiology  Procedures:  None  Antibiotics:  None  HPI/Subjective: 74 year old male with a history of hypertension, LVH, hyperlipidemia, recurrent pericarditis who came to the ED with shortness of breath and dyspnea on exertion. Patient was found to be in atrial fibrillation with RVR. Patient started on Cardizem infusion. Cardiology consulted. Patient also had acute respiratory failure with hypoxia/pulmonary edema started on IV Lasix.  This morning patient feels more short of breath on exertion. Cardiac cath showed mild nonobstructive CAD, severely reduced EF 25% and moderate mitral regurgitation.   Objective: Filed Vitals:   10/02/14 0439  BP: 124/82  Pulse: 93  Temp: 97.9 F (36.6 C)  Resp:     Intake/Output Summary (Last 24 hours) at 10/02/14 1119 Last data filed at 10/02/14 0930  Gross per 24 hour  Intake    100 ml  Output   1300 ml  Net  -1200 ml   Filed Weights   09/30/14 0600 10/01/14 0504 10/02/14 0439  Weight: 91.899 kg (202 lb 9.6 oz) 91.899 kg (202 lb 9.6 oz) 91.989 kg (202 lb 12.8 oz)    Exam:   General:  Appears in no acute distress  Cardiovascular: *S1-S2 irregularly irregular  Respiratory: Bibasilar crackles  Abdomen:  Soft, nontender no organomegaly  Musculoskeletal: No edema of the lower extremities   Data Reviewed: Basic Metabolic Panel:  Recent Labs Lab 09/29/14 1125 09/29/14 2235 09/30/14 0423 10/01/14 0800 10/02/14 0625  NA 139  --  138 136 136  K 3.9  --  3.4* 4.2 4.1  CL 107  --  102 103 102  CO2 26  --  28 26 25   GLUCOSE 106*  --  102* 95 102*  BUN 16  --  16 15 19   CREATININE 1.10  --  1.22 1.16 1.14  CALCIUM 8.4  --  8.7 8.7 8.9  MG  --  1.9  --   --   --    Liver Function Tests:  Recent Labs Lab 09/29/14 1125 09/30/14 0423  AST 13 13  ALT 11 9  ALKPHOS 84 77  BILITOT 1.2 1.2  PROT 7.1 6.9  ALBUMIN 3.4* 3.2*   No results for input(s): LIPASE, AMYLASE in the last 168 hours. No results for input(s): AMMONIA in the last 168 hours. CBC:  Recent Labs Lab 09/29/14 1125 09/30/14 0423 10/01/14 0800 10/02/14 0625  WBC 12.8* 9.1 8.5 9.8  HGB 14.0 13.5 13.6 14.1  HCT 41.7 40.4 40.5 42.3  MCV 93.5 92.7 92.5 93.6  PLT 337 346 341 343   Cardiac Enzymes:  Recent Labs Lab 09/29/14 1827 09/29/14 2235 09/30/14 0423  TROPONINI 0.05* 0.06* 0.06*   BNP (last 3 results)  Recent Labs  09/29/14 1125  BNP 745.9*    ProBNP (last 3 results) No results for input(s): PROBNP in the last  8760 hours.  CBG: No results for input(s): GLUCAP in the last 168 hours.  Recent Results (from the past 240 hour(s))  Blood culture (routine x 2)     Status: None (Preliminary result)   Collection Time: 09/29/14 12:50 PM  Result Value Ref Range Status   Specimen Description BLOOD RIGHT ANTECUBITAL  Final   Special Requests BOTTLES DRAWN AEROBIC AND ANAEROBIC 10MLS  Final   Culture   Final           BLOOD CULTURE RECEIVED NO GROWTH TO DATE CULTURE WILL BE HELD FOR 5 DAYS BEFORE ISSUING A FINAL NEGATIVE REPORT Performed at [facility]    Report Status PENDING  Incomplete  Blood culture (routine x 2)     Status: None (Preliminary result)   Collection Time: 09/29/14  1:00 PM   Result Value Ref Range Status   Specimen Description BLOOD RIGHT HAND  Final   Special Requests BOTTLES DRAWN AEROBIC AND ANAEROBIC 5CC  Final   Culture   Final           BLOOD CULTURE RECEIVED NO GROWTH TO DATE CULTURE WILL BE HELD FOR 5 DAYS BEFORE ISSUING A FINAL NEGATIVE REPORT Performed at [facility]    Report Status PENDING  Incomplete  MRSA PCR Screening     Status: None   Collection Time: 09/29/14  5:17 PM  Result Value Ref Range Status   MRSA by PCR NEGATIVE NEGATIVE Final    Comment:        The GeneXpert MRSA Assay (FDA approved for NASAL specimens only), is one component of a comprehensive MRSA colonization surveillance program. It is not intended to diagnose MRSA infection nor to guide or monitor treatment for MRSA infections.      Studies: No results found.  Scheduled Meds: . antiseptic oral rinse  7 mL Mouth Rinse BID  . apixaban  5 mg Oral BID  . docusate sodium  100 mg Oral BID  . folic acid  1 mg Oral Daily  . furosemide  40 mg Oral Daily  . LORazepam  0-4 mg Intravenous Q12H  . losartan  25 mg Oral Daily  . metoprolol tartrate  50 mg Oral BID  . metoprolol tartrate  25 mg Oral Once  . multivitamin with minerals  1 tablet Oral Daily  . rosuvastatin  20 mg Oral q1800  . sodium chloride  3 mL Intravenous Q12H  . thiamine  100 mg Oral Daily   Or  . thiamine  100 mg Intravenous Daily   Continuous Infusions: . sodium chloride 10 mL/hr at 10/01/14 7903    Active Problems:   Acute respiratory failure with hypoxia   Pulmonary edema, acute   HTN (hypertension)   Patient nonadherence   COPD (chronic obstructive pulmonary disease)   Tobacco abuse   CKD (chronic kidney disease), stage II   Acute CHF   Atrial fibrillation with RVR   Community acquired pneumonia   Shortness of breath    Time spent: 25 min    [facility] Pager [phone]. If 7PM-7AM, please contact night-coverage at [email or web address], password  [facility] 10/02/2014, 11:19 AM  LOS: 3 days

## 2014-10-02 NOTE — Progress Notes (Signed)
    Subjective:  Patient feels short of breath this morning with activity. He is comfortable at rest. Denies orthopnea. No chest pain.  Objective:  Vital Signs in the last 24 hours: Temp:  [97.6 F (36.4 C)-98.1 F (36.7 C)] 97.9 F (36.6 C) (03/24 0439) Pulse Rate:  [65-109] 93 (03/24 0439) Resp:  [15] 15 (03/23 1515) BP: (110-133)/(67-90) 124/82 mmHg (03/24 0439) SpO2:  [82 %-100 %] 98 % (03/24 0439) Weight:  [202 lb 12.8 oz (91.989 kg)] 202 lb 12.8 oz (91.989 kg) (03/24 0439)  Intake/Output from previous day: 03/23 0701 - 03/24 0700 In: -  Out: 2000 [Urine:2000]  Physical Exam: Pt is alert and oriented, NAD HEENT: normal Neck: JVP - normal, carotids 2+= without bruits Lungs: CTA bilaterally CV: Irregularly irregular without murmur or gallop Abd: soft, NT, Positive BS, no hepatomegaly Ext: no C/C/E, distal pulses intact and equal Skin: warm/dry no rash   Lab Results:  Recent Labs  10/01/14 0800 10/02/14 0625  WBC 8.5 9.8  HGB 13.6 14.1  PLT 341 343    Recent Labs  10/01/14 0800 10/02/14 0625  NA 136 136  K 4.2 4.1  CL 103 102  CO2 26 25  GLUCOSE 95 102*  BUN 15 19  CREATININE 1.16 1.14    Recent Labs  09/29/14 2235 09/30/14 0423  TROPONINI 0.06* 0.06*    Cardiac Studies: 2-D echocardiogram 10/01/2014: Study Conclusions  - Left ventricle: The cavity size was normal. There was mild concentric hypertrophy. Systolic function was moderately to severely reduced. The estimated ejection fraction was in the range of 30% to 35%. There is hypokinesis of the inferior myocardium. - Mitral valve: Calcified annulus. Mildly thickened leaflets . There was mild regurgitation. - Left atrium: The atrium was mildly dilated. - Right atrium: The atrium was mildly dilated. - Pulmonic valve: There was moderate regurgitation.  Tele: Atrial fibrillation heart rate in the 80s  Assessment/Plan:  1. Atrial fibrillation, unknown duration 2.  Cardiomyopathy, nonischemic, possibly tachycardia-mediated 3. Nonobstructive CAD 4. Acute systolic heart failure  Reviewed cardiac catheterization findings with the patient. Primary issue is congestive heart failure related to nonischemic cardiomyopathy and atrial fibrillation. Anticoagulation has been started with Eliquis. Will consolidate metoprolol to 50 mg twice daily. Add losartan 25 mg daily. Continue oral furosemide. We'll reevaluate tomorrow morning and consider discharge home tomorrow as long as he is stable. I would anticipate elective outpatient cardioversion after 3 weeks of anticoagulation.  Sherren Mocha, M.D. 10/02/2014, 9:58 AM

## 2014-10-03 LAB — BASIC METABOLIC PANEL
Anion gap: 5 (ref 5–15)
BUN: 22 mg/dL (ref 6–23)
CHLORIDE: 105 mmol/L (ref 96–112)
CO2: 28 mmol/L (ref 19–32)
CREATININE: 1.34 mg/dL (ref 0.50–1.35)
Calcium: 8.8 mg/dL (ref 8.4–10.5)
GFR calc non Af Amer: 51 mL/min — ABNORMAL LOW (ref >90)
GFR, EST AFRICAN AMERICAN: 59 mL/min — AB (ref >90)
Glucose, Bld: 94 mg/dL (ref 70–99)
POTASSIUM: 4.2 mmol/L (ref 3.5–5.1)
Sodium: 138 mmol/L (ref 135–145)

## 2014-10-03 MED ORDER — LOSARTAN POTASSIUM 25 MG PO TABS: 25.0000 mg | ORAL_TABLET | Freq: Every day | ORAL | Status: DC

## 2014-10-03 MED ORDER — METOPROLOL TARTRATE 50 MG PO TABS: 50.0000 mg | ORAL_TABLET | Freq: Two times a day (BID) | ORAL | Status: DC

## 2014-10-03 MED ORDER — APIXABAN 5 MG PO TABS: 5.0000 mg | ORAL_TABLET | Freq: Two times a day (BID) | ORAL | Status: DC

## 2014-10-03 MED ORDER — THIAMINE HCL 100 MG PO TABS: 100.0000 mg | ORAL_TABLET | Freq: Every day | ORAL | Status: DC

## 2014-10-03 MED ORDER — FOLIC ACID 1 MG PO TABS: 1.0000 mg | ORAL_TABLET | Freq: Every day | ORAL | Status: DC

## 2014-10-03 MED ORDER — FUROSEMIDE 40 MG PO TABS: 40.0000 mg | ORAL_TABLET | Freq: Every day | ORAL | Status: DC

## 2014-10-03 NOTE — Discharge Summary (Addendum)
Physician Discharge Summary  Bruce Cohen:[id number] DOB: 29-Apr-1941 DOA: 09/29/2014  PCP: Henrine Screws, MD  Admit date: 09/29/2014 Discharge date: 10/03/2014  Time spent: 50* minutes  Recommendations for Outpatient Follow-up:  1. Follow up cardiology in three weeks  Discharge Diagnoses:  Active Problems:   Acute respiratory failure with hypoxia   Pulmonary edema, acute   HTN (hypertension)   Patient nonadherence   COPD (chronic obstructive pulmonary disease)   Tobacco abuse   CKD (chronic kidney disease), stage II   Acute CHF   Atrial fibrillation with RVR   Community acquired pneumonia   Shortness of breath   Discharge Condition: Stable  Diet recommendation: Low salt diet  Filed Weights   10/01/14 0504 10/02/14 0439 10/03/14 0400  Weight: 91.899 kg (202 lb 9.6 oz) 91.989 kg (202 lb 12.8 oz) 91.173 kg (201 lb)    History of present illness:  74 year old male with a history of hypertension, LVH, hyperlipidemia, recurrent pericarditis who came to the ED with shortness of breath and dyspnea on exertion. Patient was found to be in atrial fibrillation with RVR. Patient started on Cardizem infusion. Cardiology consulted. Patient also had acute respiratory failure with hypoxia/pulmonary edema started on IV Lasix.  . Cardiac cath showed mild nonobstructive CAD, severely reduced EF 25% and moderate mitral regurgitation.   Hospital Course:   1. New onset A. Fib- heart rate is controlled, continue metoprolol 50 mg po bid. Anti-coagulation with Eliquis started. Patient to follow-up cardiology in 3 weeks for cardioversion. 2. Pulmonary edema- patient found to have pul edema on chest x-ray. BNP elevated to 745, IV lasix changed to po. Continue lasix 40 mg po daily. 3. Mild elevation of troponin- troponin was mildly limited to 0.06,for left heart cath done showed mild obstructive CAD. 4. Hypertension- blood pressure is controlled, continue metoprolol,  Cozaar. 5. Hyperlipidemia- continue rosuvastatin. 6. Alcohol abuse- patient was started on CIWA protocol. No signs and symptoms of alcohol withdrawal at this time. Patient discharged on  folic acid 1 mg daily thiamine 100 g daily.  Procedures:  None  Consultations:  Cardiology  Discharge Exam: Filed Vitals:   10/03/14 1024  BP: 127/72  Pulse: 94  Temp:   Resp:     General: Appears in no acute distress Cardiovascular: S1-S2 is regular Respiratory: Clear to auscultation bilaterally  Discharge Instructions   Discharge Instructions    Diet - low sodium heart healthy    Complete by:  As directed      Increase activity slowly    Complete by:  As directed           Current Discharge Medication List    START taking these medications   Details  apixaban (ELIQUIS) 5 MG TABS tablet Take 1 tablet (5 mg total) by mouth 2 (two) times daily. Qty: 60 tablet, Refills: 2    folic acid (FOLVITE) 1 MG tablet Take 1 tablet (1 mg total) by mouth daily. Qty: 30 tablet, Refills: 2    furosemide (LASIX) 40 MG tablet Take 1 tablet (40 mg total) by mouth daily. Qty: 30 tablet, Refills: 2    losartan (COZAAR) 25 MG tablet Take 1 tablet (25 mg total) by mouth daily. Qty: 30 tablet, Refills: 2    metoprolol (LOPRESSOR) 50 MG tablet Take 1 tablet (50 mg total) by mouth 2 (two) times daily. Qty: 60 tablet, Refills: 2    thiamine 100 MG tablet Take 1 tablet (100 mg total) by mouth daily. Qty: 30 tablet, Refills: 2  CONTINUE these medications which have NOT CHANGED   Details  cholecalciferol (VITAMIN D) 1000 UNITS tablet 1,000 Units. 2 TABLETS DAILY    amLODipine (NORVASC) 5 MG tablet Take 5 mg by mouth daily.     Multiple Vitamins-Minerals (MULTIVITAMIN PO) Take by mouth daily.    rosuvastatin (CRESTOR) 20 MG tablet Take 20 mg by mouth daily.      STOP taking these medications     aspirin 81 MG tablet      methylPREDNISolone (MEDROL DOSEPAK) 4 MG tablet       valsartan-hydrochlorothiazide (DIOVAN-HCT) 160-12.5 MG per tablet      Vitamin D, Ergocalciferol, (DRISDOL) 50000 UNITS CAPS capsule        No Known Allergies Follow-up Information    Follow up with Richardson Dopp, PA-C On 10/27/2014.   Specialty:  Physician Assistant   Why:  8:30 am (Dr. Antionette Char PA)   Contact information:   [address] [place] [address] [phone]        The results of significant diagnostics from this hospitalization (including imaging, microbiology, ancillary and laboratory) are listed below for reference.    Significant Diagnostic Studies: Dg Chest 2 View  09/29/2014   CLINICAL DATA:  Shortness of breath for 5 days.  EXAM: CHEST  2 VIEW  COMPARISON:  None.  FINDINGS: There is a degree of underlying emphysema. There is widespread interstitial opacity throughout the lungs diffusely. There is some patchy airspace consolidation in the upper lobes, more on the right than on the left. The heart is borderline enlarged with pulmonary vascularity within normal limits. No effusions. No adenopathy. No bone lesions.  IMPRESSION: Question a degree of congestive heart failure superimposed on emphysema. All of the changes which are present may represent interstitial fibrosis as opposed to edema. Without prior studies, this differentiation is difficult. Patchy opacity in the upper lobes potentially could also represent a degree of superimposed pneumonia. Given these changes, a followup study in 5-7 days is advised to assess for stability.   Electronically Signed   By: Lowella Grip III M.D.   On: 09/29/2014 11:44   Ct Chest High Resolution  09/29/2014   CLINICAL DATA:  74 year old male with history of pulmonary fibrosis. Shortness of breath.  EXAM: CT CHEST WITHOUT CONTRAST  TECHNIQUE: Multidetector CT imaging of the chest was performed following the standard protocol without intravenous contrast. High resolution imaging of the lungs, as well as inspiratory  and expiratory imaging, was performed.  COMPARISON:  No priors.  FINDINGS: Mediastinum/Lymph Nodes: Heart size is mildly enlarged. There is no significant pericardial fluid, thickening or pericardial calcification. Extensive calcifications of the mitral annulus inferiorly. There is atherosclerosis of the thoracic aorta, the great vessels of the mediastinum and the coronary arteries, including calcified atherosclerotic plaque in the left anterior descending and right coronary arteries. Multiple borderline enlarged and mildly enlarged mediastinal and hilar lymph nodes bilaterally, measuring up to 17 mm in short axis in the low right paratracheal station. Hilar lymphadenopathy is poorly evaluated on today's non contrast CT examination. Esophagus is unremarkable in appearance. No axillary lymphadenopathy.  Lungs/Pleura: High-resolution images demonstrate patchy areas of ground-glass attenuation, septal thickening and some scattered areas of subpleural reticulation. Study is limited by considerable patient respiratory motion. There is mild diffuse bronchial wall thickening. No definite bronchiectasis. No honeycombing. No clear cut craniocaudal gradient to these findings, with greater extent of ground-glass attenuation in the apices. No confluent consolidative airspace disease. Inspiratory and expiratory imaging is unremarkable. Small bilateral  pleural effusions (right greater than left) layering dependently.  Upper Abdomen: 1.9 cm low-attenuation lesion in the lateral aspect of segment 3 of the liver is incompletely characterized, but statistically likely a small cyst.  Musculoskeletal/Soft Tissues: There are no aggressive appearing lytic or blastic lesions noted in the visualized portions of the skeleton.  IMPRESSION: 1. The appearance of the lungs is not strongly favored to reflect interstitial lung disease. Rather, these findings are favored to reflect interstitial pulmonary edema in the setting of congestive heart  failure. Clinical correlation is suggested. If there is persistent clinical concern for interstitial lung disease, repeat high-resolution chest CT could be performed when the patient is not acutely ill and better able to hold his breath in the next 2-3 months. Other differential considerations would include atypical multilobar pneumonia, however, that is not strongly favored. Clinical correlation is suggested. 2. Small bilateral pleural effusions (right greater than left) layering dependently. 3. Mediastinal and bilateral hilar lymphadenopathy is nonspecific. This could be seen in the setting of congestive failure, infection, and/or interstitial lung disease. Attention at time of repeat high-resolution chest CT is suggested if of clinical concern. 4. Atherosclerosis, including two vessel coronary artery disease. Assessment for potential risk factor modification, dietary therapy or pharmacologic therapy may be warranted, if clinically indicated.   Electronically Signed   By: Vinnie Langton M.D.   On: 09/29/2014 16:47    Microbiology: Recent Results (from the past 240 hour(s))  Blood culture (routine x 2)     Status: None (Preliminary result)   Collection Time: 09/29/14 12:50 PM  Result Value Ref Range Status   Specimen Description BLOOD RIGHT ANTECUBITAL  Final   Special Requests BOTTLES DRAWN AEROBIC AND ANAEROBIC 10MLS  Final   Culture   Final           BLOOD CULTURE RECEIVED NO GROWTH TO DATE CULTURE WILL BE HELD FOR 5 DAYS BEFORE ISSUING A FINAL NEGATIVE REPORT Performed at [facility]    Report Status PENDING  Incomplete  Blood culture (routine x 2)     Status: None (Preliminary result)   Collection Time: 09/29/14  1:00 PM  Result Value Ref Range Status   Specimen Description BLOOD RIGHT HAND  Final   Special Requests BOTTLES DRAWN AEROBIC AND ANAEROBIC 5CC  Final   Culture   Final           BLOOD CULTURE RECEIVED NO GROWTH TO DATE CULTURE WILL BE HELD FOR 5 DAYS BEFORE ISSUING A  FINAL NEGATIVE REPORT Performed at [facility]    Report Status PENDING  Incomplete  MRSA PCR Screening     Status: None   Collection Time: 09/29/14  5:17 PM  Result Value Ref Range Status   MRSA by PCR NEGATIVE NEGATIVE Final    Comment:        The GeneXpert MRSA Assay (FDA approved for NASAL specimens only), is one component of a comprehensive MRSA colonization surveillance program. It is not intended to diagnose MRSA infection nor to guide or monitor treatment for MRSA infections.      Labs: Basic Metabolic Panel:  Recent Labs Lab 09/29/14 1125 09/29/14 2235 09/30/14 0423 10/01/14 0800 10/02/14 0625 10/03/14 0506  NA 139  --  138 136 136 138  K 3.9  --  3.4* 4.2 4.1 4.2  CL 107  --  102 103 102 105  CO2 26  --  28 26 25 28   GLUCOSE 106*  --  102* 95 102* 94  BUN  16  --  16 15 19 22   CREATININE 1.10  --  1.22 1.16 1.14 1.34  CALCIUM 8.4  --  8.7 8.7 8.9 8.8  MG  --  1.9  --   --   --   --    Liver Function Tests:  Recent Labs Lab 09/29/14 1125 09/30/14 0423  AST 13 13  ALT 11 9  ALKPHOS 84 77  BILITOT 1.2 1.2  PROT 7.1 6.9  ALBUMIN 3.4* 3.2*   No results for input(s): LIPASE, AMYLASE in the last 168 hours. No results for input(s): AMMONIA in the last 168 hours. CBC:  Recent Labs Lab 09/29/14 1125 09/30/14 0423 10/01/14 0800 10/02/14 0625  WBC 12.8* 9.1 8.5 9.8  HGB 14.0 13.5 13.6 14.1  HCT 41.7 40.4 40.5 42.3  MCV 93.5 92.7 92.5 93.6  PLT 337 346 341 343   Cardiac Enzymes:  Recent Labs Lab 09/29/14 1827 09/29/14 2235 09/30/14 0423  TROPONINI 0.05* 0.06* 0.06*   BNP: BNP (last 3 results)  Recent Labs  09/29/14 1125  BNP 745.9*    ProBNP (last 3 results) No results for input(s): PROBNP in the last 8760 hours.  CBG: No results for input(s): GLUCAP in the last 168 hours.     SignedEleonore Chiquito S  [facility] 10/03/2014, 12:58 PM

## 2014-10-03 NOTE — Progress Notes (Signed)
Patient Profile; 73 y/o M with history of 60-years tobacco use, prior recurrent pericarditis, HTN, HLD, LVH, possible undisclosed EtOH use who presented to [facility] with 4-day history of dyspnea and newly recognized AF RVR. Also with acute systolic CHF.   Subjective: No complaints. Asymptomatic.   Objective: Vital signs in last 24 hours: Temp:  [97.6 F (36.4 C)-98.7 F (37.1 C)] 98.7 F (37.1 C) (03/25 0758) Pulse Rate:  [79-94] 94 (03/25 1024) Resp:  [18] 18 (03/24 2026) BP: (116-133)/(72-96) 127/72 mmHg (03/25 1024) SpO2:  [94 %-98 %] 98 % (03/25 0400) Weight:  [201 lb (91.173 kg)] 201 lb (91.173 kg) (03/25 0400) Last BM Date: 09/29/14  Intake/Output from previous day: 03/24 0701 - 03/25 0700 In: 160 [P.O.:160] Out: 1075 [Urine:1075] Intake/Output this shift: Total I/O In: 3 [I.V.:3] Out: 350 [Urine:350]  Medications Current Facility-Administered Medications  Medication Dose Route Frequency Provider Last Rate Last Dose  . 0.9 %  sodium chloride infusion   Intravenous Continuous Evelene Croon Barrett, PA-C 10 mL/hr at 10/01/14 0850    . acetaminophen (TYLENOL) tablet 650 mg  650 mg Oral Q6H PRN Samella Parr, NP   650 mg at 09/29/14 2224   Or  . acetaminophen (TYLENOL) suppository 650 mg  650 mg Rectal Q6H PRN Samella Parr, NP      . alum & mag hydroxide-simeth (MAALOX/MYLANTA) 200-200-20 MG/5ML suspension 30 mL  30 mL Oral Q6H PRN Samella Parr, NP      . antiseptic oral rinse (CPC / CETYLPYRIDINIUM CHLORIDE 0.05%) solution 7 mL  7 mL Mouth Rinse BID Verlee Monte, MD   7 mL at 10/03/14 1025  . apixaban (ELIQUIS) tablet 5 mg  5 mg Oral BID Kris Mouton, RPH   5 mg at 10/03/14 1023  . docusate sodium (COLACE) capsule 100 mg  100 mg Oral BID Samella Parr, NP   100 mg at 10/03/14 1023  . folic acid (FOLVITE) tablet 1 mg  1 mg Oral Daily Samella Parr, NP   1 mg at 10/03/14 1023  . furosemide (LASIX) tablet 40 mg  40 mg Oral Daily Wellington Hampshire, MD   40  mg at 10/03/14 1023  . LORazepam (ATIVAN) injection 0-4 mg  0-4 mg Intravenous Q12H Samella Parr, NP      . losartan (COZAAR) tablet 25 mg  25 mg Oral Daily Sherren Mocha, MD   25 mg at 10/03/14 1024  . magnesium hydroxide (MILK OF MAGNESIA) suspension 30 mL  30 mL Oral Daily PRN Samella Parr, NP      . metoprolol (LOPRESSOR) tablet 50 mg  50 mg Oral BID Sherren Mocha, MD   50 mg at 10/03/14 1024  . morphine 2 MG/ML injection 1 mg  1 mg Intravenous Q4H PRN Samella Parr, NP      . multivitamin with minerals tablet 1 tablet  1 tablet Oral Daily Samella Parr, NP   1 tablet at 10/03/14 1023  . ondansetron (ZOFRAN) tablet 4 mg  4 mg Oral Q6H PRN Samella Parr, NP       Or  . ondansetron [facility]) injection 4 mg  4 mg Intravenous Q6H PRN Samella Parr, NP      . oxyCODONE (Oxy IR/ROXICODONE) immediate release tablet 5 mg  5 mg Oral Q4H PRN Samella Parr, NP      . rosuvastatin (CRESTOR) tablet 20 mg  20 mg Oral q1800 Samella Parr, NP  20 mg at 10/02/14 2022  . sodium chloride 0.9 % injection 3 mL  3 mL Intravenous Q12H Samella Parr, NP   3 mL at 10/03/14 1025  . thiamine (VITAMIN B-1) tablet 100 mg  100 mg Oral Daily Samella Parr, NP   100 mg at 10/03/14 1023   Or  . thiamine (B-1) injection 100 mg  100 mg Intravenous Daily Samella Parr, NP      . traZODone (DESYREL) tablet 25 mg  25 mg Oral QHS PRN Samella Parr, NP   25 mg at 10/01/14 2356    PE: General appearance: alert, cooperative and no distress Neck: no carotid bruit and no JVD Lungs: faint rales over the right base that improve with cough Heart: irregularly irregular rhythm Extremities: no LEE Pulses: 2+ and symmetric Skin: warm and dry Neurologic: Grossly normal  Lab Results:   Recent Labs  10/01/14 0800 10/02/14 0625  WBC 8.5 9.8  HGB 13.6 14.1  HCT 40.5 42.3  PLT 341 343   BMET  Recent Labs  10/01/14 0800 10/02/14 0625 10/03/14 0506  NA 136 136 138  K 4.2 4.1 4.2  CL 103 102 105   CO2 26 25 28   GLUCOSE 95 102* 94  BUN 15 19 22   CREATININE 1.16 1.14 1.34  CALCIUM 8.7 8.9 8.8    Assessment/Plan  Active Problems:   Acute respiratory failure with hypoxia   Pulmonary edema, acute   HTN (hypertension)   Patient nonadherence   COPD (chronic obstructive pulmonary disease)   Tobacco abuse   CKD (chronic kidney disease), stage II   Acute CHF   Atrial fibrillation with RVR   Community acquired pneumonia   Shortness of breath   1. Atrial Fibrillation w/ RVR: rate is controlled in the 80s. Asymptomatic. Continue rate control with metoprolol. Continue Eliquis for stoke prophylaxis (tolerating well w/o signs of abnormal bleeding. H/H stable). Anticipate elective outpatient cardioversion after 3 weeks of anticoagulation, if still in afib.   2. Cardiomyopathy, nonischemic, possibly tachycardia-mediated: EF 30-35%. No dyspnea. Continue BB and ARB therapy.   3. Nonobstructive CAD: continue medical therapy. No ASA due to Eliquis. Risk factor reduction with control of BP and lipids: BB, ARB and statin.   4. Acute systolic heart failure: Now euvolemic on exam. No dyspnea. I/Os net negative 5.5L. Continue PO Lasix. Stress daily weights and low sodium diet on discharge. Continue BB and ARB.   Dispo: Primary team with d/c. We will arrange OP cardiology f/u.    LOS: 4 days    Brittainy M. Ladoris Gene 10/03/2014 10:35 AM  Patient seen, examined. Available data reviewed. Agree with findings, assessment, and plan as outlined by Lyda Jester, PA-C. The patient is independently interviewed and examined. His heart rate is well controlled today. I have reviewed his medical program and would continue the same without changes. I am hopeful that with alcohol cessation and control of his atrial fibrillation, we will see improvement in LV function. Will arrange elective outpatient cardioversion after 3 weeks of anticoagulation.  Sherren Mocha, M.D. 10/03/2014 12:23 PM

## 2014-10-05 LAB — CULTURE, BLOOD (ROUTINE X 2)
CULTURE: NO GROWTH
Culture: NO GROWTH

## 2014-10-26 NOTE — Progress Notes (Signed)
Cardiology Office Note   Date:  10/27/2014   ID:  Bruce Cohen, DOB 1941/03/13, MRN [id number]  PCP:  Henrine Screws, MD  Cardiologist:  Dr. Casandra Doffing     Chief Complaint  Patient presents with  . Hospitalization Follow-up    Admx with Acute Systolic HF in setting of AFib with RVR     History of Present Illness: Bruce Cohen is a 74 y.o. male with a hx of recurrent pericarditis, HTN, HL.  Admitted 3/21-3/25 with acute systolic HF in the setting of AFib with RVR.  Troponins were elevated.  Echo demonstrated reduced LVF with EF 30-35%.  LHC demonstrated mild non-obstructive CAD.  NICM was thought to likely be due to tachycardia.  There was also concern for ETOH abuse.  HF medications were adjusted.  OP FU was planned with eye towards DCCV after 3 weeks of uninterrupted anticoagulation if still in AFib.  He returns for FU.  Overall doing well.  Breathing is improved. He is NYHA 2-2b.  No orthopnea, PND, edema.  No syncope.  No chest pain.  No bleeding issues.   Studies/Reports Reviewed Today:  LHC 10/01/14 LM:  Normal LAD:   20% proximal 20% mid LCx:   minor irregularities. RCA:  20% proximal   LVEF:  25 %, there is moderate mitral regurgitation .   Echo 10/01/14 - Mild concentric hypertrophy. EF 30% to 35%. There is hypokinesis of the inferior myocardium. - Mitral valve: Calcified annulus. Mildly thickened leaflets. There was mild regurgitation. - Left atrium: The atrium was mildly dilated. - Right atrium: The atrium was mildly dilated. - Pulmonic valve: There was moderate regurgitation.   Past Medical History  Diagnosis Date  . LVH (left ventricular hypertrophy)   . Mitral regurgitation   . Pericarditis     a. Hospitalized in [place]. Episode in 1998. Had a cath which was clean (also in 1990s). 2 further episodes that were mild.  . Hyperlipidemia   . Hypertension   . Atrial fibrillation with RVR 09/2014    Newly recognized/notes 09/29/2014  . Heart  murmur     Past Surgical History  Procedure Laterality Date  . Cardiac catheterization  1998    "clean"  . Tonsillectomy    . Appendectomy    . Back surgery    . Lumbar laminectomy  1970's    "partial"  . Orif finger / thumb fracture Left ~ 2006    "thumb"  . Fracture surgery    . Left heart catheterization with coronary angiogram N/A 10/01/2014    Procedure: LEFT HEART CATHETERIZATION WITH CORONARY ANGIOGRAM;  Surgeon: Wellington Hampshire, MD;  Location: [facility] CATH [facility];  Service: Cardiovascular;  Laterality: N/A;     Current Outpatient Prescriptions  Medication Sig Dispense Refill  . amLODipine (NORVASC) 5 MG tablet Take 5 mg by mouth daily.     Marland Kitchen apixaban (ELIQUIS) 5 MG TABS tablet Take 1 tablet (5 mg total) by mouth 2 (two) times daily. 60 tablet 2  . cholecalciferol (VITAMIN D) 1000 UNITS tablet 1,000 Units. 2 TABLETS DAILY    . folic acid (FOLVITE) 1 MG tablet Take 1 tablet (1 mg total) by mouth daily. 30 tablet 2  . furosemide (LASIX) 40 MG tablet Take 1 tablet (40 mg total) by mouth daily. 30 tablet 2  . losartan (COZAAR) 50 MG tablet Take 1 tablet (50 mg total) by mouth daily. 30 tablet 11  . metoprolol (LOPRESSOR) 50 MG tablet Take 1 tablet (50 mg  total) by mouth 2 (two) times daily. 60 tablet 2  . Multiple Vitamins-Minerals (MULTIVITAMIN PO) Take 1 tablet by mouth daily.     . rosuvastatin (CRESTOR) 20 MG tablet Take 20 mg by mouth daily.    Marland Kitchen thiamine 100 MG tablet Take 1 tablet (100 mg total) by mouth daily. 30 tablet 2   No current facility-administered medications for this visit.    Allergies:   Review of patient's allergies indicates no known allergies.    Social History:  The patient  reports that he has been smoking Cigarettes.  He has a 15 pack-year smoking history. He has never used smokeless tobacco. He reports that he drinks alcohol. He reports that he does not use illicit drugs.   Family History:  The patient's family history includes Heart disease in his  mother; Heart failure in his father. There is no history of CAD.    ROS:   Please see the history of present illness.   Review of Systems  Cardiovascular: Positive for dyspnea on exertion.  All other systems reviewed and are negative.     PHYSICAL EXAM: VS:  BP 134/98 mmHg  Pulse 79  Ht 6' 2.5" (1.892 m)  Wt 208 lb 6.4 oz (94.53 kg)  BMI 26.41 kg/m2    Wt Readings from Last 3 Encounters:  10/27/14 208 lb 6.4 oz (94.53 kg)  10/03/14 201 lb (91.173 kg)  06/27/13 228 lb (103.42 kg)     GEN: Well nourished, well developed, in no acute distress HEENT: normal Neck: no JVD, no masses Cardiac:  Normal S1/S2, irreg irreg rhythm; no murmur ,  no rubs or gallops, no edema; right wrist without hematoma or mass   Respiratory:  clear to auscultation bilaterally, no wheezing, rhonchi or rales. GI: soft, nontender, nondistended, + BS MS: no deformity or atrophy Skin: warm and dry  Neuro:  CNs II-XII intact, Strength and sensation are intact Psych: Normal affect   EKG:  EKG is ordered today.  It demonstrates:   AFib, HR 79, no sig changed since prior tracing.   Recent Labs: 09/29/2014: B Natriuretic Peptide 745.9*; Magnesium 1.9; TSH 2.081 09/30/2014: ALT 9 10/02/2014: Hemoglobin 14.1; Platelets 343 10/27/2014: BUN 20; Creatinine 1.43; Potassium 3.8; Sodium 139    Lipid Panel    Component Value Date/Time   CHOL 116 09/30/2014 0423   TRIG 87 09/30/2014 0423   HDL 30* 09/30/2014 0423   CHOLHDL 3.9 09/30/2014 0423   VLDL 17 09/30/2014 0423   LDLCALC 69 09/30/2014 0423      ASSESSMENT AND PLAN:  Chronic systolic CHF (congestive heart failure) Volume stable.  Continue current Rx.  He is NYHA 2-2b.  Check BMET today.  NICM (nonischemic cardiomyopathy) Possible tachy mediated.  Rate now well controlled.  Continue current dose of beta-blocker.  BP too high.  Increase Losartan to 50 mg QD.  Check BMET in 1 week.  Will need to recheck Echo once he is back in NSR.  If EF remains <  35%, refer to EP for ICD.  PAF (paroxysmal atrial fibrillation) He likely missed 1-2 doses of Eliquis.  We discussed the importance of uninterrupted anticoagulation before DCCV.  He understands.  Continue Eliquis.  He is fairly asymptomatic and his HR is controlled.  Will see him back in 2-3 weeks.  If adherence is good, proceed with elective DCCV.    Essential hypertension BP too high.  Increase Losartan as noted.   Hyperlipidemia Continue statin.  Tobacco abuse  He has stopped  smoking.   Current medicines are reviewed at length with the patient today.  The patient does not have concerns regarding medicines.  The following changes have been made:    Increase Losartan to 50 mg QD.  Labs/ tests ordered today include:  Orders Placed This Encounter  Procedures  . Basic Metabolic Panel (BMET)  . Basic Metabolic Panel (BMET)  . EKG 12-Lead    Disposition:   FU with me or Dr. Casandra Doffing in 2-3 weeks.   Signed, Versie Starks, MHS 10/27/2014 8:48 PM    [facility] [place], [place], [place]  [address] Phone: [phone]; Fax: [phone]

## 2014-10-27 ENCOUNTER — Ambulatory Visit (INDEPENDENT_AMBULATORY_CARE_PROVIDER_SITE_OTHER): Payer: Medicare Other | Admitting: Physician Assistant

## 2014-10-27 ENCOUNTER — Encounter: Payer: Self-pay | Admitting: Physician Assistant

## 2014-10-27 VITALS — BP 134/98 | HR 79 | Ht 74.5 in | Wt 208.4 lb

## 2014-10-27 DIAGNOSIS — I429 Cardiomyopathy, unspecified: Secondary | ICD-10-CM | POA: Diagnosis not present

## 2014-10-27 DIAGNOSIS — I10 Essential (primary) hypertension: Secondary | ICD-10-CM | POA: Diagnosis not present

## 2014-10-27 DIAGNOSIS — I480 Paroxysmal atrial fibrillation: Secondary | ICD-10-CM | POA: Diagnosis not present

## 2014-10-27 DIAGNOSIS — I428 Other cardiomyopathies: Secondary | ICD-10-CM

## 2014-10-27 DIAGNOSIS — I5022 Chronic systolic (congestive) heart failure: Secondary | ICD-10-CM

## 2014-10-27 DIAGNOSIS — E785 Hyperlipidemia, unspecified: Secondary | ICD-10-CM

## 2014-10-27 DIAGNOSIS — Z720 Tobacco use: Secondary | ICD-10-CM

## 2014-10-27 LAB — BASIC METABOLIC PANEL
BUN: 20 mg/dL (ref 6–23)
CHLORIDE: 105 meq/L (ref 96–112)
CO2: 28 meq/L (ref 19–32)
Calcium: 9.3 mg/dL (ref 8.4–10.5)
Creatinine, Ser: 1.43 mg/dL (ref 0.40–1.50)
GFR: 51.36 mL/min — ABNORMAL LOW (ref >60.00)
GLUCOSE: 104 mg/dL — AB (ref 70–99)
POTASSIUM: 3.8 meq/L (ref 3.5–5.1)
SODIUM: 139 meq/L (ref 135–145)

## 2014-10-27 MED ORDER — LOSARTAN POTASSIUM 50 MG PO TABS: 50.0000 mg | ORAL_TABLET | Freq: Every day | ORAL | Status: DC

## 2014-10-27 NOTE — Patient Instructions (Signed)
Medication Instructions:  1. INCREASE LOSARTAN TO 50 MG DAILY; NEW RX SENT IN FOR THE 50 MG TABLET  Labwork: 1. TODAY BMET  2. REPEAT BMET IN 1 WEEK  Testing/Procedures: NONE  Follow-Up: 11/19/14 @ 2:20 WITH SCOTT WEAVER, PAC SAME DAY DR. Irish Lack IS IN THE OFFICE  Any Other Special Instructions Will Be Listed Below (If Applicable).

## 2014-10-28 ENCOUNTER — Telehealth: Payer: Self-pay | Admitting: *Deleted

## 2014-10-28 NOTE — Telephone Encounter (Signed)
pt notified about lab results with verbal understanding  

## 2014-11-03 ENCOUNTER — Other Ambulatory Visit (INDEPENDENT_AMBULATORY_CARE_PROVIDER_SITE_OTHER): Payer: Medicare Other | Admitting: *Deleted

## 2014-11-03 DIAGNOSIS — I5022 Chronic systolic (congestive) heart failure: Secondary | ICD-10-CM | POA: Diagnosis not present

## 2014-11-03 DIAGNOSIS — I10 Essential (primary) hypertension: Secondary | ICD-10-CM

## 2014-11-03 LAB — BASIC METABOLIC PANEL
BUN: 18 mg/dL (ref 6–23)
CALCIUM: 9.3 mg/dL (ref 8.4–10.5)
CO2: 28 mEq/L (ref 19–32)
Chloride: 102 mEq/L (ref 96–112)
Creatinine, Ser: 1.23 mg/dL (ref 0.40–1.50)
GFR: 61.11 mL/min (ref >60.00)
GLUCOSE: 94 mg/dL (ref 70–99)
Potassium: 3.6 mEq/L (ref 3.5–5.1)
SODIUM: 136 meq/L (ref 135–145)

## 2014-11-03 NOTE — Addendum Note (Signed)
Addended by: Eulis Foster on: 11/03/2014 11:56 AM   Modules accepted: Orders

## 2014-11-19 ENCOUNTER — Telehealth: Payer: Self-pay | Admitting: *Deleted

## 2014-11-19 ENCOUNTER — Encounter: Payer: Self-pay | Admitting: Physician Assistant

## 2014-11-19 ENCOUNTER — Ambulatory Visit (INDEPENDENT_AMBULATORY_CARE_PROVIDER_SITE_OTHER): Payer: Medicare Other | Admitting: Physician Assistant

## 2014-11-19 ENCOUNTER — Encounter: Payer: Self-pay | Admitting: *Deleted

## 2014-11-19 VITALS — BP 145/97 | HR 86 | Ht 74.5 in | Wt 207.0 lb

## 2014-11-19 DIAGNOSIS — I10 Essential (primary) hypertension: Secondary | ICD-10-CM

## 2014-11-19 DIAGNOSIS — I428 Other cardiomyopathies: Secondary | ICD-10-CM

## 2014-11-19 DIAGNOSIS — I2510 Atherosclerotic heart disease of native coronary artery without angina pectoris: Secondary | ICD-10-CM

## 2014-11-19 DIAGNOSIS — I429 Cardiomyopathy, unspecified: Secondary | ICD-10-CM | POA: Diagnosis not present

## 2014-11-19 DIAGNOSIS — E785 Hyperlipidemia, unspecified: Secondary | ICD-10-CM

## 2014-11-19 DIAGNOSIS — I481 Persistent atrial fibrillation: Secondary | ICD-10-CM

## 2014-11-19 DIAGNOSIS — I5022 Chronic systolic (congestive) heart failure: Secondary | ICD-10-CM

## 2014-11-19 DIAGNOSIS — I4819 Other persistent atrial fibrillation: Secondary | ICD-10-CM

## 2014-11-19 DIAGNOSIS — R0683 Snoring: Secondary | ICD-10-CM

## 2014-11-19 LAB — BASIC METABOLIC PANEL
BUN: 17 mg/dL (ref 6–23)
CHLORIDE: 103 meq/L (ref 96–112)
CO2: 29 mEq/L (ref 19–32)
Calcium: 9.4 mg/dL (ref 8.4–10.5)
Creatinine, Ser: 1.28 mg/dL (ref 0.40–1.50)
GFR: 58.36 mL/min — ABNORMAL LOW (ref >60.00)
GLUCOSE: 96 mg/dL (ref 70–99)
Potassium: 4.1 mEq/L (ref 3.5–5.1)
Sodium: 139 mEq/L (ref 135–145)

## 2014-11-19 LAB — CBC WITH DIFFERENTIAL/PLATELET
Basophils Absolute: 0.1 10*3/uL (ref 0.0–0.1)
Basophils Relative: 0.5 % (ref 0.0–3.0)
EOS PCT: 1.1 % (ref 0.0–5.0)
Eosinophils Absolute: 0.1 10*3/uL (ref 0.0–0.7)
HEMATOCRIT: 44.8 % (ref 39.0–52.0)
HEMOGLOBIN: 15.3 g/dL (ref 13.0–17.0)
Lymphocytes Relative: 25.2 % (ref 12.0–46.0)
Lymphs Abs: 2.6 10*3/uL (ref 0.7–4.0)
MCHC: 34.2 g/dL (ref 30.0–36.0)
MCV: 90 fl (ref 78.0–100.0)
MONOS PCT: 7.8 % (ref 3.0–12.0)
Monocytes Absolute: 0.8 10*3/uL (ref 0.1–1.0)
Neutro Abs: 6.7 10*3/uL (ref 1.4–7.7)
Neutrophils Relative %: 65.4 % (ref 43.0–77.0)
Platelets: 319 10*3/uL (ref 150.0–400.0)
RBC: 4.97 Mil/uL (ref 4.22–5.81)
RDW: 15.5 % (ref 11.5–15.5)
WBC: 10.2 10*3/uL (ref 4.0–10.5)

## 2014-11-19 MED ORDER — ATORVASTATIN CALCIUM 40 MG PO TABS: 40.0000 mg | ORAL_TABLET | Freq: Every day | ORAL | Status: DC

## 2014-11-19 MED ORDER — APIXABAN 5 MG PO TABS: 5.0000 mg | ORAL_TABLET | Freq: Two times a day (BID) | ORAL | Status: AC

## 2014-11-19 NOTE — Progress Notes (Signed)
Cardiology Office Note   Date:  11/19/2014   ID:  TYWAN SIEVER, DOB [birth year], MRN [id number]  PCP:  Henrine Screws, MD  Cardiologist:  Dr. Casandra Doffing     Chief Complaint  Patient presents with  . Atrial Fibrillation  . Labs Only     History of Present Illness: Bruce Cohen is a 74 y.o. male with a hx of recurrent pericarditis, HTN, HL.  Admitted 3/21-3/25 with acute systolic HF in the setting of AFib with RVR.  Troponins were elevated.  Echo demonstrated reduced LVF with EF 30-35%.  LHC demonstrated mild non-obstructive CAD.  NICM was thought to likely be due to tachycardia.  There was also concern for ETOH abuse.  HF medications were adjusted.  OP FU was planned with eye towards DCCV after 3 weeks of uninterrupted anticoagulation if still in AFib.  Bruce Cohen saw Bruce Cohen 10/27/14. Bruce Cohen had missed a few doses of his Eliquis. Bruce Cohen was fairly asymptomatic with his atrial fibrillation and his heart rate remained controlled. We held off on proceeding with cardioversion until we could confirm that Bruce Cohen was adherent with his anticoagulation therapy. Bruce Cohen returns for follow-up.  Bruce Cohen is doing well.  Denies chest pain. Bruce Cohen has mild DOE.  Bruce Cohen is NYHA 2-2b.  Breathing is improving.  No PND, orthopnea, edema.  No syncope.  No bleeding problems.     Studies/Reports Reviewed Today:  LHC 10/01/14 LM:  Normal LAD:   20% proximal 20% mid LCx:   minor irregularities. RCA:  20% proximal   LVEF:  25 %, there is moderate mitral regurgitation .   Echo 10/01/14 - Mild concentric hypertrophy. EF 30% to 35%. There is hypokinesis of the inferior myocardium. - Mitral valve: Calcified annulus. Mildly thickened leaflets. There was mild regurgitation. - Left atrium: The atrium was mildly dilated. - Right atrium: The atrium was mildly dilated. - Pulmonic valve: There was moderate regurgitation.   Past Medical History  Diagnosis Date  . LVH (left ventricular hypertrophy)   . Mitral regurgitation   .  Pericarditis     a. Hospitalized in [place]. Episode in 1998. Had a cath which was clean (also in 1990s). 2 further episodes that were mild.  . Hyperlipidemia   . Hypertension   . Atrial fibrillation with RVR 09/2014    Newly recognized/notes 09/29/2014  . Heart murmur     Past Surgical History  Procedure Laterality Date  . Cardiac catheterization  1998    "clean"  . Tonsillectomy    . Appendectomy    . Back surgery    . Lumbar laminectomy  1970's    "partial"  . Orif finger / thumb fracture Left ~ 2006    "thumb"  . Fracture surgery    . Left heart catheterization with coronary angiogram N/A 10/01/2014    Procedure: LEFT HEART CATHETERIZATION WITH CORONARY ANGIOGRAM;  Surgeon: Wellington Hampshire, MD;  Location: [facility];  Service: Cardiovascular;  Laterality: N/A;     Current Outpatient Prescriptions  Medication Sig Dispense Refill  . amLODipine (NORVASC) 5 MG tablet Take 5 mg by mouth daily.     Marland Kitchen apixaban (ELIQUIS) 5 MG TABS tablet Take 1 tablet (5 mg total) by mouth 2 (two) times daily. 60 tablet 11  . cholecalciferol (VITAMIN D) 1000 UNITS tablet 1,000 Units. 2 TABLETS DAILY    . folic acid (FOLVITE) 1 MG tablet Take 1 tablet (1 mg total) by mouth daily. 30 tablet 2  . furosemide (LASIX) 40 MG  tablet Take 1 tablet (40 mg total) by mouth daily. 30 tablet 2  . losartan (COZAAR) 50 MG tablet Take 1 tablet (50 mg total) by mouth daily. 30 tablet 11  . metoprolol (LOPRESSOR) 50 MG tablet Take 1 tablet (50 mg total) by mouth 2 (two) times daily. 60 tablet 2  . Multiple Vitamins-Minerals (MULTIVITAMIN PO) Take 1 tablet by mouth daily.     Marland Kitchen thiamine 100 MG tablet Take 1 tablet (100 mg total) by mouth daily. 30 tablet 2  . Vitamin D, Ergocalciferol, (DRISDOL) 50000 UNITS CAPS capsule Take 50,000 Units by mouth once a week.  0  . atorvastatin (LIPITOR) 40 MG tablet Take 1 tablet (40 mg total) by mouth daily. 30 tablet 11   No current facility-administered medications for this  visit.    Allergies:   Review of patient's allergies indicates no known allergies.    Social History:  The patient  reports that Bruce Cohen has quit smoking. His smoking use included Cigarettes. Bruce Cohen started smoking about 3 years ago. Bruce Cohen has a 15 pack-year smoking history. Bruce Cohen has never used smokeless tobacco. Bruce Cohen reports that Bruce Cohen drinks alcohol. Bruce Cohen reports that Bruce Cohen does not use illicit drugs.   Family History:  The patient's family history includes Heart disease in his mother; Heart failure in his father; Stroke in his mother. There is no history of CAD or Heart attack.    ROS:   Please see the history of present illness.   Review of Systems  HENT: Positive for hearing loss.   Cardiovascular: Positive for dyspnea on exertion.  All other systems reviewed and are negative.    PHYSICAL EXAM: VS:  BP 145/97 mmHg  Pulse 86  Ht 6' 2.5" (1.892 m)  Wt 207 lb (93.895 kg)  BMI 26.23 kg/m2    Wt Readings from Last 3 Encounters:  11/19/14 207 lb (93.895 kg)  10/27/14 208 lb 6.4 oz (94.53 kg)  10/03/14 201 lb (91.173 kg)     GEN: Well nourished, well developed, in no acute distress HEENT: normal Neck: no JVD, no masses Cardiac:  Normal S1/S2, irreg irreg rhythm; no murmur ,  no rubs or gallops, no edema;    Respiratory:  clear to auscultation bilaterally, no wheezing, rhonchi or rales. GI: soft, nontender, nondistended, + BS MS: no deformity or atrophy Skin: warm and dry  Neuro:  CNs II-XII intact, Strength and sensation are intact Psych: Normal affect   EKG:  EKG is ordered today.  It demonstrates:   AFib, HR 86, no change from prior tracing.   Recent Labs: 09/29/2014: B Natriuretic Peptide 745.9*; Magnesium 1.9; TSH 2.081 09/30/2014: ALT 9 10/02/2014: Hemoglobin 14.1; Platelets 343 11/03/2014: BUN 18; Creatinine 1.23; Potassium 3.6; Sodium 136    Lipid Panel    Component Value Date/Time   CHOL 116 09/30/2014 0423   TRIG 87 09/30/2014 0423   HDL 30* 09/30/2014 0423   CHOLHDL 3.9  09/30/2014 0423   VLDL 17 09/30/2014 0423   LDLCALC 69 09/30/2014 0423      ASSESSMENT AND PLAN:  Chronic systolic CHF (congestive heart failure) Volume stable.  Continue current Rx.  Bruce Cohen is NYHA 2-2b.    NICM (nonischemic cardiomyopathy) Possible tachy mediated.  Rate now well controlled.  Continue current dose of beta-blocker.  Will need to recheck Echo once Bruce Cohen is back in NSR.  If EF remains < 35%, refer to EP for ICD.  PAF (paroxysmal atrial fibrillation) Bruce Cohen has remained on Eliquis without interruption for at least 3  weeks. Bruce Cohen will arrange cardioversion.  Bruce Cohen is having issues with cost of Eliquis.  Will try to get PA filled out.  Will give samples.  If we need to, consider changing to coumadin or Pradaxa 1 month after DCCV.  Bruce Cohen does snore.  Will arrange Sleep Study.  Coronary artery disease No angina.  No ASA as Bruce Cohen is on Eliquis.  Continue statin.  Essential hypertension Borderline control.  Continue to monitor.   Hyperlipidemia Continue statin.  Bruce Cohen is having trouble affording Crestor.  Bruce Cohen will change Bruce Cohen to Lipitor 40 mg QD.  Check Lipids and LFTs in 6 weeks after starting Lipitor.   Tobacco abuse  Bruce Cohen has stopped smoking.   Current medicines are reviewed at length with the patient today.  Any concerns are as outlined above. The following changes have been made:    Stop Crestor  Start Lipitor 40 mg QD   Labs/ tests ordered today include:  Orders Placed This Encounter  Procedures  . Basic Metabolic Panel (BMET)  . CBC w/Diff  . Lipid Profile  . Hepatic function panel  . EKG 12-Lead  . Split night study    Disposition:   FU me or Dr. Casandra Doffing 2 weeks after DCCV.     Signed, Versie Starks, MHS 11/19/2014 5:12 PM    [facility] [place], [place], [place]  [address] Phone: [phone]; Fax: [phone]

## 2014-11-19 NOTE — Patient Instructions (Addendum)
Medication Instructions:  1. FINISH CURRENT BOTTLE OF CRESTOR THEN STOP  2. START LIPITOR 40 MG AFTER YOU FINISH THE CRESTOR  Labwork: 1. TODAY; BMET, CBC W/DIFF  2. YOU WILL NEED FASTING LIPID AND LIVER PANEL TO BE DONE 6-8 WEEKS AFTER STARTING THE LIPITOR  Testing/Procedures: 1. Your physician has recommended that you have a SPLIT NIGHT sleep study. This test records several body functions during sleep, including: brain activity, eye movement, oxygen and carbon dioxide blood levels, heart rate and rhythm, breathing rate and rhythm, the flow of air through your mouth and nose, snoring, body muscle movements, and chest and belly movement.  2. Your physician has recommended that you have a Cardioversion (DCCV) 11/21/14 @ 11 AM SEE INSTRUCTIONS LETTER GIVEN TO YOU TODAY. Electrical Cardioversion uses a jolt of electricity to your heart either through paddles or wired patches attached to your chest. This is a controlled, usually prescheduled, procedure. Defibrillation is done under light anesthesia in the hospital, and you usually go home the day of the procedure. This is done to get your heart back into a normal rhythm. You are not awake for the procedure. Please see the instruction sheet given to you today.  Follow-Up: YOU HAVE A FOLLOW UP WITH Ryan, [facility] 12/10/14 @ 3 PM  Any Other Special Instructions Will Be Listed Below (If Applicable).

## 2014-11-19 NOTE — Telephone Encounter (Signed)
Pt notified of lab results by phone ok for DCCV 5/13. Pt said thank you and verbalized understanding to results.

## 2014-11-21 ENCOUNTER — Ambulatory Visit (HOSPITAL_COMMUNITY): Payer: Medicare Other | Admitting: Anesthesiology

## 2014-11-21 ENCOUNTER — Ambulatory Visit (HOSPITAL_COMMUNITY)
Admission: RE | Admit: 2014-11-21 | Discharge: 2014-11-21 | Disposition: A | Discharge status: Home / Self Care | Payer: Medicare Other | Source: Ambulatory Visit | Attending: Internal Medicine | Admitting: Internal Medicine

## 2014-11-21 ENCOUNTER — Encounter (HOSPITAL_COMMUNITY)
Admission: RE | Disposition: A | Discharge status: Home / Self Care | Payer: Self-pay | Source: Ambulatory Visit | Attending: Internal Medicine

## 2014-11-21 ENCOUNTER — Encounter (HOSPITAL_COMMUNITY): Payer: Self-pay | Admitting: *Deleted

## 2014-11-21 DIAGNOSIS — I10 Essential (primary) hypertension: Secondary | ICD-10-CM | POA: Diagnosis not present

## 2014-11-21 DIAGNOSIS — I480 Paroxysmal atrial fibrillation: Secondary | ICD-10-CM | POA: Insufficient documentation

## 2014-11-21 DIAGNOSIS — Z87891 Personal history of nicotine dependence: Secondary | ICD-10-CM | POA: Diagnosis not present

## 2014-11-21 DIAGNOSIS — I4819 Other persistent atrial fibrillation: Secondary | ICD-10-CM | POA: Insufficient documentation

## 2014-11-21 DIAGNOSIS — I5022 Chronic systolic (congestive) heart failure: Secondary | ICD-10-CM | POA: Insufficient documentation

## 2014-11-21 DIAGNOSIS — I2510 Atherosclerotic heart disease of native coronary artery without angina pectoris: Secondary | ICD-10-CM | POA: Diagnosis not present

## 2014-11-21 DIAGNOSIS — I481 Persistent atrial fibrillation: Secondary | ICD-10-CM

## 2014-11-21 DIAGNOSIS — E785 Hyperlipidemia, unspecified: Secondary | ICD-10-CM | POA: Diagnosis not present

## 2014-11-21 DIAGNOSIS — I4891 Unspecified atrial fibrillation: Secondary | ICD-10-CM | POA: Diagnosis present

## 2014-11-21 HISTORY — PX: CARDIOVERSION: SHX1299

## 2014-11-21 SURGERY — CARDIOVERSION
Anesthesia: General

## 2014-11-21 MED ORDER — SODIUM CHLORIDE 0.9 % IV SOLN
INTRAVENOUS | Status: DC
  Administered 2014-11-21: 500 mL via INTRAVENOUS

## 2014-11-21 MED ORDER — SODIUM CHLORIDE 0.9 % IJ SOLN: 3.0000 mL | Freq: Two times a day (BID) | INTRAMUSCULAR | Status: DC

## 2014-11-21 MED ORDER — PROPOFOL 10 MG/ML IV BOLUS
INTRAVENOUS | Status: DC | PRN
  Administered 2014-11-21: 100 mg via INTRAVENOUS

## 2014-11-21 MED ORDER — SODIUM CHLORIDE 0.9 % IJ SOLN: 3.0000 mL | INTRAMUSCULAR | Status: DC | PRN

## 2014-11-21 MED ORDER — PROPOFOL 10 MG/ML IV BOLUS
INTRAVENOUS | Status: AC
  Filled 2014-11-21: qty 20

## 2014-11-21 MED ORDER — SODIUM CHLORIDE 0.9 % IV SOLN: 250.0000 mL | INTRAVENOUS | Status: DC

## 2014-11-21 MED ORDER — LIDOCAINE HCL (CARDIAC) 20 MG/ML IV SOLN
INTRAVENOUS | Status: DC | PRN
  Administered 2014-11-21: 100 mg via INTRAVENOUS

## 2014-11-21 NOTE — Discharge Instructions (Signed)
Electrical Cardioversion, Care After °Refer to this sheet in the next few weeks. These instructions provide you with information on caring for yourself after your procedure. Your health care provider may also give you more specific instructions. Your treatment has been planned according to current medical practices, but problems sometimes occur. Call your health care provider if you have any problems or questions after your procedure. °WHAT TO EXPECT AFTER THE PROCEDURE °After your procedure, it is typical to have the following sensations: °· Some redness on the skin where the shocks were delivered. If this is tender, a sunburn lotion or hydrocortisone cream may help. °· Possible return of an abnormal heart rhythm within hours or days after the procedure. °HOME CARE INSTRUCTIONS °· Take medicines only as directed by your health care provider. Be sure you understand how and when to take your medicine. °· Learn how to feel your pulse and check it often. °· Limit your activity for 48 hours after the procedure or as directed by your health care provider. °· Avoid or minimize caffeine and other stimulants as directed by your health care provider. °SEEK MEDICAL CARE IF: °· You feel like your heart is beating too fast or your pulse is not regular. °· You have any questions about your medicines. °· You have bleeding that will not stop. °SEEK IMMEDIATE MEDICAL CARE IF: °· You are dizzy or feel faint. °· It is hard to breathe or you feel short of breath. °· There is a change in discomfort in your chest. °· Your speech is slurred or you have trouble moving an arm or leg on one side of your body. °· You get a serious muscle cramp that does not go away. °· Your fingers or toes turn cold or blue. °Document Released: 04/17/2013 Document Revised: 11/11/2013 Document Reviewed: 04/17/2013 °[employer]® Patient Information ©2015 [employer]. This information is not intended to replace advice given to you by your health care provider.  Make sure you discuss any questions you have with your health care provider. ° ° °Conscious Sedation, Adult, Care After °Refer to this sheet in the next few weeks. These instructions provide you with information on caring for yourself after your procedure. Your health care provider may also give you more specific instructions. Your treatment has been planned according to current medical practices, but problems sometimes occur. Call your health care provider if you have any problems or questions after your procedure. °WHAT TO EXPECT AFTER THE PROCEDURE  °After your procedure: °· You may feel sleepy, clumsy, and have poor balance for several hours. °· Vomiting may occur if you eat too soon after the procedure. °HOME CARE INSTRUCTIONS °· Do not participate in any activities where you could become injured for at least 24 hours. Do not: °¨ Drive. °¨ Swim. °¨ Ride a bicycle. °¨ Operate heavy machinery. °¨ Cook. °¨ Use power tools. °¨ Climb ladders. °¨ Work from a high place. °· Do not make important decisions or sign legal documents until you are improved. °· If you vomit, drink water, juice, or soup when you can drink without vomiting. Make sure you have little or no nausea before eating solid foods. °· Only take over-the-counter or prescription medicines for pain, discomfort, or fever as directed by your health care provider. °· Make sure you and your family fully understand everything about the medicines given to you, including what side effects may occur. °· You should not drink alcohol, take sleeping pills, or take medicines that cause drowsiness for at least 24   hours. °· If you smoke, do not smoke without supervision. °· If you are feeling better, you may resume normal activities 24 hours after you were sedated. °· Keep all appointments with your health care provider. °SEEK MEDICAL CARE IF: °· Your skin is pale or bluish in color. °· You continue to feel nauseous or vomit. °· Your pain is getting worse and is not  helped by medicine. °· You have bleeding or swelling. °· You are still sleepy or feeling clumsy after 24 hours. °SEEK IMMEDIATE MEDICAL CARE IF: °· You develop a rash. °· You have difficulty breathing. °· You develop any type of allergic problem. °· You have a fever. °MAKE SURE YOU: °· Understand these instructions. °· Will watch your condition. °· Will get help right away if you are not doing well or get worse. °Document Released: 04/17/2013 Document Reviewed: 04/17/2013 °Exit[employer]® Patient Information ©2015 [employer]. This information is not intended to replace advice given to you by your health care provider. Make sure you discuss any questions you have with your health care provider. ° °

## 2014-11-21 NOTE — Op Note (Signed)
Patient anesthetized by anesthesia with Propofol With pads in AP position, patient cardioverted to SR with 200 J synchronized biphasic energy   Procedure without complication 12 lead EKG pending

## 2014-11-21 NOTE — Anesthesia Postprocedure Evaluation (Signed)
  Anesthesia Post-op Note  Patient: Bruce Cohen  Procedure(s) Performed: Procedure(s): CARDIOVERSION (N/A)  Patient Location: PACU (endo)  Anesthesia Type:MAC  Level of Consciousness: awake, alert  and oriented  Airway and Oxygen Therapy: Patient Spontanous Breathing and Patient connected to nasal cannula oxygen  Post-op Pain: none  Post-op Assessment: Post-op Vital signs reviewed and Patient's Cardiovascular Status Stable  Post-op Vital Signs: Reviewed and stable  Last Vitals:  Filed Vitals:   11/21/14 1030  BP: 152/96  Temp:   Resp: 15    Complications: No apparent anesthesia complications

## 2014-11-21 NOTE — Interval H&P Note (Signed)
History and Physical Interval Note:  11/21/2014 7:54 AM  Bruce Cohen  has presented today for surgery, with the diagnosis of afib  The various methods of treatment have been discussed with the patient and family. After consideration of risks, benefits and other options for treatment, the patient has consented to  Procedure(s): CARDIOVERSION (N/A) as a surgical intervention .  The patient's history has been reviewed, patient examined, no change in status, stable for surgery.  I have reviewed the patient's chart and labs.  Questions were answered to the patient's satisfaction.     Dorris Carnes

## 2014-11-21 NOTE — Anesthesia Preprocedure Evaluation (Addendum)
Anesthesia Evaluation  Patient identified by MRN, date of birth, ID band Patient awake    Reviewed: Allergy & Precautions, NPO status , Patient's Chart, lab work & pertinent test results, reviewed documented beta blocker date and time   History of Anesthesia Complications Negative for: history of anesthetic complications  Airway Mallampati: II  TM Distance: >3 FB Neck ROM: Full    Dental  (+) Teeth Intact, Dental Advisory Given   Pulmonary pneumonia -, COPDformer smoker,    Pulmonary exam normal       Cardiovascular hypertension, Pt. on home beta blockers +CHF + dysrhythmias Atrial Fibrillation Rhythm:Irregular  Left ventricle: The cavity size was normal. There was mild concentric hypertrophy. Systolic function was moderately to severely reduced. The estimated ejection fraction was in the range of 30% to 35%. There is hypokinesis of the inferior myocardium.    Neuro/Psych negative neurological ROS  negative psych ROS   GI/Hepatic negative GI ROS,   Endo/Other  negative endocrine ROS  Renal/GU Renal InsufficiencyRenal disease     Musculoskeletal   Abdominal   Peds  Hematology   Anesthesia Other Findings   Reproductive/Obstetrics                            Anesthesia Physical Anesthesia Plan  ASA: III  Anesthesia Plan: General   Post-op Pain Management:    Induction: Intravenous  Airway Management Planned: Mask  Additional Equipment:   Intra-op Plan:   Post-operative Plan:   Informed Consent: I have reviewed the patients History and Physical, chart, labs and discussed the procedure including the risks, benefits and alternatives for the proposed anesthesia with the patient or authorized representative who has indicated his/her understanding and acceptance.   Dental advisory given  Plan Discussed with: CRNA, Anesthesiologist and Surgeon  Anesthesia Plan Comments:          Anesthesia Quick Evaluation

## 2014-11-21 NOTE — H&P (View-Only) (Signed)
Cardiology Office Note   Date:  11/19/2014   ID:  Bruce Cohen, DOB 08/15/1940, MRN [id number]  PCP:  Henrine Screws, MD  Cardiologist:  Dr. Casandra Doffing     Chief Complaint  Patient presents with  . Atrial Fibrillation  . Labs Only     History of Present Illness: Bruce Cohen is a 74 y.o. male with a hx of recurrent pericarditis, HTN, HL.  Admitted 3/21-3/25 with acute systolic HF in the setting of AFib with RVR.  Troponins were elevated.  Echo demonstrated reduced LVF with EF 30-35%.  LHC demonstrated mild non-obstructive CAD.  NICM was thought to likely be due to tachycardia.  There was also concern for ETOH abuse.  HF medications were adjusted.  OP FU was planned with eye towards DCCV after 3 weeks of uninterrupted anticoagulation if still in AFib.  I saw him 10/27/14. He had missed a few doses of his Eliquis. He was fairly asymptomatic with his atrial fibrillation and his heart rate remained controlled. We held off on proceeding with cardioversion until we could confirm that he was adherent with his anticoagulation therapy. He returns for follow-up.  He is doing well.  Denies chest pain. He has mild DOE.  He is NYHA 2-2b.  Breathing is improving.  No PND, orthopnea, edema.  No syncope.  No bleeding problems.     Studies/Reports Reviewed Today:  LHC 10/01/14 LM:  Normal LAD:   20% proximal 20% mid LCx:   minor irregularities. RCA:  20% proximal   LVEF:  25 %, there is moderate mitral regurgitation .   Echo 10/01/14 - Mild concentric hypertrophy. EF 30% to 35%. There is hypokinesis of the inferior myocardium. - Mitral valve: Calcified annulus. Mildly thickened leaflets. There was mild regurgitation. - Left atrium: The atrium was mildly dilated. - Right atrium: The atrium was mildly dilated. - Pulmonic valve: There was moderate regurgitation.   Past Medical History  Diagnosis Date  . LVH (left ventricular hypertrophy)   . Mitral regurgitation   .  Pericarditis     a. Hospitalized in [place]. Episode in 1998. Had a cath which was clean (also in 1990s). 2 further episodes that were mild.  . Hyperlipidemia   . Hypertension   . Atrial fibrillation with RVR 09/2014    Newly recognized/notes 09/29/2014  . Heart murmur     Past Surgical History  Procedure Laterality Date  . Cardiac catheterization  1998    "clean"  . Tonsillectomy    . Appendectomy    . Back surgery    . Lumbar laminectomy  1970's    "partial"  . Orif finger / thumb fracture Left ~ 2006    "thumb"  . Fracture surgery    . Left heart catheterization with coronary angiogram N/A 10/01/2014    Procedure: LEFT HEART CATHETERIZATION WITH CORONARY ANGIOGRAM;  Surgeon: Wellington Hampshire, MD;  Location: [facility];  Service: Cardiovascular;  Laterality: N/A;     Current Outpatient Prescriptions  Medication Sig Dispense Refill  . amLODipine (NORVASC) 5 MG tablet Take 5 mg by mouth daily.     Marland Kitchen apixaban (ELIQUIS) 5 MG TABS tablet Take 1 tablet (5 mg total) by mouth 2 (two) times daily. 60 tablet 11  . cholecalciferol (VITAMIN D) 1000 UNITS tablet 1,000 Units. 2 TABLETS DAILY    . folic acid (FOLVITE) 1 MG tablet Take 1 tablet (1 mg total) by mouth daily. 30 tablet 2  . furosemide (LASIX) 40 MG  tablet Take 1 tablet (40 mg total) by mouth daily. 30 tablet 2  . losartan (COZAAR) 50 MG tablet Take 1 tablet (50 mg total) by mouth daily. 30 tablet 11  . metoprolol (LOPRESSOR) 50 MG tablet Take 1 tablet (50 mg total) by mouth 2 (two) times daily. 60 tablet 2  . Multiple Vitamins-Minerals (MULTIVITAMIN PO) Take 1 tablet by mouth daily.     Marland Kitchen thiamine 100 MG tablet Take 1 tablet (100 mg total) by mouth daily. 30 tablet 2  . Vitamin D, Ergocalciferol, (DRISDOL) 50000 UNITS CAPS capsule Take 50,000 Units by mouth once a week.  0  . atorvastatin (LIPITOR) 40 MG tablet Take 1 tablet (40 mg total) by mouth daily. 30 tablet 11   No current facility-administered medications for this  visit.    Allergies:   Review of patient's allergies indicates no known allergies.    Social History:  The patient  reports that he has quit smoking. His smoking use included Cigarettes. He started smoking about 3 years ago. He has a 15 pack-year smoking history. He has never used smokeless tobacco. He reports that he drinks alcohol. He reports that he does not use illicit drugs.   Family History:  The patient's family history includes Heart disease in his mother; Heart failure in his father; Stroke in his mother. There is no history of CAD or Heart attack.    ROS:   Please see the history of present illness.   Review of Systems  HENT: Positive for hearing loss.   Cardiovascular: Positive for dyspnea on exertion.  All other systems reviewed and are negative.    PHYSICAL EXAM: VS:  BP 145/97 mmHg  Pulse 86  Ht 6' 2.5" (1.892 m)  Wt 207 lb (93.895 kg)  BMI 26.23 kg/m2    Wt Readings from Last 3 Encounters:  11/19/14 207 lb (93.895 kg)  10/27/14 208 lb 6.4 oz (94.53 kg)  10/03/14 201 lb (91.173 kg)     GEN: Well nourished, well developed, in no acute distress HEENT: normal Neck: no JVD, no masses Cardiac:  Normal S1/S2, irreg irreg rhythm; no murmur ,  no rubs or gallops, no edema;    Respiratory:  clear to auscultation bilaterally, no wheezing, rhonchi or rales. GI: soft, nontender, nondistended, + BS MS: no deformity or atrophy Skin: warm and dry  Neuro:  CNs II-XII intact, Strength and sensation are intact Psych: Normal affect   EKG:  EKG is ordered today.  It demonstrates:   AFib, HR 86, no change from prior tracing.   Recent Labs: 09/29/2014: B Natriuretic Peptide 745.9*; Magnesium 1.9; TSH 2.081 09/30/2014: ALT 9 10/02/2014: Hemoglobin 14.1; Platelets 343 11/03/2014: BUN 18; Creatinine 1.23; Potassium 3.6; Sodium 136    Lipid Panel    Component Value Date/Time   CHOL 116 09/30/2014 0423   TRIG 87 09/30/2014 0423   HDL 30* 09/30/2014 0423   CHOLHDL 3.9  09/30/2014 0423   VLDL 17 09/30/2014 0423   LDLCALC 69 09/30/2014 0423      ASSESSMENT AND PLAN:  Chronic systolic CHF (congestive heart failure) Volume stable.  Continue current Rx.  He is NYHA 2-2b.    NICM (nonischemic cardiomyopathy) Possible tachy mediated.  Rate now well controlled.  Continue current dose of beta-blocker.  Will need to recheck Echo once he is back in NSR.  If EF remains < 35%, refer to EP for ICD.  PAF (paroxysmal atrial fibrillation) He has remained on Eliquis without interruption for at least 3  weeks. I will arrange cardioversion.  He is having issues with cost of Eliquis.  Will try to get PA filled out.  Will give samples.  If we need to, consider changing to coumadin or Pradaxa 1 month after DCCV.  He does snore.  Will arrange Sleep Study.  Coronary artery disease No angina.  No ASA as he is on Eliquis.  Continue statin.  Essential hypertension Borderline control.  Continue to monitor.   Hyperlipidemia Continue statin.  He is having trouble affording Crestor.  I will change him to Lipitor 40 mg QD.  Check Lipids and LFTs in 6 weeks after starting Lipitor.   Tobacco abuse  He has stopped smoking.   Current medicines are reviewed at length with the patient today.  Any concerns are as outlined above. The following changes have been made:    Stop Crestor  Start Lipitor 40 mg QD   Labs/ tests ordered today include:  Orders Placed This Encounter  Procedures  . Basic Metabolic Panel (BMET)  . CBC w/Diff  . Lipid Profile  . Hepatic function panel  . EKG 12-Lead  . Split night study    Disposition:   FU me or Dr. Casandra Doffing 2 weeks after DCCV.     Signed, Versie Starks, MHS 11/19/2014 5:12 PM    [facility] [address], [place], [place]  [address] Phone: [phone]; Fax: [phone]

## 2014-11-21 NOTE — Transfer of Care (Signed)
Immediate Anesthesia Transfer of Care Note  Patient: Bruce Cohen  Procedure(s) Performed: Procedure(s): CARDIOVERSION (N/A)  Patient Location: PACU  Anesthesia Type:MAC  Level of Consciousness: awake, alert  and oriented  Airway & Oxygen Therapy: Patient Spontanous Breathing and Patient connected to nasal cannula oxygen  Post-op Assessment: Report given to RN and Post -op Vital signs reviewed and stable  Post vital signs: Reviewed and stable  Last Vitals:  Filed Vitals:   11/21/14 1030  BP: 152/96  Temp:   Resp: 15    Complications: No apparent anesthesia complications

## 2014-11-24 ENCOUNTER — Encounter (HOSPITAL_COMMUNITY): Payer: Self-pay | Admitting: Internal Medicine

## 2014-12-03 ENCOUNTER — Encounter: Payer: Self-pay | Admitting: Podiatry

## 2014-12-03 ENCOUNTER — Ambulatory Visit (INDEPENDENT_AMBULATORY_CARE_PROVIDER_SITE_OTHER): Payer: Medicare Other | Admitting: Podiatry

## 2014-12-03 VITALS — BP 152/93 | HR 49 | Temp 96.6°F | Resp 14

## 2014-12-03 DIAGNOSIS — B351 Tinea unguium: Secondary | ICD-10-CM | POA: Diagnosis not present

## 2014-12-03 DIAGNOSIS — M79676 Pain in unspecified toe(s): Secondary | ICD-10-CM

## 2014-12-03 NOTE — Progress Notes (Signed)
   Subjective:    Patient ID: Bruce Cohen, male    DOB: [birth year], 74 y.o.   MRN: [id number]  HPI N-painful, sore, discolored L- left great toe, B/L toenails D-1 year O-gradual C-sharp pain when touched A- when putting on shoes  T- tried to trim them himself, but can't now  Patient describes partial knee debridement by his internist, however, the discomfort in toenails persisted. He describes any active treatment for the fungal toenails  Review of Systems  All other systems reviewed and are negative.      Objective:   Physical Exam  Orientated 3  Vascular: Left DP 1/4 Right DP 2/ 4 PT pulses 2/4 bilaterally No peripheral edema noted bilaterally  Neurological: Vibratory sensation diminished bilaterally Ankle reflexes reactive bilaterally Sensation to 10 g monofilament wire intact 5/5 bilaterally  Dermatological: The toenails are yellow, brittle, incurvated, hypertrophic, elongated, and tender direct palpation 6-10 Plantar scaling medially plantarly and laterally  Musculoskeletal: Pes planus bilaterally HAV deformities bilaterally Is no restriction ankle, subtalar, midtarsal joints bilaterally       Assessment & Plan:   Assessment: Satisfactory vascular status Mild peripheral neuropathy Extremity neglected symptomatic onychomycoses 6-10  Plan: Review the results of examination with patient today. We discussed treatment options for mycotic toenails including no treatment, repetitive debridement, and possibility of oral medication. The day patient requested nail debridement and would like to consider the possibility of oral medication.  The nails 10 are debrided without any debridement.  Nail fragments submitted to the lab for PAS stain and fungal culture

## 2014-12-03 NOTE — Patient Instructions (Signed)
We have submitted nail fragments for evaluation for possible oral medication in treatment of fungal toenails The results could take 30+ days Office will contact you when the results are available

## 2014-12-10 ENCOUNTER — Ambulatory Visit (INDEPENDENT_AMBULATORY_CARE_PROVIDER_SITE_OTHER): Payer: Medicare Other | Admitting: Physician Assistant

## 2014-12-10 ENCOUNTER — Encounter: Payer: Self-pay | Admitting: Physician Assistant

## 2014-12-10 VITALS — BP 138/85 | HR 65 | Ht 74.5 in | Wt 204.0 lb

## 2014-12-10 DIAGNOSIS — I5022 Chronic systolic (congestive) heart failure: Secondary | ICD-10-CM | POA: Diagnosis not present

## 2014-12-10 DIAGNOSIS — I480 Paroxysmal atrial fibrillation: Secondary | ICD-10-CM

## 2014-12-10 DIAGNOSIS — E785 Hyperlipidemia, unspecified: Secondary | ICD-10-CM

## 2014-12-10 DIAGNOSIS — Z720 Tobacco use: Secondary | ICD-10-CM

## 2014-12-10 DIAGNOSIS — I10 Essential (primary) hypertension: Secondary | ICD-10-CM

## 2014-12-10 DIAGNOSIS — I428 Other cardiomyopathies: Secondary | ICD-10-CM

## 2014-12-10 DIAGNOSIS — I429 Cardiomyopathy, unspecified: Secondary | ICD-10-CM

## 2014-12-10 DIAGNOSIS — R0683 Snoring: Secondary | ICD-10-CM

## 2014-12-10 LAB — BASIC METABOLIC PANEL
BUN: 17 mg/dL (ref 6–23)
CHLORIDE: 100 meq/L (ref 96–112)
CO2: 31 mEq/L (ref 19–32)
CREATININE: 1.28 mg/dL (ref 0.40–1.50)
Calcium: 9.6 mg/dL (ref 8.4–10.5)
GFR: 58.35 mL/min — AB (ref >60.00)
Glucose, Bld: 96 mg/dL (ref 70–99)
POTASSIUM: 3.9 meq/L (ref 3.5–5.1)
SODIUM: 137 meq/L (ref 135–145)

## 2014-12-10 NOTE — Progress Notes (Signed)
Cardiology Office Note   Date:  12/10/2014   ID:  Bruce Cohen, DOB [birth year], MRN [id number]  PCP:  Bruce Screws, MD  Cardiologist:  Dr. Casandra Doffing     Chief Complaint  Patient presents with  . Atrial Fibrillation     History of Present Illness: Bruce Cohen is a 74 y.o. male with a hx of recurrent pericarditis, HTN, HL.  Admitted 09/2014 with acute systolic HF in the setting of AFib with RVR.  Troponins were elevated.  Echo demonstrated reduced LVF with EF 30-35%.  LHC demonstrated mild non-obstructive CAD.  NICM was thought to likely be due to tachycardia.  There was also concern for ETOH abuse.  HF medications were adjusted.  OP FU was planned with eye towards DCCV after 3 weeks of uninterrupted anticoagulation if still in AFib.  I saw him 11/19/14.  He had been adherent with anticoagulation Rx with Eliquis for at least 3 weeks.  I set him up for DCCV. He underwent DCCV 5/13 with restoration of NSR.  He returns for FU.  He feels much better.  He denies chest pain or significant DOE.  Denies orthopnea, PND, edema. No syncope or dizziness.     Studies/Reports Reviewed Today:  LHC 10/01/14 LM:  Normal LAD:   20% proximal 20% mid LCx:   minor irregularities. RCA:  20% proximal   LVEF:  25 %, there is moderate mitral regurgitation .   Echo 10/01/14 - Mild concentric hypertrophy. EF 30% to 35%. There is hypokinesis of the inferior myocardium. - Mitral valve: Calcified annulus. Mildly thickened leaflets. There was mild regurgitation. - Left atrium: The atrium was mildly dilated. - Right atrium: The atrium was mildly dilated. - Pulmonic valve: There was moderate regurgitation.   Past Medical History  Diagnosis Date  . LVH (left ventricular hypertrophy)   . Mitral regurgitation   . Pericarditis     a. Hospitalized in [place]. Episode in 1998. Had a cath which was clean (also in 1990s). 2 further episodes that were mild.  . Hyperlipidemia   . Hypertension   .  Atrial fibrillation with RVR 09/2014    Newly recognized/notes 09/29/2014  . Heart murmur     Past Surgical History  Procedure Laterality Date  . Cardiac catheterization  1998    "clean"  . Tonsillectomy    . Appendectomy    . Back surgery    . Lumbar laminectomy  1970's    "partial"  . Orif finger / thumb fracture Left ~ 2006    "thumb"  . Fracture surgery    . Left heart catheterization with coronary angiogram N/A 10/01/2014    Procedure: LEFT HEART CATHETERIZATION WITH CORONARY ANGIOGRAM;  Surgeon: Wellington Hampshire, MD;  Location: [facility];  Service: Cardiovascular;  Laterality: N/A;  . Cardioversion N/A 11/21/2014    Procedure: CARDIOVERSION;  Surgeon: Fay Records, MD;  Location: [facility]COPY;  Service: Cardiovascular;  Laterality: N/A;     Current Outpatient Prescriptions  Medication Sig Dispense Refill  . amLODipine (NORVASC) 5 MG tablet Take 5 mg by mouth daily.     Marland Kitchen apixaban (ELIQUIS) 5 MG TABS tablet Take 1 tablet (5 mg total) by mouth 2 (two) times daily. 60 tablet 11  . atorvastatin (LIPITOR) 40 MG tablet Take 1 tablet (40 mg total) by mouth daily. 30 tablet 11  . CRESTOR 20 MG tablet Take 20 mg by mouth daily.  1  . folic acid (FOLVITE) 1 MG tablet Take  1 tablet (1 mg total) by mouth daily. 30 tablet 2  . furosemide (LASIX) 40 MG tablet Take 1 tablet (40 mg total) by mouth daily. 30 tablet 2  . losartan (COZAAR) 50 MG tablet Take 1 tablet (50 mg total) by mouth daily. 30 tablet 11  . metoprolol (LOPRESSOR) 50 MG tablet Take 1 tablet (50 mg total) by mouth 2 (two) times daily. 60 tablet 2  . Multiple Vitamins-Minerals (MULTIVITAMIN PO) Take 1 tablet by mouth daily.     Marland Kitchen thiamine 100 MG tablet Take 1 tablet (100 mg total) by mouth daily. 30 tablet 2  . Vitamin D, Ergocalciferol, (DRISDOL) 50000 UNITS CAPS capsule Take 50,000 Units by mouth once a week.  0   No current facility-administered medications for this visit.    Allergies:   Review of patient's allergies  indicates no known allergies.    Social History:  The patient  reports that he has quit smoking. His smoking use included Cigarettes. He started smoking about 3 years ago. He has a 15 pack-year smoking history. He has never used smokeless tobacco. He reports that he drinks alcohol. He reports that he does not use illicit drugs.   Family History:  The patient's family history includes Heart disease in his mother; Heart failure in his father; Stroke in his mother. There is no history of CAD or Heart attack.    ROS:   Please see the history of present illness.   Review of Systems  All other systems reviewed and are negative.    PHYSICAL EXAM: VS:  BP 138/85 mmHg  Pulse 65  Ht 6' 2.5" (1.892 m)  Wt 204 lb (92.534 kg)  BMI 25.85 kg/m2    Wt Readings from Last 3 Encounters:  12/10/14 204 lb (92.534 kg)  11/19/14 207 lb (93.895 kg)  10/27/14 208 lb 6.4 oz (94.53 kg)     GEN: Well nourished, well developed, in no acute distress HEENT: normal Neck: no JVD, no masses Cardiac:  Normal S1/S2, RRR,  no murmur ,  no rubs or gallops, no edema;    Respiratory:  clear to auscultation bilaterally, no wheezing, rhonchi or rales. GI: soft, nontender, nondistended, + BS MS: no deformity or atrophy Skin: warm and dry  Neuro:  CNs II-XII intact, Strength and sensation are intact Psych: Normal affect   EKG:  EKG is ordered today.  It demonstrates:   NSR, HR 65, rightward axis, NSSTTW changes.   Recent Labs: 09/29/2014: B Natriuretic Peptide 745.9*; Magnesium 1.9; TSH 2.081 09/30/2014: ALT 9 11/19/2014: BUN 17; Creatinine 1.28; Hemoglobin 15.3; Platelets 319.0; Potassium 4.1; Sodium 139    Lipid Panel    Component Value Date/Time   CHOL 116 09/30/2014 0423   TRIG 87 09/30/2014 0423   HDL 30* 09/30/2014 0423   CHOLHDL 3.9 09/30/2014 0423   VLDL 17 09/30/2014 0423   LDLCALC 69 09/30/2014 0423      ASSESSMENT AND PLAN:  Chronic systolic CHF (congestive heart failure):  Volume stable.   Continue current Rx.  He is symptomatically improved with restoration of NSR.  He is NYHA 2.  Check BMET today.     NICM (nonischemic cardiomyopathy):  Possible tachy mediated.  I will recheck Echo now that he is back in NSR.  If EF remains < 35%, refer to EP for ICD.  Continue beta-blocker, angiotensin receptor blocker.  PAF (paroxysmal atrial fibrillation):  Now remains in NSR.  He feels much better.  If he has recurrent AFib, will need to  start AAD therapy (Amiodarone vs Tikosyn) to maintain rhythm control.  Continue Eliquis.  If this becomes too expensive, change to Pradaxa vs Coumadin.   Coronary artery disease:   No angina.  No ASA as he is on Eliquis.  Continue statin.  Essential hypertension:  Controlled.   Hyperlipidemia:  Continue statin. Check Lipids and LFTs 01/07/15.  Snoring:  Sleep test pending.  Tobacco abuse:  He has stopped smoking.   Current medicines are reviewed at length with the patient today.  Any concerns are as outlined above. The following changes have been made:    None    Labs/ tests ordered today include:  Orders Placed This Encounter  Procedures  . Basic Metabolic Panel (BMET)  . EKG 12-Lead  . Echocardiogram    Disposition:   FU Dr. Casandra Doffing 3 mos     Signed, Versie Starks, MHS 12/10/2014 4:42 PM    [facility] [place], [place], [place]  [address] Phone: [phone]; Fax: [phone]

## 2014-12-10 NOTE — Patient Instructions (Signed)
Medication Instructions:  Your physician recommends that you continue on your current medications as directed. Please refer to the Current Medication list given to you today.   Labwork: TODAY BMET  Testing/Procedures: Your physician has requested that you have an echocardiogram. Echocardiography is a painless test that uses sound waves to create images of your heart. It provides your doctor with information about the size and shape of your heart and how well your heart's chambers and valves are working. This procedure takes approximately one hour. There are no restrictions for this procedure.  Follow-Up: YOU WILL NEED TO FOLLOW UP WITH DR. VARANASI IN 3 MONTHS  Any Other Special Instructions Will Be Listed Below (If Applicable).

## 2014-12-11 ENCOUNTER — Telehealth: Payer: Self-pay | Admitting: *Deleted

## 2014-12-11 NOTE — Telephone Encounter (Signed)
Pt notified of lab results by phone with verbal understanding.  

## 2014-12-18 ENCOUNTER — Ambulatory Visit (HOSPITAL_COMMUNITY): Payer: Medicare Other | Attending: Internal Medicine

## 2014-12-18 DIAGNOSIS — I10 Essential (primary) hypertension: Secondary | ICD-10-CM

## 2014-12-18 DIAGNOSIS — I517 Cardiomegaly: Secondary | ICD-10-CM | POA: Insufficient documentation

## 2014-12-18 DIAGNOSIS — I429 Cardiomyopathy, unspecified: Secondary | ICD-10-CM | POA: Diagnosis not present

## 2014-12-18 DIAGNOSIS — I428 Other cardiomyopathies: Secondary | ICD-10-CM

## 2014-12-18 DIAGNOSIS — I340 Nonrheumatic mitral (valve) insufficiency: Secondary | ICD-10-CM | POA: Diagnosis not present

## 2014-12-28 ENCOUNTER — Encounter: Payer: Self-pay | Admitting: Physician Assistant

## 2014-12-31 ENCOUNTER — Telehealth: Payer: Self-pay | Admitting: *Deleted

## 2014-12-31 NOTE — Telephone Encounter (Signed)
Pt notified of echo results with verbal understanding by phone

## 2015-01-01 ENCOUNTER — Telehealth: Payer: Self-pay | Admitting: *Deleted

## 2015-01-01 NOTE — Telephone Encounter (Signed)
Dr. Amalia Hailey reviewed fungal culture 12/03/2014 states positive for fungus and pt needs to schedule an appt to discuss treatment.

## 2015-01-07 ENCOUNTER — Encounter: Payer: Self-pay | Admitting: Podiatry

## 2015-01-07 ENCOUNTER — Other Ambulatory Visit: Payer: Medicare Other

## 2015-01-07 ENCOUNTER — Ambulatory Visit (INDEPENDENT_AMBULATORY_CARE_PROVIDER_SITE_OTHER): Payer: Medicare Other | Admitting: Podiatry

## 2015-01-07 ENCOUNTER — Other Ambulatory Visit: Payer: Self-pay | Admitting: *Deleted

## 2015-01-07 VITALS — BP 138/73 | HR 54 | Resp 14

## 2015-01-07 DIAGNOSIS — B351 Tinea unguium: Secondary | ICD-10-CM

## 2015-01-07 DIAGNOSIS — Z79899 Other long term (current) drug therapy: Secondary | ICD-10-CM | POA: Diagnosis not present

## 2015-01-07 MED ORDER — FUROSEMIDE 40 MG PO TABS: 40.0000 mg | ORAL_TABLET | Freq: Every day | ORAL | Status: DC

## 2015-01-07 MED ORDER — TERBINAFINE HCL 250 MG PO TABS: 250.0000 mg | ORAL_TABLET | Freq: Every day | ORAL | Status: DC

## 2015-01-07 NOTE — Patient Instructions (Signed)
Have your liver profile done and will contact you with results and if within normal limits began Terbinafine 250 mg #30 one daily refill 2 Reappoint 5 weeks

## 2015-01-08 ENCOUNTER — Encounter: Payer: Self-pay | Admitting: Podiatry

## 2015-01-08 NOTE — Progress Notes (Signed)
Patient ID: Bruce Cohen, male   DOB: [birth year], 75 y.o.   MRN: [id number]  Subjective: This patient presents to discuss treatment options for positive PAS stain and fungal culture received date 12/17/2014  Objective: Report of pathology from [facility] outside lab outside lab  B- [id number]  PAS stain moderate fungal growth Fungal culture T rubrum detected  Assessment: Lab consistent with mycotic toenails  Plan: Discussed treatment options with patient including no treatment, topical treatment, oral treatment. I made patient aware that oral treatment had the highest success rate, however, could not assure the results. Patient understands that there are potential adverse reactions associated with oral medications willing to accept risk  Issued request for baseline hepatic function. Contact patient with results of lab and if within normal limits will begin  Terbinafine 250 mg #30 sig one by mouth daily, 2 refills  Reappoint 5 weeks

## 2015-01-09 ENCOUNTER — Telehealth: Payer: Self-pay | Admitting: Physician Assistant

## 2015-01-09 NOTE — Telephone Encounter (Signed)
Per pt    Pt would like a call back about his lab to be done.   Pt does not want to do labs twice.  Test liver.

## 2015-01-13 ENCOUNTER — Other Ambulatory Visit: Payer: Self-pay | Admitting: *Deleted

## 2015-01-13 MED ORDER — METOPROLOL TARTRATE 50 MG PO TABS: 50.0000 mg | ORAL_TABLET | Freq: Two times a day (BID) | ORAL | Status: DC

## 2015-01-13 NOTE — Telephone Encounter (Signed)
I s/w pt today about his question for lab work. he states that podiatry ordered for LFT to be done and he did not want to be stuck twice if at all possible; I advised pt of his lab work 7/6 for LFT/FLP in our office so this will cover what podiatry wants

## 2015-01-14 ENCOUNTER — Other Ambulatory Visit (INDEPENDENT_AMBULATORY_CARE_PROVIDER_SITE_OTHER): Payer: Medicare Other | Admitting: *Deleted

## 2015-01-14 ENCOUNTER — Telehealth: Payer: Self-pay | Admitting: *Deleted

## 2015-01-14 DIAGNOSIS — E785 Hyperlipidemia, unspecified: Secondary | ICD-10-CM | POA: Diagnosis not present

## 2015-01-14 LAB — HEPATIC FUNCTION PANEL
ALT: 17 U/L (ref 0–53)
AST: 25 U/L (ref 0–37)
Albumin: 4.2 g/dL (ref 3.5–5.2)
Alkaline Phosphatase: 101 U/L (ref 39–117)
BILIRUBIN TOTAL: 0.9 mg/dL (ref 0.2–1.2)
Bilirubin, Direct: 0.2 mg/dL (ref 0.0–0.3)
TOTAL PROTEIN: 8.1 g/dL (ref 6.0–8.3)

## 2015-01-14 LAB — LIPID PANEL
CHOL/HDL RATIO: 3
CHOLESTEROL: 122 mg/dL (ref 0–200)
HDL: 43.9 mg/dL (ref >39.00)
LDL Cholesterol: 60 mg/dL (ref 0–99)
NonHDL: 78.1
TRIGLYCERIDES: 92 mg/dL (ref 0.0–149.0)
VLDL: 18.4 mg/dL (ref 0.0–40.0)

## 2015-01-14 NOTE — Telephone Encounter (Signed)
Pt notified of lab results with verbal understanding by phone. 

## 2015-01-15 ENCOUNTER — Telehealth: Payer: Self-pay | Admitting: *Deleted

## 2015-01-15 NOTE — Telephone Encounter (Signed)
Pt called for bloodwork results.  I informed pt Dr. Amalia Hailey states his Hepatic function was normal, and pt could begin the Lamisil.  Orders to pt.

## 2015-01-27 ENCOUNTER — Ambulatory Visit (HOSPITAL_BASED_OUTPATIENT_CLINIC_OR_DEPARTMENT_OTHER): Payer: Medicare Other | Attending: Physician Assistant | Admitting: Radiology

## 2015-01-27 VITALS — Ht 74.0 in | Wt 208.0 lb

## 2015-01-27 DIAGNOSIS — G4733 Obstructive sleep apnea (adult) (pediatric): Secondary | ICD-10-CM | POA: Diagnosis present

## 2015-01-27 DIAGNOSIS — I493 Ventricular premature depolarization: Secondary | ICD-10-CM | POA: Diagnosis not present

## 2015-01-27 DIAGNOSIS — R0683 Snoring: Secondary | ICD-10-CM | POA: Insufficient documentation

## 2015-01-27 DIAGNOSIS — I4819 Other persistent atrial fibrillation: Secondary | ICD-10-CM

## 2015-02-10 ENCOUNTER — Telehealth: Payer: Self-pay | Admitting: *Deleted

## 2015-02-10 NOTE — Telephone Encounter (Signed)
Pt states he has an appt tomorrow to discuss a long-term medication use and would like to have his toenails trimmed.  I spoke with Dr. Phoebe Perch assistant and she said it should be okay to have the toenails trimmed as well.

## 2015-02-11 ENCOUNTER — Ambulatory Visit (INDEPENDENT_AMBULATORY_CARE_PROVIDER_SITE_OTHER): Payer: Medicare Other | Admitting: Podiatry

## 2015-02-11 ENCOUNTER — Encounter: Payer: Self-pay | Admitting: Podiatry

## 2015-02-11 VITALS — BP 142/86 | HR 69 | Resp 12

## 2015-02-11 DIAGNOSIS — Z79899 Other long term (current) drug therapy: Secondary | ICD-10-CM | POA: Diagnosis not present

## 2015-02-11 NOTE — Patient Instructions (Signed)
After you complete 15 doses out of 30 doses into bottle #2 go to the lab and have your liver function study done Our office will contact you with the results of the examination. Unless there is a contraindication he will complete bottle #2 and bottle #3, each have 30 doses per bottle  Return as needed for debridement of mycotic toenails

## 2015-02-11 NOTE — Progress Notes (Signed)
Patient ID: Bruce Cohen, male   DOB: 1940-10-27, 74 y.o.   MRN: [id number]  Subjective: This patient presents today for for tolerance of Lamisil 250 mg which was prescribed on the visit of 01/07/2015. At this time patient has completed of almost 30 doses of Lamisil and denies any complaints including taste disturbance, headaches, rashes or any other bodily complaints  Objective: The toenails 6-10 remaining hypertrophic, elongated, discolored and brittle  ROS: Toenail pain All other systems are negative  Assessment: Initial satisfactory tolerance of Lamisil 250 mg  Plan: At this time I advised patient that his initial tolerance Lamisil seem to be adequate. He is instructed to continue on Lamisil 250 mg 1 daily. After 15 doses into bottle #2 of 30 he was instructed to present to lab for repeat hepatic function and requests for this lab was provided to the patient today. Our office will contact him about the results, less is a contraindication he'll complete a total of 90 doses of terbinafine 250 mg.  I advised patient return as needed for debridement of mycotic toenails

## 2015-02-11 NOTE — Progress Notes (Signed)
   Subjective:    Patient ID: Bruce Cohen, male    DOB: [birth year], 74 y.o.   MRN: [id number]  HPI    Review of Systems  Skin: Positive for color change.       Objective:   Physical Exam        Assessment & Plan:

## 2015-02-12 ENCOUNTER — Encounter (HOSPITAL_BASED_OUTPATIENT_CLINIC_OR_DEPARTMENT_OTHER): Payer: Self-pay | Admitting: Radiology

## 2015-02-12 ENCOUNTER — Telehealth: Payer: Self-pay | Admitting: Cardiology

## 2015-02-12 DIAGNOSIS — G4733 Obstructive sleep apnea (adult) (pediatric): Secondary | ICD-10-CM

## 2015-02-12 HISTORY — DX: Obstructive sleep apnea (adult) (pediatric): G47.33

## 2015-02-12 NOTE — Telephone Encounter (Signed)
Please let patient know that they have sleep apnea and recommend CPAP titration. Please set up titration in the sleep lab. 

## 2015-02-12 NOTE — Sleep Study (Signed)
SLEEP STUDY REPORT    Patient Name: Bruce Cohen, Hammers Date: 01/27/2015 MRN: [id number] Gender: Male D.O.B: Dec 02, 1940 Age (years): 58 Referring Provider: Richardson Dopp Interpreting Physician: Fransico Him MD, ABS RPSGT: Carolin Coy  Height (inches): 74 Weight (lbs): 208 BMI: 27 Neck Size: 17.00  CLINICAL INFORMATION Sleep Study Type: NPSG  Indication for sleep study: Hypertension, OSA, Snoring, Witnessed Apneas  Epworth Sleepiness Score: 6  SLEEP STUDY TECHNIQUE As per the AASM Manual for the Scoring of Sleep and Associated Events v2.3 (April 2016) with a hypopnea requiring 4% desaturations.  The channels recorded and monitored were frontal, central and occipital EEG, electrooculogram (EOG), submentalis EMG (chin), nasal and oral airflow, thoracic and abdominal wall motion, anterior tibialis EMG, snore microphone, electrocardiogram, and pulse oximetry.  MEDICATIONS Patient's medications include: Amlodipine, Eliquis, Lipitor, Lasix, Cozaar, Lopressor, Lamisil, Thiamine, Vit D. Medications self-administered by patient during sleep study : No sleep medicine administered.  SLEEP ARCHITECTURE The study was initiated at 10:29:47 PM and ended at 5:29:10 AM.  Sleep onset time was prolonged at 36.9 minutes and the sleep efficiency was reduced at 49.6%. The total sleep time was 208.0 minutes.  There was no REM sleep.  The patient spent 21.88% of the night in stage N1 sleep, 78.13% in stage N2 sleep, 0.00% in stage N3 and 0.00% in REM.  Alpha intrusion was absent.  Supine sleep was 17.07%.  RESPIRATORY PARAMETERS The overall apnea/hypopnea index (AHI) was 14.7 per hour. There were 0 total apneas, including 0 obstructive, 0 central and 0 mixed apneas. There were 51 hypopneas and 30 RERAs.  AHI while supine was 50.7 per hour.  The mean oxygen saturation was 93.49%. The minimum SpO2 during sleep was 84.00%.  Moderate snoring was noted during this study.  CARDIAC  DATA The 2 lead EKG demonstrated sinus rhythm. The mean heart rate was 50.25 beats per minute. Other EKG findings include: PVCs.  LEG MOVEMENT DATA The total PLMS were 365 with a resulting PLMS index of 105.29. Associated arousal with leg movement index was 9.2 .  IMPRESSIONS Mild to moderate obstructive sleep apnea occurred during this study (AHI = 14.7/h).   No significant central sleep apnea occurred during this study (CAI = 0.0/h). Mild oxygen desaturation was noted during this study (Min O2 = 84.00%). The patient snored with Moderate snoring volume. EKG findings include PVCs. Severe periodic limb movements of sleep occurred during the study. Associated arousals were significant.  DIAGNOSIS Obstructive Sleep Apnea (327.23 [G47.33 ICD-10])  RECOMMENDATIONS Therapeutic CPAP titration to determine optimal pressure required to alleviate sleep disordered breathing. Positional therapy avoiding supine position during sleep. Avoid alcohol, sedatives and other CNS depressants that may worsen sleep apnea and disrupt normal sleep architecture. Sleep hygiene should be reviewed to assess factors that may improve sleep quality. Weight management and regular exercise should be initiated or continued if appropriate.  Signed: Fransico Him, MD ABSM Diplomat, American Board of Sleep Medicine 02/12/2015

## 2015-02-13 ENCOUNTER — Encounter: Payer: Self-pay | Admitting: *Deleted

## 2015-02-13 NOTE — Addendum Note (Signed)
Addended by: Andres Ege on: 02/13/2015 08:43 AM   Modules accepted: Orders

## 2015-02-13 NOTE — Telephone Encounter (Signed)
Patient informed of results.  Stated verbal understanding.  Scheduling a Titration and sending patient a letter with details.

## 2015-02-23 ENCOUNTER — Telehealth: Payer: Self-pay

## 2015-02-23 NOTE — Telephone Encounter (Signed)
Prior auth for Eliquis 5 mg sent to [facility] via Cover My Meds.

## 2015-03-11 ENCOUNTER — Telehealth: Payer: Self-pay

## 2015-03-11 NOTE — Telephone Encounter (Signed)
Eliquis 5 mg approved through [employer]. [id number]. Good through 02/23/2016.

## 2015-03-17 ENCOUNTER — Telehealth: Payer: Self-pay | Admitting: Interventional Cardiology

## 2015-03-17 NOTE — Telephone Encounter (Signed)
Spoke with pt and he states that Eliquis is going to cost him $220/mo. Prior Authorization was completed and sent to [facility] in August. Pt states that he is picking medication up from [facility] on [address] in [place]. Spoke with Jenean Lindau, RN and she said we could possibly try a Tier Exception. Pt would like a call with any information once we have any on whether cost will be the same or if we were able to get it decreased. Pt states that he plans to pick the medication up tomorrow as he is almost out. Will forward to [facility] for review and follow up.

## 2015-03-17 NOTE — Telephone Encounter (Signed)
NEw Message  Pt calling to speak w/ RN about medications- which he should continue or not. Pt has appt set for 10/27 w/ Dr Clayton Bibles. Please call back and discuss.

## 2015-03-17 NOTE — Telephone Encounter (Signed)
Tier exception for Eliquis 5mg  sent to [facility].

## 2015-03-18 NOTE — Telephone Encounter (Signed)
Tier exception already denied by [facility]. Patient informed. He has an appointment with Dr. Irish Lack in October. Will try to keep him supplied with samples till then.

## 2015-04-02 ENCOUNTER — Other Ambulatory Visit: Payer: Self-pay | Admitting: *Deleted

## 2015-04-02 MED ORDER — METOPROLOL TARTRATE 50 MG PO TABS: 50.0000 mg | ORAL_TABLET | Freq: Two times a day (BID) | ORAL | Status: DC

## 2015-04-07 ENCOUNTER — Encounter (HOSPITAL_BASED_OUTPATIENT_CLINIC_OR_DEPARTMENT_OTHER): Payer: Medicare Other

## 2015-04-17 ENCOUNTER — Other Ambulatory Visit: Payer: Self-pay

## 2015-04-17 MED ORDER — FUROSEMIDE 40 MG PO TABS: 40.0000 mg | ORAL_TABLET | Freq: Every day | ORAL | Status: DC

## 2015-04-17 NOTE — Telephone Encounter (Signed)
Liliane Shi, PA-C at 12/10/2014 1:47 PM  furosemide (LASIX) 40 MG tabletTake 1 tablet (40 mg total) by mouth daily Patient Instructions     Medication Instructions:  Your physician recommends that you continue on your current medications as directed. Please refer to the Current Medication list given to you today.

## 2015-05-07 ENCOUNTER — Ambulatory Visit: Payer: Medicare Other | Admitting: Interventional Cardiology

## 2015-05-20 ENCOUNTER — Ambulatory Visit: Payer: Medicare Other | Admitting: Physician Assistant

## 2015-05-26 ENCOUNTER — Encounter: Payer: Self-pay | Admitting: *Deleted

## 2015-05-29 ENCOUNTER — Ambulatory Visit: Payer: Medicare Other | Admitting: Physician Assistant

## 2015-06-03 ENCOUNTER — Encounter (HOSPITAL_BASED_OUTPATIENT_CLINIC_OR_DEPARTMENT_OTHER): Payer: Medicare Other

## 2015-06-14 NOTE — Progress Notes (Signed)
Cardiology Office Note   Date:  06/15/2015   ID:  RULON FREITAG, DOB [birth year], MRN [id number]  PCP:  Henrine Screws, MD  Cardiologist:  Dr. Casandra Doffing     Chief Complaint  Patient presents with  . Follow-up  . Congestive Heart Failure  . Atrial Fibrillation     History of Present Illness: Bruce Cohen is a 74 y.o. male with a hx of recurrent pericarditis, HTN, HL.  Admitted 09/2014 with acute systolic HF in the setting of AFib with RVR.  Troponins were elevated.  Echo demonstrated reduced LVF with EF 30-35%.  LHC demonstrated mild non-obstructive CAD.  NICM was thought to likely be due to tachycardia.  There was also concern for ETOH abuse.  After an appropriate interval of uninterrupted anticoagulation, he underwent DCCV 5/13 with restoration of NSR.  Repeat echocardiogram 12/18/14 demonstrated improved function with an EF of 50%. He returns for follow-up.  Overall, he is doing well.  The patient denies chest pain, shortness of breath, syncope, orthopnea, PND or significant pedal edema.  Denies dizziness, near syncope.     Studies/Reports Reviewed Today:  Echo 12/18/14 EF 50% with mild apical hypokinesis, mild LVH, grade 2 diastolic dysfunction, mild MR, severe BAE, PASP 43 mmHg  LHC 10/01/14 LM:  Normal LAD:   20% proximal 20% mid LCx:   minor irregularities. RCA:  20% proximal   LVEF:  25 %, there is moderate mitral regurgitation .   Echo 10/01/14 Mild LVH, EF 30-35%, inferior HK, MAC, mild MR, mild LAE, mild RAE, moderate PI   Past Medical History  Diagnosis Date  . LVH (left ventricular hypertrophy)   . Mitral regurgitation   . Pericarditis     a. Hospitalized in [place]. Episode in 1998. Had a cath which was clean (also in 1990s). 2 further episodes that were mild.  . Hyperlipidemia   . Hypertension   . Atrial fibrillation with RVR ([place]) 09/2014    Newly recognized/notes 09/29/2014  . Heart murmur   . History of echocardiogram     a. Echo 6/16:  EF  50% with mild apical HK, mild LVH, Gr 2 DD, mild MR, severe BAE, PASP 43 mmHg  . OSA (obstructive sleep apnea) 02/12/2015    Mild to moderate with AHI 14.7/hr    Past Surgical History  Procedure Laterality Date  . Cardiac catheterization  1998    "clean"  . Tonsillectomy    . Appendectomy    . Back surgery    . Lumbar laminectomy  1970's    "partial"  . Orif finger / thumb fracture Left ~ 2006    "thumb"  . Fracture surgery    . Left heart catheterization with coronary angiogram N/A 10/01/2014    Procedure: LEFT HEART CATHETERIZATION WITH CORONARY ANGIOGRAM;  Surgeon: Wellington Hampshire, MD;  Location: [facility] CATH LAB;  Service: Cardiovascular;  Laterality: N/A;  . Cardioversion N/A 11/21/2014    Procedure: CARDIOVERSION;  Surgeon: Fay Records, MD;  Location: [facility] ENDOSCOPY;  Service: Cardiovascular;  Laterality: N/A;     Current Outpatient Prescriptions  Medication Sig Dispense Refill  . amLODipine (NORVASC) 5 MG tablet Take 5 mg by mouth daily.     Marland Kitchen apixaban (ELIQUIS) 5 MG TABS tablet Take 1 tablet (5 mg total) by mouth 2 (two) times daily. 60 tablet 11  . atorvastatin (LIPITOR) 40 MG tablet Take 1 tablet (40 mg total) by mouth daily. 30 tablet 11  . folic acid (FOLVITE) 1 MG  tablet Take 1 tablet (1 mg total) by mouth daily. 30 tablet 2  . furosemide (LASIX) 40 MG tablet Take 1 tablet (40 mg total) by mouth daily. 30 tablet 0  . losartan (COZAAR) 50 MG tablet Take 1 tablet (50 mg total) by mouth daily. 30 tablet 11  . metoprolol (LOPRESSOR) 25 MG tablet Take 1 tablet (25 mg total) by mouth 2 (two) times daily. 60 tablet 11  . Multiple Vitamins-Minerals (MULTIVITAMIN PO) Take 1 tablet by mouth daily.     Marland Kitchen terbinafine (LAMISIL) 250 MG tablet Take 1 tablet (250 mg total) by mouth daily. 30 tablet 2  . thiamine 100 MG tablet Take 1 tablet (100 mg total) by mouth daily. 30 tablet 2  . Vitamin D, Ergocalciferol, (DRISDOL) 50000 UNITS CAPS capsule Take 50,000 Units by mouth once a week.  0    No current facility-administered medications for this visit.    Allergies:   Review of patient's allergies indicates no known allergies.    Social History:  The patient  reports that he has quit smoking. His smoking use included Cigarettes. He started smoking about 3 years ago. He has a 15 pack-year smoking history. He has never used smokeless tobacco. He reports that he drinks alcohol. He reports that he does not use illicit drugs.   Family History:  The patient's family history includes Heart disease in his mother; Heart failure in his father; Stroke in his mother. There is no history of CAD or Heart attack.    ROS:   Please see the history of present illness.   Review of Systems  All other systems reviewed and are negative.    PHYSICAL EXAM: VS:  BP 158/88 mmHg  Pulse 42  Ht 6' 2.5" (1.892 m)  Wt 202 lb 6.4 oz (91.808 kg)  BMI 25.65 kg/m2    Wt Readings from Last 3 Encounters:  06/15/15 202 lb 6.4 oz (91.808 kg)  01/27/15 208 lb (94.348 kg)  12/10/14 204 lb (92.534 kg)     GEN: Well nourished, well developed, in no acute distress HEENT: normal Neck: no JVD, no carotid bruits, no masses Cardiac:  Normal S1/S2, RRR,  no murmur ,  no rubs or gallops, no edema   Respiratory:  clear to auscultation bilaterally, no wheezing, rhonchi or rales. GI: soft, nontender, nondistended, + BS MS: no deformity or atrophy Skin: warm and dry  Neuro:  CNs II-XII intact, Strength and sensation are intact Psych: Normal affect   EKG:  EKG is ordered today.  It demonstrates:   Sinus brady, HR 42, normal axis, NSSTTW changes, QTc 450 ms   Recent Labs: 09/29/2014: B Natriuretic Peptide 745.9*; Magnesium 1.9 01/14/2015: ALT 17 06/15/2015: BUN 23; Creat 1.16; Hemoglobin 14.6; Platelets 322; Potassium 4.3; Sodium 143; TSH 1.569    Lipid Panel    Component Value Date/Time   CHOL 122 01/14/2015 1147   TRIG 92.0 01/14/2015 1147   HDL 43.90 01/14/2015 1147   CHOLHDL 3 01/14/2015 1147    VLDL 18.4 01/14/2015 1147   LDLCALC 60 01/14/2015 1147      ASSESSMENT AND PLAN:  1. Chronic systolic CHF (congestive heart failure):  Volume stable.  Continue current Rx.  He is NYHA 2.       2. NICM (nonischemic cardiomyopathy):  EF improved to 50% in normal sinus rhythm. Presumed tachycardia mediated cardiomyopathy.  Continue beta-blocker, angiotensin receptor blocker.  3. PAF (paroxysmal atrial fibrillation):   Maintaining NSR.  If he has recurrent AFib, will need  to start AAD therapy (Amiodarone vs Tikosyn) to maintain rhythm control.  Continue Eliquis.  Check FU BMET, CBC today.  4. Coronary artery disease:   Mild nonobstructive coronary artery disease by cardiac catheterization earlier this year. No angina.  No ASA as he is on Eliquis.  Continue statin.  5. Essential hypertension:  Borderline control.  If BP remains < [id number] systolic, consider increasing Amlodipine to 10 mg QD.    6. Hyperlipidemia:  Continue statin. LDL in 7/16 was 60.  7. Bradycardia:  He is asymptomatic.  Decrease metoprolol tartrate to 25 mg bid.  Return in 1 week for ECG and BP check with RN.    8. OSA:  Mild to mod OSA by recent sleep study.  He is hesitant to wear CPAP.  We discussed the importance of OSA treatment as it relates to AFib.     Medication Adjustments: Current medicines are reviewed at length with the patient today.  Any concerns are as outlined above. The following changes have been made:   Discontinued Medications   No medications on file   New Prescriptions   No medications on file   Modified Medications   Modified Medication Previous Medication   METOPROLOL (LOPRESSOR) 25 MG TABLET metoprolol (LOPRESSOR) 50 MG tablet      Take 1 tablet (25 mg total) by mouth 2 (two) times daily.    Take 1 tablet (50 mg total) by mouth 2 (two) times daily.   Labs/ tests ordered today include:  Orders Placed This Encounter  Procedures  . Basic metabolic panel  . CBC w/Diff  . TSH  . EKG 12-Lead   . PR OFFICE OUTPATIENT VISIT 5 MINUTES    Disposition:    FU Dr. Casandra Doffing 6 mos.     Signed, Versie Starks, MHS 06/15/2015 4:22 PM    [facility] [address], [place], [place]  [address] Phone: [phone]; Fax: [phone]

## 2015-06-15 ENCOUNTER — Encounter: Payer: Self-pay | Admitting: Physician Assistant

## 2015-06-15 ENCOUNTER — Ambulatory Visit (INDEPENDENT_AMBULATORY_CARE_PROVIDER_SITE_OTHER): Payer: Medicare Other | Admitting: Physician Assistant

## 2015-06-15 VITALS — BP 158/88 | HR 42 | Ht 74.5 in | Wt 202.4 lb

## 2015-06-15 DIAGNOSIS — E785 Hyperlipidemia, unspecified: Secondary | ICD-10-CM

## 2015-06-15 DIAGNOSIS — I5022 Chronic systolic (congestive) heart failure: Secondary | ICD-10-CM | POA: Diagnosis not present

## 2015-06-15 DIAGNOSIS — I2510 Atherosclerotic heart disease of native coronary artery without angina pectoris: Secondary | ICD-10-CM

## 2015-06-15 DIAGNOSIS — I428 Other cardiomyopathies: Secondary | ICD-10-CM

## 2015-06-15 DIAGNOSIS — G4733 Obstructive sleep apnea (adult) (pediatric): Secondary | ICD-10-CM

## 2015-06-15 DIAGNOSIS — I480 Paroxysmal atrial fibrillation: Secondary | ICD-10-CM | POA: Diagnosis not present

## 2015-06-15 DIAGNOSIS — I429 Cardiomyopathy, unspecified: Secondary | ICD-10-CM

## 2015-06-15 DIAGNOSIS — R001 Bradycardia, unspecified: Secondary | ICD-10-CM

## 2015-06-15 DIAGNOSIS — I10 Essential (primary) hypertension: Secondary | ICD-10-CM

## 2015-06-15 LAB — CBC WITH DIFFERENTIAL/PLATELET
Basophils Absolute: 0.1 10*3/uL (ref 0.0–0.1)
Basophils Relative: 1 % (ref 0–1)
EOS ABS: 0.1 10*3/uL (ref 0.0–0.7)
Eosinophils Relative: 1 % (ref 0–5)
HCT: 42.5 % (ref 39.0–52.0)
Hemoglobin: 14.6 g/dL (ref 13.0–17.0)
Lymphocytes Relative: 19 % (ref 12–46)
Lymphs Abs: 1.8 10*3/uL (ref 0.7–4.0)
MCH: 32.6 pg (ref 26.0–34.0)
MCHC: 34.4 g/dL (ref 30.0–36.0)
MCV: 94.9 fL (ref 78.0–100.0)
MPV: 9.6 fL (ref 8.6–12.4)
Monocytes Absolute: 0.9 10*3/uL (ref 0.1–1.0)
Monocytes Relative: 9 % (ref 3–12)
Neutro Abs: 6.8 10*3/uL (ref 1.7–7.7)
Neutrophils Relative %: 70 % (ref 43–77)
PLATELETS: 322 10*3/uL (ref 150–400)
RBC: 4.48 MIL/uL (ref 4.22–5.81)
RDW: 13.1 % (ref 11.5–15.5)
WBC: 9.7 10*3/uL (ref 4.0–10.5)

## 2015-06-15 LAB — BASIC METABOLIC PANEL
BUN: 23 mg/dL (ref 7–25)
CO2: 27 mmol/L (ref 20–31)
CREATININE: 1.16 mg/dL (ref 0.70–1.18)
Calcium: 9.1 mg/dL (ref 8.6–10.3)
Chloride: 108 mmol/L (ref 98–110)
Glucose, Bld: 92 mg/dL (ref 65–99)
Potassium: 4.3 mmol/L (ref 3.5–5.3)
SODIUM: 143 mmol/L (ref 135–146)

## 2015-06-15 LAB — TSH: TSH: 1.569 u[IU]/mL (ref 0.350–4.500)

## 2015-06-15 MED ORDER — METOPROLOL TARTRATE 25 MG PO TABS: 25.0000 mg | ORAL_TABLET | Freq: Two times a day (BID) | ORAL | Status: DC

## 2015-06-15 NOTE — Patient Instructions (Signed)
Medication Instructions:  1. DECREASE METOPROLOL TO 25 MG TWICE DAILY; NEW RX SENT IN FOR THE 25 MG TABLET  Labwork: TODAY BMET, CBC W/DIFF, TSH  Testing/Procedures: NONE  Follow-Up: 1. Your physician wants you to follow-up in: [place] DR. VARANASI. You will receive a reminder letter in the mail two months in advance. If you don't receive a letter, please call our office to schedule the follow-up appointment.  2. YOU NEED TO SCHEDULE A NURSE VISIT TO BE DONE IN 1 WEEK FOR A BP CHECK AND AN EKG  Any Other Special Instructions Will Be Listed Below (If Applicable).   If you need a refill on your cardiac medications before your next appointment, please call your pharmacy.

## 2015-06-17 ENCOUNTER — Telehealth: Payer: Self-pay | Admitting: Physician Assistant

## 2015-06-17 NOTE — Telephone Encounter (Signed)
Pt notified of lab results and to continue on current dose of Eliquis. Pt verbalized understanding to plan of care.

## 2015-06-17 NOTE — Telephone Encounter (Signed)
F/u  Pt returning rn call- concerning lab wor- Please call back and discuss.

## 2015-06-22 ENCOUNTER — Ambulatory Visit (INDEPENDENT_AMBULATORY_CARE_PROVIDER_SITE_OTHER): Payer: Medicare Other

## 2015-06-22 ENCOUNTER — Telehealth: Payer: Self-pay

## 2015-06-22 DIAGNOSIS — Z0130 Encounter for examination of blood pressure without abnormal findings: Secondary | ICD-10-CM

## 2015-06-22 DIAGNOSIS — I309 Acute pericarditis, unspecified: Secondary | ICD-10-CM | POA: Diagnosis not present

## 2015-06-22 DIAGNOSIS — Z136 Encounter for screening for cardiovascular disorders: Secondary | ICD-10-CM | POA: Diagnosis not present

## 2015-06-22 NOTE — Progress Notes (Signed)
Here for one week follow up nurse check BP and EKG Patient without complaints or questions Continues on new dose or lopressor decreased by 1/2 one week ago BP 146/76 EKG complete BP, new and old EKG reviewed by Dr. Mare Ferrari Ordered to continue with current plan of care and follow up with Dr. Irish Lack Follow up for Dr. Irish Lack is to be in 6 months.  Routed to [employer] and Dr. Irish Lack Would you like patient to follow up sooner than 6 months

## 2015-06-22 NOTE — Patient Instructions (Signed)
Continue with current plan of care  We will call with a new appointment if Dr. Lovie Macadamia or Richardson Dopp PA-C if they want to see you sooner than 6 months

## 2015-06-22 NOTE — Telephone Encounter (Signed)
Here for one week follow up nurse check BP and EKG Patient without complaints or questions Continues on new dose or lopressor decreased by 1/2 one week ago BP 146/76 EKG complete BP, new and old EKG reviewed by Dr. Mare Ferrari Ordered to continue with current plan of care and follow up with Dr. Irish Lack Follow up for Dr. Irish Lack is to be in 6 months.  Routed to [employer] and Dr. Irish Lack Would you like patient to follow up sooner than 6 months

## 2015-06-22 NOTE — Telephone Encounter (Signed)
DC Metoprolol Tartrate Start Toprol XL 25 mg QD Increase Norvasc to 10 mg QD FU with me in 4-6 weeks. Richardson Dopp, PA-C   06/22/2015 9:54 PM

## 2015-06-23 ENCOUNTER — Telehealth: Payer: Self-pay

## 2015-06-23 MED ORDER — AMLODIPINE BESYLATE 5 MG PO TABS: 10.0000 mg | ORAL_TABLET | Freq: Every day | ORAL | Status: DC

## 2015-06-23 MED ORDER — METOPROLOL SUCCINATE ER 25 MG PO TB24: 25.0000 mg | ORAL_TABLET | Freq: Every day | ORAL | Status: DC

## 2015-06-23 NOTE — Telephone Encounter (Signed)
Instructions given to patient per Richardson Dopp Repeated instructions correctly Told patient he will be hearing from [place], Charlett Nose to schedule an appointment with Richardson Dopp for 4-6 weeks Instructed patient to call us back for any questions, concerns or issues

## 2015-06-23 NOTE — Telephone Encounter (Signed)
Per Richardson Dopp:  DC Metoprolol Tartrate Start Toprol XL 25 mg QD Increase Norvasc to 10 mg QD FU with me in 4-6 weeks. Richardson Dopp, PA-C

## 2015-06-26 NOTE — Telephone Encounter (Signed)
Can see how he does with f/u with Scott.

## 2015-07-09 ENCOUNTER — Other Ambulatory Visit: Payer: Self-pay

## 2015-07-09 MED ORDER — FUROSEMIDE 40 MG PO TABS: 40.0000 mg | ORAL_TABLET | Freq: Every day | ORAL | Status: DC

## 2015-07-09 NOTE — Telephone Encounter (Signed)
Liliane Shi, PA-C at 06/14/2015 3:17 PM  furosemide (LASIX) 40 MG tabletTake 1 tablet (40 mg total) by mouth daily Patient Instructions     Medication Instructions:  1. DECREASE METOPROLOL TO 25 MG TWICE DAILY; NEW RX SENT IN FOR THE 25 MG TABLET

## 2015-07-15 ENCOUNTER — Encounter: Payer: Self-pay | Admitting: Podiatry

## 2015-07-15 ENCOUNTER — Ambulatory Visit (INDEPENDENT_AMBULATORY_CARE_PROVIDER_SITE_OTHER): Payer: Medicare Other | Admitting: Podiatry

## 2015-07-15 VITALS — BP 117/60 | HR 64 | Temp 97.2°F | Resp 12

## 2015-07-15 DIAGNOSIS — B351 Tinea unguium: Secondary | ICD-10-CM | POA: Diagnosis not present

## 2015-07-15 DIAGNOSIS — M79676 Pain in unspecified toe(s): Secondary | ICD-10-CM | POA: Diagnosis not present

## 2015-07-15 NOTE — Progress Notes (Signed)
   Subjective:    Patient ID: Bruce Cohen, male    DOB: Apr 28, 1941, 75 y.o.   MRN: [id number]  HPI This patient presents today complaining that his toenails are thickened and elongated and uncomfortable requesting toenail debridement. Patient has completed 90 doses of terbinafine and 2015 without a complaint from the medication. He states that he's not notice much change in the nail plates after completing terbinafine 250 mg   Review of Systems  Musculoskeletal: Positive for joint swelling.  Skin: Positive for color change.       Objective:   Physical Exam  Orientated 3 No open skin lesions bilaterally The toenails 6-10 are brittle, yellow, discolored, hypertrophic with occasional proximal clearing in some of the nail plates      Assessment & Plan:   Assessment: Minimal clinical improvement of onychomycoses 6-10 post terbinafine therapy Symptomatic onychomycoses 6-10  Plan: Today I informed patient that clinically the nails demonstrated mycotic infection with minimal clinical improvement. I suggested that no further oral medication. I recommended debridement of the toenails and he verbally consents The toenails 6-10 were debrided mechanically and electrically without any bleeding  Reappoint at patient's request

## 2015-07-29 ENCOUNTER — Ambulatory Visit: Payer: Medicare Other | Admitting: Physician Assistant

## 2015-08-14 ENCOUNTER — Encounter (HOSPITAL_BASED_OUTPATIENT_CLINIC_OR_DEPARTMENT_OTHER): Payer: Medicare Other

## 2015-08-26 ENCOUNTER — Other Ambulatory Visit: Payer: Self-pay

## 2015-08-26 MED ORDER — FUROSEMIDE 40 MG PO TABS: 40.0000 mg | ORAL_TABLET | Freq: Every day | ORAL | Status: DC

## 2015-09-25 ENCOUNTER — Other Ambulatory Visit: Payer: Self-pay | Admitting: *Deleted

## 2015-09-25 MED ORDER — FUROSEMIDE 40 MG PO TABS: 40.0000 mg | ORAL_TABLET | Freq: Every day | ORAL | Status: DC

## 2015-10-05 ENCOUNTER — Emergency Department (HOSPITAL_COMMUNITY): Payer: Medicare Other

## 2015-10-05 ENCOUNTER — Encounter (HOSPITAL_COMMUNITY): Payer: Self-pay | Admitting: *Deleted

## 2015-10-05 ENCOUNTER — Emergency Department (HOSPITAL_COMMUNITY)
Admission: EM | Admit: 2015-10-05 | Discharge: 2015-10-05 | Disposition: A | Discharge status: Home / Self Care | Payer: Medicare Other | Attending: Emergency Medicine | Admitting: Emergency Medicine

## 2015-10-05 DIAGNOSIS — I10 Essential (primary) hypertension: Secondary | ICD-10-CM | POA: Diagnosis not present

## 2015-10-05 DIAGNOSIS — Z87891 Personal history of nicotine dependence: Secondary | ICD-10-CM | POA: Diagnosis not present

## 2015-10-05 DIAGNOSIS — R011 Cardiac murmur, unspecified: Secondary | ICD-10-CM | POA: Insufficient documentation

## 2015-10-05 DIAGNOSIS — R0602 Shortness of breath: Secondary | ICD-10-CM

## 2015-10-05 DIAGNOSIS — Z79899 Other long term (current) drug therapy: Secondary | ICD-10-CM | POA: Insufficient documentation

## 2015-10-05 DIAGNOSIS — E785 Hyperlipidemia, unspecified: Secondary | ICD-10-CM | POA: Diagnosis not present

## 2015-10-05 DIAGNOSIS — Z8669 Personal history of other diseases of the nervous system and sense organs: Secondary | ICD-10-CM | POA: Insufficient documentation

## 2015-10-05 DIAGNOSIS — I4891 Unspecified atrial fibrillation: Secondary | ICD-10-CM | POA: Diagnosis not present

## 2015-10-05 LAB — BASIC METABOLIC PANEL
ANION GAP: 10 (ref 5–15)
BUN: 21 mg/dL — ABNORMAL HIGH (ref 6–20)
CALCIUM: 9 mg/dL (ref 8.9–10.3)
CHLORIDE: 109 mmol/L (ref 101–111)
CO2: 23 mmol/L (ref 22–32)
CREATININE: 1.43 mg/dL — AB (ref 0.61–1.24)
GFR calc non Af Amer: 46 mL/min — ABNORMAL LOW (ref >60)
GFR, EST AFRICAN AMERICAN: 54 mL/min — AB (ref >60)
Glucose, Bld: 111 mg/dL — ABNORMAL HIGH (ref 65–99)
Potassium: 3.8 mmol/L (ref 3.5–5.1)
SODIUM: 142 mmol/L (ref 135–145)

## 2015-10-05 LAB — I-STAT TROPONIN, ED: Troponin i, poc: 0.02 ng/mL (ref 0.00–0.08)

## 2015-10-05 LAB — BRAIN NATRIURETIC PEPTIDE: B Natriuretic Peptide: 373.5 pg/mL — ABNORMAL HIGH (ref 0.0–100.0)

## 2015-10-05 LAB — CBC
HCT: 44.4 % (ref 39.0–52.0)
HEMOGLOBIN: 15.5 g/dL (ref 13.0–17.0)
MCH: 32.8 pg (ref 26.0–34.0)
MCHC: 34.9 g/dL (ref 30.0–36.0)
MCV: 94.1 fL (ref 78.0–100.0)
Platelets: 391 10*3/uL (ref 150–400)
RBC: 4.72 MIL/uL (ref 4.22–5.81)
RDW: 13.6 % (ref 11.5–15.5)
WBC: 10 10*3/uL (ref 4.0–10.5)

## 2015-10-05 LAB — D-DIMER, QUANTITATIVE: D-Dimer, Quant: <0.27 ug/mL-FEU (ref 0.00–0.50)

## 2015-10-05 NOTE — Discharge Instructions (Signed)
Please read and follow all provided instructions.  Your diagnoses today include:  1. Shortness of breath     Tests performed today include:  Blood counts and electrolytes  Test for blood clot - negative  Test for heart muscle damage - normal  Chest x-ray - no fluid build-up or pneumonia  Vital signs. See below for your results today.   Medications prescribed:   None  Take any prescribed medications only as directed.  Home care instructions:  Follow any educational materials contained in this packet.  BE VERY CAREFUL not to take multiple medicines containing Tylenol (also called acetaminophen). Doing so can lead to an overdose which can damage your liver and cause liver failure and possibly death.   Follow-up instructions: Please follow-up with your primary care provider in the next 2 days for further evaluation of your symptoms.   Return instructions:   Please return to the Emergency Department if you experience worsening symptoms.   Return With worsening shortness of breath, increased work of breathing, chest pain, fever.  Please return if you have any other emergent concerns.  Additional Information:  Your vital signs today were: BP 118/68 mmHg   Pulse 59   Temp(Src) 97.7 F (36.5 C) (Oral)   Resp 24   SpO2 97% If your blood pressure (BP) was elevated above 135/85 this visit, please have this repeated by your doctor within one month. --------------

## 2015-10-05 NOTE — ED Notes (Signed)
Pt ambulated down the hallway. Pt reports pressure building in his head. Pt remains on the O2. Oxygen Saturation between 96% and 100%. Pt reports slight Shortness of breath while walking. "Not as bad as yesterday"

## 2015-10-05 NOTE — ED Notes (Signed)
Patient hostile in attitude in waiting room.  Has "flipped offLawyer when told how long his wait would be.  Explained that he has every right to leave if he deemed necessary;  just to please let the nurse know.

## 2015-10-05 NOTE — ED Provider Notes (Signed)
  Face-to-face evaluation   History: Patient presents for evaluation of shortness of breath, present for several days. It comes and goes. No extremity swelling, fever or cough.  Physical exam: Alert, calm, cooperative, elderly man. Lungs clear anteriorly. Abdomen soft, nontender.  Initial clinical impression: Nonspecific shortness of breath, with reassuring vital signs. He will require evaluation for occult PE, prior to consideration for discharge.   Plan: ED evaluation is reassuring. There is no indication for further assessment this time.   Medical screening examination/treatment/procedure(s) were conducted as a shared visit with non-physician practitioner(s) and myself.  I personally evaluated the patient during the encounter   Daleen Bo, MD 10/07/15 0005

## 2015-10-05 NOTE — ED Provider Notes (Signed)
CSN: [id number]     Arrival date & time 10/05/15  1328 History   First MD Initiated Contact with Patient 10/05/15 1804     Chief Complaint  Patient presents with  . Shortness of Breath     (Consider location/radiation/quality/duration/timing/severity/associated sxs/prior Treatment) HPI Comments: Patient with history of congestive heart failure on Lasix, nonobstructive disease on latest cardiac catheterization performed 09/2014, atrial fibrillation on Eliquis -- presents with complaint of worsening shortness of breath for the past 5 days. Shortness of breath is much worse with ambulation and patient states that he takes 5 or 6 steps before becoming very short of breath. He recovers slowly when he rests. Denies any chest pains. No fever or cough. No abdominal pain. No bleeding in the stool or urine. States he is eating and drinking well. States he has been compliant with his medications. No LE edema. Patient states that he was worked up last year for shortness of breath and did not want his symptoms to become more severe. The onset of this condition was acute. The course is constant. Aggravating factors: none. Alleviating factors: none.    Patient is a 75 y.o. male presenting with shortness of breath. The history is provided by the patient and medical records.  Shortness of Breath Associated symptoms: no abdominal pain, no chest pain, no cough, no fever, no headaches, no rash, no sore throat, no vomiting and no wheezing     Past Medical History  Diagnosis Date  . LVH (left ventricular hypertrophy)   . Mitral regurgitation   . Pericarditis     a. Hospitalized in [place]. Episode in 1998. Had a cath which was clean (also in 1990s). 2 further episodes that were mild.  . Hyperlipidemia   . Hypertension   . Atrial fibrillation with RVR ([place]) 09/2014    Newly recognized/notes 09/29/2014  . Heart murmur   . History of echocardiogram     a. Echo 6/16:  EF 50% with mild apical HK, mild LVH, Gr 2 DD,  mild MR, severe BAE, PASP 43 mmHg  . OSA (obstructive sleep apnea) 02/12/2015    Mild to moderate with AHI 14.7/hr   Past Surgical History  Procedure Laterality Date  . Cardiac catheterization  1998    "clean"  . Tonsillectomy    . Appendectomy    . Back surgery    . Lumbar laminectomy  1970's    "partial"  . Orif finger / thumb fracture Left ~ 2006    "thumb"  . Fracture surgery    . Left heart catheterization with coronary angiogram N/A 10/01/2014    Procedure: LEFT HEART CATHETERIZATION WITH CORONARY ANGIOGRAM;  Surgeon: Wellington Hampshire, MD;  Location: [facility] CATH LAB;  Service: Cardiovascular;  Laterality: N/A;  . Cardioversion N/A 11/21/2014    Procedure: CARDIOVERSION;  Surgeon: Fay Records, MD;  Location: [facility] ENDOSCOPY;  Service: Cardiovascular;  Laterality: N/A;   Family History  Problem Relation Age of Onset  . Heart disease Mother     VALVE SURGERY  . Heart failure Father   . CAD Neg Hx   . Heart attack Neg Hx   . Stroke Mother    Social History  Substance Use Topics  . Smoking status: Former Smoker -- 0.25 packs/day for 60 years    Types: Cigarettes    Start date: 09/06/2011  . Smokeless tobacco: Never Used  . Alcohol Use: 0.0 oz/week    0 Standard drinks or equivalent per week     Comment:  09/29/2014 "maybe 3-4 beers/month"    Review of Systems  Constitutional: Negative for fever.  HENT: Negative for rhinorrhea and sore throat.   Eyes: Negative for redness.  Respiratory: Positive for shortness of breath. Negative for cough and wheezing.   Cardiovascular: Negative for chest pain and leg swelling.  Gastrointestinal: Negative for nausea, vomiting, abdominal pain and diarrhea.  Genitourinary: Negative for dysuria.  Musculoskeletal: Negative for myalgias.  Skin: Negative for rash.  Neurological: Negative for headaches.    Allergies  Review of patient's allergies indicates no known allergies.  Home Medications   Prior to Admission medications   Medication Sig  Start Date End Date Taking? Authorizing Provider  amLODipine (NORVASC) 5 MG tablet Take 2 tablets (10 mg total) by mouth daily. 06/23/15   Liliane Shi, PA-C  apixaban (ELIQUIS) 5 MG TABS tablet Take 1 tablet (5 mg total) by mouth 2 (two) times daily. 11/19/14   Liliane Shi, PA-C  atorvastatin (LIPITOR) 40 MG tablet Take 1 tablet (40 mg total) by mouth daily. 11/19/14   Liliane Shi, PA-C  folic acid (FOLVITE) 1 MG tablet Take 1 tablet (1 mg total) by mouth daily. 10/03/14   Oswald Hillock, MD  furosemide (LASIX) 40 MG tablet Take 1 tablet (40 mg total) by mouth daily. 09/25/15   Jettie Booze, MD  losartan (COZAAR) 50 MG tablet Take 1 tablet (50 mg total) by mouth daily. 10/27/14   Liliane Shi, PA-C  metoprolol succinate (TOPROL XL) 25 MG 24 hr tablet Take 1 tablet (25 mg total) by mouth daily. 06/23/15   Liliane Shi, PA-C  Multiple Vitamins-Minerals (MULTIVITAMIN PO) Take 1 tablet by mouth daily.     Historical Provider, MD  terbinafine (LAMISIL) 250 MG tablet Take 1 tablet (250 mg total) by mouth daily. 01/07/15   Gean Birchwood, DPM  thiamine 100 MG tablet Take 1 tablet (100 mg total) by mouth daily. 10/03/14   Oswald Hillock, MD  Vitamin D, Ergocalciferol, (DRISDOL) 50000 UNITS CAPS capsule Take 50,000 Units by mouth once a week. 11/03/14   Historical Provider, MD   BP 127/95 mmHg  Pulse 98  Temp(Src) 97.7 F (36.5 C) (Oral)  Resp 11  SpO2 98%   Physical Exam  Constitutional: He appears well-developed and well-nourished.  HENT:  Head: Normocephalic and atraumatic.  Mouth/Throat: Oropharynx is clear and moist.  Eyes: Conjunctivae are normal. Right eye exhibits no discharge. Left eye exhibits no discharge.  Neck: Normal range of motion. Neck supple.  Cardiovascular: Normal rate and normal heart sounds.  An irregularly irregular rhythm present.  Pulmonary/Chest: Effort normal and breath sounds normal.  Abdominal: Soft. There is no tenderness.  Musculoskeletal: He exhibits no  edema or tenderness.  Neurological: He is alert.  Skin: Skin is warm and dry.  Psychiatric: He has a normal mood and affect.  Nursing note and vitals reviewed.   ED Course  Procedures (including critical care time) Labs Review Labs Reviewed  BASIC METABOLIC PANEL - Abnormal; Notable for the following:    Glucose, Bld 111 (*)    BUN 21 (*)    Creatinine, Ser 1.43 (*)    GFR calc non Af Amer 46 (*)    GFR calc Af Amer 54 (*)    All other components within normal limits  BRAIN NATRIURETIC PEPTIDE - Abnormal; Notable for the following:    B Natriuretic Peptide 373.5 (*)    All other components within normal limits  CBC  D-DIMER, QUANTITATIVE (  NOT AT [facility])  Randolm Idol, ED    Imaging Review Dg Chest 2 View  10/05/2015  CLINICAL DATA:  Shortness of breath for 5 days EXAM: CHEST  2 VIEW COMPARISON:  09/29/2014 FINDINGS: Cardiac shadow is mildly prominent but stable. Diffuse interstitial changes are noted throughout both lungs which are improved when compared with the prior exam. No focal infiltrate is noted. No sizable effusion is seen. No bony abnormality is noted. IMPRESSION: Chronic changes without acute abnormality. Electronically Signed   By: Inez Catalina M.D.   On: 10/05/2015 15:00   I have personally reviewed and evaluated these images and lab results as part of my medical decision-making.   EKG Interpretation   Date/Time:  Monday October 05 2015 13:39:34 EDT Ventricular Rate:  84 PR Interval:    QRS Duration: 106 QT Interval:  418 QTC Calculation: 493 R Axis:   73 Text Interpretation:  Atrial fibrillation Septal infarct , age  undetermined Lateral infarct , age undetermined ST \\T \ T wave abnormality,  consider inferior ischemia Abnormal ECG Since last tracing Atrial  fibrillation has recurred Confirmed by Eulis Foster  MD, ELLIOTT [id number]) on  10/05/2015 6:47:40 PM       7:02 PM Patient ambulated and maintained O2 saturation. Discussed with Dr. Eulis Foster who will see.    8:34 PM BNP is less than when patient had heart failure exacerbation last year. D-dimer is negative.   9:40 PM Discussed results with patient and son at bedside. Patient is eating and appears well. He is agreeable with discharge to home at this time. I encouraged follow-up with his internist this week for recheck. Return to the emergency department with worsening symptoms, worsening shortness of breath, chest pain, fever, or new symptoms. Patient verbalizes understanding and agrees with plan.  BP 118/68 mmHg  Pulse 59  Temp(Src) 97.7 F (36.5 C) (Oral)  Resp 24  SpO2 97%   MDM   Final diagnoses:  Shortness of breath  Atrial fibrillation, unspecified type [facility])   Patient with shortness of breath of unclear etiology. Chest x-ray is clear and does not show pneumonia. No enlarged cardiac silhouette suggestive of pericardial effusion. Normal troponin with nonischemic EKG and out acute coronary syndrome. No clinical signs and symptoms of congestive heart failure, clear x-ray, decreased BNP from previous. No history of COPD or wheezing. D-dimer negative. Patient ambulates without desaturation. He appears well in emergency department. Feel at this point he can be safely discharged to home with outpatient follow-up.    Carlisle Cater, PA-C 10/05/15 2145  Daleen Bo, MD 10/07/15 0005

## 2015-10-05 NOTE — ED Notes (Signed)
Pt reports sob since Thursday. Denies swelling to extremities, fever or cough. spo2 99% and ekg done.

## 2015-10-12 ENCOUNTER — Other Ambulatory Visit: Payer: Self-pay | Admitting: Physician Assistant

## 2015-10-12 ENCOUNTER — Ambulatory Visit (INDEPENDENT_AMBULATORY_CARE_PROVIDER_SITE_OTHER): Payer: Medicare Other | Admitting: Physician Assistant

## 2015-10-12 ENCOUNTER — Encounter: Payer: Self-pay | Admitting: Physician Assistant

## 2015-10-12 VITALS — BP 118/84 | HR 78 | Ht 74.5 in | Wt 198.8 lb

## 2015-10-12 DIAGNOSIS — I2510 Atherosclerotic heart disease of native coronary artery without angina pectoris: Secondary | ICD-10-CM | POA: Diagnosis not present

## 2015-10-12 DIAGNOSIS — I481 Persistent atrial fibrillation: Secondary | ICD-10-CM | POA: Diagnosis not present

## 2015-10-12 DIAGNOSIS — Z0189 Encounter for other specified special examinations: Secondary | ICD-10-CM | POA: Diagnosis not present

## 2015-10-12 DIAGNOSIS — I10 Essential (primary) hypertension: Secondary | ICD-10-CM | POA: Diagnosis not present

## 2015-10-12 DIAGNOSIS — I2583 Coronary atherosclerosis due to lipid rich plaque: Secondary | ICD-10-CM

## 2015-10-12 DIAGNOSIS — I4819 Other persistent atrial fibrillation: Secondary | ICD-10-CM

## 2015-10-12 NOTE — Assessment & Plan Note (Addendum)
Patient is back in atrial fibrillation. He had dyspnea on exertion last year when he had atrial fib as well as heart failure. His heart rates controlled. He is on Eliquis 5 mg twice a day. He may of missed a dose 3 weeks ago but is been on it consistently since then. I discussed this patient in detail with Dr. Dorris Carnes who recommends cardioversion next week. We will arrange. Risk and benefits discussed with patient detail. He is willing to proceed.

## 2015-10-12 NOTE — Assessment & Plan Note (Signed)
Patient had nonobstructive CAD on cath 10/01/14.

## 2015-10-12 NOTE — Progress Notes (Signed)
Cardiology Office Note   Date:  10/12/2015   ID:  Bruce Cohen, DOB October 15, 1940, MRN [id number]  PCP:  Henrine Screws, MD  Cardiologist: Dr. Irish Lack  Chief Complaint: Dyspnea on exertion    History of Present Illness: Bruce Cohen is a 75 y.o. male who presents for dyspnea on exertion Bruce Cohen is a 75 y.o. male with a hx of recurrent pericarditis, HTN, HL.  Admitted 09/2014 with acute systolic HF in the setting of AFib with RVR.  Troponins were elevated.  Echo demonstrated reduced LVF with EF 30-35%.  LHC demonstrated mild non-obstructive CAD.  NICM was thought to likely be due to tachycardia.  There was also concern for ETOH abuse.  After an appropriate interval of uninterrupted anticoagulation, he underwent DCCV 5/13 with restoration of NSR.  Repeat echocardiogram 12/18/14 demonstrated improved function with an EF of 50%.   Patient was recently in the emergency room 10/05/15 for shortness of breath. EKG showed atrial fibrillation had returned. BNP 353 but he was not felt to be in heart failure. Troponin was negative. D-dimer was negative. CT of chest showed some CHF atherosclerosis. S x-ray without CHF  Patient comes in today complaining of the same dyspnea on exertion. He denies chest pain. He does eat quite a bit of salt. He doesn't drink alcohol any longer. He has not had any edema, dizziness or presyncope. He does not feel his heart racing. His rate has been controlled on low-dose metoprolol. He has been maintained on Eliquis twice a day. He may have missed one evening dose 3 weeks ago but has not missed a dose since then.   Past Medical History  Diagnosis Date  . LVH (left ventricular hypertrophy)   . Mitral regurgitation   . Pericarditis     a. Hospitalized in [place]. Episode in 1998. Had a cath which was clean (also in 1990s). 2 further episodes that were mild.  . Hyperlipidemia   . Hypertension   . Atrial fibrillation with RVR ([place]) 09/2014    Newly  recognized/notes 09/29/2014  . Heart murmur   . History of echocardiogram     a. Echo 6/16:  EF 50% with mild apical HK, mild LVH, Gr 2 DD, mild MR, severe BAE, PASP 43 mmHg  . OSA (obstructive sleep apnea) 02/12/2015    Mild to moderate with AHI 14.7/hr    Past Surgical History  Procedure Laterality Date  . Cardiac catheterization  1998    "clean"  . Tonsillectomy    . Appendectomy    . Back surgery    . Lumbar laminectomy  1970's    "partial"  . Orif finger / thumb fracture Left ~ 2006    "thumb"  . Fracture surgery    . Left heart catheterization with coronary angiogram N/A 10/01/2014    Procedure: LEFT HEART CATHETERIZATION WITH CORONARY ANGIOGRAM;  Surgeon: Wellington Hampshire, MD;  Location: [facility] CATH LAB;  Service: Cardiovascular;  Laterality: N/A;  . Cardioversion N/A 11/21/2014    Procedure: CARDIOVERSION;  Surgeon: Fay Records, MD;  Location: [facility] ENDOSCOPY;  Service: Cardiovascular;  Laterality: N/A;     Current Outpatient Prescriptions  Medication Sig Dispense Refill  . amLODipine (NORVASC) 5 MG tablet Take 2 tablets (10 mg total) by mouth daily. 60 tablet 6  . apixaban (ELIQUIS) 5 MG TABS tablet Take 1 tablet (5 mg total) by mouth 2 (two) times daily. 60 tablet 11  . atorvastatin (LIPITOR) 40 MG tablet Take 1 tablet (40 mg  total) by mouth daily. 30 tablet 11  . folic acid (FOLVITE) 1 MG tablet Take 1 tablet (1 mg total) by mouth daily. 30 tablet 2  . furosemide (LASIX) 40 MG tablet Take 1 tablet (40 mg total) by mouth daily. 30 tablet 6  . losartan (COZAAR) 50 MG tablet Take 1 tablet (50 mg total) by mouth daily. 30 tablet 11  . metoprolol succinate (TOPROL XL) 25 MG 24 hr tablet Take 1 tablet (25 mg total) by mouth daily. 90 tablet 3  . Multiple Vitamin (MULTIVITAMIN WITH MINERALS) TABS tablet Take 1 tablet by mouth daily.    Marland Kitchen terbinafine (LAMISIL) 250 MG tablet Take 1 tablet (250 mg total) by mouth daily. 30 tablet 2  . thiamine 100 MG tablet Take 1 tablet (100 mg total) by  mouth daily. 30 tablet 2   No current facility-administered medications for this visit.    Allergies:   Review of patient's allergies indicates no known allergies.    Social History:  The patient  reports that he has quit smoking. His smoking use included Cigarettes. He started smoking about 4 years ago. He has a 15 pack-year smoking history. He has never used smokeless tobacco. He reports that he drinks alcohol. He reports that he does not use illicit drugs.   Family History:  The patient's    family history includes Heart disease in his mother; Heart failure in his father; Stroke in his mother. There is no history of CAD or Heart attack.    ROS:  Please see the history of present illness.   Otherwise, review of systems are positive for none.   All other systems are reviewed and negative.    PHYSICAL EXAM: VS:  BP 118/84 mmHg  Pulse 78  Ht 6' 2.5" (1.892 m)  Wt 198 lb 12.8 oz (90.175 kg)  BMI 25.19 kg/m2  SpO2 97% , BMI Body mass index is 25.19 kg/(m^2). GEN: Well nourished, well developed, in no acute distress Neck: no JVD, HJR, carotid bruits, or masses Cardiac: Irregular irregular; no murmurs,gallop, rubs, thrill or heave,  Respiratory:  Decreased breath sounds throughout but clear to auscultation bilaterally GI: soft, nontender, nondistended, + BS MS: no deformity or atrophy Extremities: without cyanosis, clubbing, edema, good distal pulses bilaterally.  Skin: warm and dry, no rash Neuro:  Strength and sensation are intact    EKG:  EKG is ordered today. The ekg ordered today demonstrates atrial fibrillation at 78 bpm   Recent Labs: 01/14/2015: ALT 17 06/15/2015: TSH 1.569 10/05/2015: B Natriuretic Peptide 373.5*; BUN 21*; Creatinine, Ser 1.43*; Hemoglobin 15.5; Platelets 391; Potassium 3.8; Sodium 142    Lipid Panel    Component Value Date/Time   CHOL 122 01/14/2015 1147   TRIG 92.0 01/14/2015 1147   HDL 43.90 01/14/2015 1147   CHOLHDL 3 01/14/2015 1147   VLDL  18.4 01/14/2015 1147   LDLCALC 60 01/14/2015 1147      Wt Readings from Last 3 Encounters:  10/12/15 198 lb 12.8 oz (90.175 kg)  06/15/15 202 lb 6.4 oz (91.808 kg)  01/27/15 208 lb (94.348 kg)      Other studies Reviewed: Additional studies/ records that were reviewed today include and review of the records demonstrates:   Studies/Reports Reviewed Today:  Echo 12/18/14 EF 50% with mild apical hypokinesis, mild LVH, grade 2 diastolic dysfunction, mild MR, severe BAE, PASP 43 mmHg  LHC 10/01/14 LM:  Normal LAD:   20% proximal 20% mid LCx:   minor irregularities. RCA:  20%  proximal    LVEF:  25 %, there is moderate mitral regurgitation .   Echo 10/01/14 Mild LVH, EF 30-35%, inferior HK, MAC, mild MR, mild LAE, mild RAE, moderate PI      ASSESSMENT AND PLAN: Persistent atrial fibrillation Patient is back in atrial fibrillation. He had dyspnea on exertion last year when he had atrial fib as well as heart failure. His heart rates controlled. He is on Eliquis 5 mg twice a day. He may of missed a dose 3 weeks ago but is been on it consistently since then. I discussed this patient in detail with Dr. Dorris Carnes who recommends cardioversion next week. We will arrange. Risk and benefits discussed with patient detail. He is willing to proceed.  HTN (hypertension) Patient's blood pressure is controlled  CAD (coronary artery disease) Patient had nonobstructive CAD on cath 10/01/14.     Sumner Boast, PA-C  10/12/2015 2:44 PM    [facility] [place], [place],   [address] Phone: [phone]; Fax: [phone]

## 2015-10-12 NOTE — Assessment & Plan Note (Signed)
Patient's blood pressure is controlled

## 2015-10-12 NOTE — Patient Instructions (Signed)
Medication Instructions:  Your physician recommends that you continue on your current medications as directed. Please refer to the Current Medication list given to you today.   Labwork: Your physician recommends that you return for lab work on Thursday, April  6   Testing/Procedures: Your provider has recommended a cardioversion.   You are scheduled for a cardioversion on April 13 at 12:00 pm with Dr. Bronson Ing or associates. Please go to [facility] [place] [facility] at 10:30 am .  Enter through the [place] not have any food or drink after midnight on April 12.  You may take your medicines with a sip of water on the day of your procedure.  You will need someone to drive you home following your procedure.   Call the [place] office at [phone] if you have any questions, problems or concerns.     Electrical Cardioversion Electrical cardioversion is the delivery of a jolt of electricity to change the rhythm of the heart. Sticky patches or metal paddles are placed on the chest to deliver the electricity from a device. This is done to restore a normal rhythm. A rhythm that is too fast or not regular keeps the heart from pumping well. Electrical cardioversion is done in an emergency if:   There is low or no blood pressure as a result of the heart rhythm.   Normal rhythm must be restored as fast as possible to protect the brain and heart from further damage.   It may save a life. Cardioversion may be done for heart rhythms that are not immediately life threatening, such as atrial fibrillation or flutter, in which:   The heart is beating too fast or is not regular.   Medicine to change the rhythm has not worked.   It is safe to wait in order to allow time for preparation.  Symptoms of the abnormal rhythm are bothersome.  The risk of stroke and other serious problems can be reduced.  LET [facility] CARE PROVIDER KNOW  ABOUT:   Any allergies you have.  All medicines you are taking, including vitamins, herbs, eye drops, creams, and over-the-counter medicines.  Previous problems you or members of your family have had with the use of anesthetics.   Any blood disorders you have.   Previous surgeries you have had.   Medical conditions you have.   RISKS AND COMPLICATIONS  Generally, this is a safe procedure. However, problems can occur and include:   Breathing problems related to the anesthetic used.  A blood clot that breaks free and travels to other parts of your body. This could cause a stroke or other problems. The risk of this is lowered by use of blood-thinning medicine (anticoagulant) prior to the procedure.  Cardiac arrest (rare).   BEFORE THE PROCEDURE   You may have tests to detect blood clots in your heart and to evaluate heart function.  You may start taking anticoagulants so your blood does not clot as easily.   Medicines may be given to help stabilize your heart rate and rhythm.   PROCEDURE  You will be given medicine through an IV tube to reduce discomfort and make you sleepy (sedative).   An electrical shock will be delivered.   AFTER THE PROCEDURE Your heart rhythm will be watched to make sure it does not change. You will need someone to drive you home.     Follow-Up: Your physician recommends that you keep your scheduled  follow-up appointment with Dr. Irish Lack.   Any Other Special Instructions Will Be Listed Below (If Applicable).  Low-Sodium Eating Plan Sodium raises blood pressure and causes water to be held in the body. Getting less sodium from food will help lower your blood pressure, reduce any swelling, and protect your heart, liver, and kidneys. We get sodium by adding salt (sodium chloride) to food. Most of our sodium comes from canned, boxed, and frozen foods. Restaurant foods, fast foods, and pizza are also very high in sodium. Even if you take  medicine to lower your blood pressure or to reduce fluid in your body, getting less sodium from your food is important. WHAT IS MY PLAN? Most people should limit their sodium intake to 2,300 mg a day. Your health care provider recommends that you limit your sodium intake to _____2 gram_____ a day.  WHAT DO I NEED TO KNOW ABOUT THIS EATING PLAN? For the low-sodium eating plan, you will follow these general guidelines:  Choose foods with a % Daily Value for sodium of less than 5% (as listed on the food label).   Use salt-free seasonings or herbs instead of table salt or sea salt.   Check with your health care provider or pharmacist before using salt substitutes.   Eat fresh foods.  Eat more vegetables and fruits.  Limit canned vegetables. If you do use them, rinse them well to decrease the sodium.   Limit cheese to 1 oz (28 g) per day.   Eat lower-sodium products, often labeled as "lower sodium" or "no salt added."  Avoid foods that contain monosodium glutamate (MSG). MSG is sometimes added to [place] food and some canned foods.  Check food labels (Nutrition Facts labels) on foods to learn how much sodium is in one serving.  Eat more home-cooked food and less restaurant, buffet, and fast food.  When eating at a restaurant, ask that your food be prepared with less salt, or no salt if possible.  HOW DO I READ FOOD LABELS FOR SODIUM INFORMATION? The Nutrition Facts label lists the amount of sodium in one serving of the food. If you eat more than one serving, you must multiply the listed amount of sodium by the number of servings. Food labels may also identify foods as:  Sodium free--Less than 5 mg in a serving.  Very low sodium--35 mg or less in a serving.  Low sodium--140 mg or less in a serving.  Light in sodium--50% less sodium in a serving. For example, if a food that usually has 300 mg of sodium is changed to become light in sodium, it will have 150 mg of  sodium.  Reduced sodium--25% less sodium in a serving. For example, if a food that usually has 400 mg of sodium is changed to reduced sodium, it will have 300 mg of sodium. WHAT FOODS CAN I EAT? Grains Low-sodium cereals, including oats, puffed wheat and rice, and shredded wheat cereals. Low-sodium crackers. Unsalted rice and pasta. Lower-sodium bread.  Vegetables Frozen or fresh vegetables. Low-sodium or reduced-sodium canned vegetables. Low-sodium or reduced-sodium tomato sauce and paste. Low-sodium or reduced-sodium tomato and vegetable juices.  Fruits Fresh, frozen, and canned fruit. Fruit juice.  Meat and Other Protein Products Low-sodium canned tuna and salmon. Fresh or frozen meat, poultry, seafood, and fish. Lamb. Unsalted nuts. Dried beans, peas, and lentils without added salt. Unsalted canned beans. Homemade soups without salt. Eggs.  Dairy Milk. Soy milk. Ricotta cheese. Low-sodium or reduced-sodium cheeses. Yogurt.  Condiments Fresh and dried  herbs and spices. Salt-free seasonings. Onion and garlic powders. Low-sodium varieties of mustard and ketchup. Fresh or refrigerated horseradish. Lemon juice.  Fats and Oils Reduced-sodium salad dressings. Unsalted butter.  Other Unsalted popcorn and pretzels.  The items listed above may not be a complete list of recommended foods or beverages. Contact your dietitian for more options. WHAT FOODS ARE NOT RECOMMENDED? Grains Instant hot cereals. Bread stuffing, pancake, and biscuit mixes. Croutons. Seasoned rice or pasta mixes. Noodle soup cups. Boxed or frozen macaroni and cheese. Self-rising flour. Regular salted crackers. Vegetables Regular canned vegetables. Regular canned tomato sauce and paste. Regular tomato and vegetable juices. Frozen vegetables in sauces. Salted [place] fries. Olives. Angie Fava. Relishes. Sauerkraut. Salsa. Meat and Other Protein Products Salted, canned, smoked, spiced, or pickled meats, seafood, or fish.  Bacon, ham, sausage, hot dogs, corned beef, chipped beef, and packaged luncheon meats. Salt pork. Jerky. Pickled herring. Anchovies, regular canned tuna, and sardines. Salted nuts. Dairy Processed cheese and cheese spreads. Cheese curds. Blue cheese and cottage cheese. Buttermilk.  Condiments Onion and garlic salt, seasoned salt, table salt, and sea salt. Canned and packaged gravies. Worcestershire sauce. Tartar sauce. Barbecue sauce. Teriyaki sauce. Soy sauce, including reduced sodium. Steak sauce. Fish sauce. Oyster sauce. Cocktail sauce. Horseradish that you find on the shelf. Regular ketchup and mustard. Meat flavorings and tenderizers. Bouillon cubes. Hot sauce. Tabasco sauce. Marinades. Taco seasonings. Relishes. Fats and Oils Regular salad dressings. Salted butter. Margarine. Ghee. Bacon fat.  Other Potato and tortilla chips. Corn chips and puffs. Salted popcorn and pretzels. Canned or dried soups. Pizza. Frozen entrees and pot pies.  The items listed above may not be a complete list of foods and beverages to avoid. Contact your dietitian for more information.   This information is not intended to replace advice given to you by your health care provider. Make sure you discuss any questions you have with your health care provider.   Document Released: 12/17/2001 Document Revised: 07/18/2014 Document Reviewed: 05/01/2013 Elsevier Interactive Patient Education [employer].    If you need a refill on your cardiac medications before your next appointment, please call your pharmacy.

## 2015-10-13 ENCOUNTER — Telehealth: Payer: Self-pay | Admitting: *Deleted

## 2015-10-13 NOTE — Telephone Encounter (Signed)
S/w Kendall at [phone] who schedules DCCV procedure to change the Doctor to [facility]. Didn't have rights yesterday to preform at [facility]. Changed in chart per Delilah Shan.

## 2015-10-14 ENCOUNTER — Ambulatory Visit (INDEPENDENT_AMBULATORY_CARE_PROVIDER_SITE_OTHER): Payer: Medicare Other | Admitting: Podiatry

## 2015-10-14 ENCOUNTER — Encounter: Payer: Self-pay | Admitting: Podiatry

## 2015-10-14 DIAGNOSIS — M79676 Pain in unspecified toe(s): Secondary | ICD-10-CM

## 2015-10-14 DIAGNOSIS — B351 Tinea unguium: Secondary | ICD-10-CM

## 2015-10-14 NOTE — Progress Notes (Signed)
Patient ID: Bruce Cohen, male   DOB: 18-Aug-1940, 75 y.o.   MRN: [id number]  Subjective: Patient presents today complaining of thickened elongated toenails which or cough walking wearing shoes and requests toenail debridement. He has completed 90 doses of terbinafine 250 mg 2016  Objective: Proximal clearing toenails 1-5 left and 1, 2, 4, 5, right. The distal two thirds of the toenails have texture and color changes with hypertrophy and deformity and palpable tenderness  Assessment: Proximal clearing of toenails which may be responding to the terbinafine therapy Symptomatic onychomycoses 9  Plan: Debridement toenails 9 mechanically and electrically without a bleeding  Reappoint 3 months

## 2015-10-15 ENCOUNTER — Other Ambulatory Visit (INDEPENDENT_AMBULATORY_CARE_PROVIDER_SITE_OTHER): Payer: Medicare Other | Admitting: *Deleted

## 2015-10-15 DIAGNOSIS — Z0189 Encounter for other specified special examinations: Secondary | ICD-10-CM

## 2015-10-15 DIAGNOSIS — I4819 Other persistent atrial fibrillation: Secondary | ICD-10-CM

## 2015-10-15 DIAGNOSIS — I2510 Atherosclerotic heart disease of native coronary artery without angina pectoris: Secondary | ICD-10-CM

## 2015-10-15 DIAGNOSIS — I2583 Coronary atherosclerosis due to lipid rich plaque: Secondary | ICD-10-CM

## 2015-10-15 DIAGNOSIS — I10 Essential (primary) hypertension: Secondary | ICD-10-CM

## 2015-10-15 LAB — BASIC METABOLIC PANEL
BUN: 17 mg/dL (ref 7–25)
CO2: 21 mmol/L (ref 20–31)
CREATININE: 1.29 mg/dL — AB (ref 0.70–1.18)
Calcium: 9 mg/dL (ref 8.6–10.3)
Chloride: 103 mmol/L (ref 98–110)
Glucose, Bld: 83 mg/dL (ref 65–99)
POTASSIUM: 4.1 mmol/L (ref 3.5–5.3)
Sodium: 138 mmol/L (ref 135–146)

## 2015-10-15 LAB — CBC WITH DIFFERENTIAL/PLATELET
Basophils Absolute: 0 cells/uL (ref 0–200)
Basophils Relative: 0 %
Eosinophils Absolute: 126 cells/uL (ref 15–500)
Eosinophils Relative: 1 %
HEMATOCRIT: 42.9 % (ref 38.5–50.0)
Hemoglobin: 15.1 g/dL (ref 13.2–17.1)
LYMPHS PCT: 21 %
Lymphs Abs: 2646 cells/uL (ref 850–3900)
MCH: 33.2 pg — ABNORMAL HIGH (ref 27.0–33.0)
MCHC: 35.2 g/dL (ref 32.0–36.0)
MCV: 94.3 fL (ref 80.0–100.0)
MONO ABS: 882 {cells}/uL (ref 200–950)
MONOS PCT: 7 %
MPV: 9 fL (ref 7.5–12.5)
NEUTROS PCT: 71 %
Neutro Abs: 8946 cells/uL — ABNORMAL HIGH (ref 1500–7800)
PLATELETS: 354 10*3/uL (ref 140–400)
RBC: 4.55 MIL/uL (ref 4.20–5.80)
RDW: 14.4 % (ref 11.0–15.0)
WBC: 12.6 10*3/uL — AB (ref 3.8–10.8)

## 2015-10-15 LAB — PROTIME-INR
INR: 1.24 (ref <1.50)
PROTHROMBIN TIME: 15.8 s — AB (ref 11.6–15.2)

## 2015-10-15 LAB — APTT: aPTT: 42 seconds — ABNORMAL HIGH (ref 24–37)

## 2015-10-21 NOTE — Anesthesia Preprocedure Evaluation (Addendum)
Anesthesia Evaluation  Patient identified by MRN, date of birth, ID band Patient awake    Reviewed: Allergy & Precautions, NPO status , Patient's Chart, lab work & pertinent test results  Airway Mallampati: II   Neck ROM: Full    Dental  (+) Teeth Intact, Dental Advisory Given   Pulmonary sleep apnea , COPD, former smoker,    breath sounds clear to auscultation       Cardiovascular hypertension, Pt. on medications + CAD  + dysrhythmias Atrial Fibrillation  Rhythm:Irregular  ECHO 12/2014 EF 50%   Neuro/Psych    GI/Hepatic negative GI ROS, Neg liver ROS,   Endo/Other    Renal/GU Renal InsufficiencyRenal diseaseSTARE III RI     Musculoskeletal   Abdominal   Peds  Hematology   Anesthesia Other Findings   Reproductive/Obstetrics                            Anesthesia Physical Anesthesia Plan  ASA: III  Anesthesia Plan: MAC   Post-op Pain Management:    Induction: Intravenous  Airway Management Planned: Mask and Nasal Cannula  Additional Equipment:   Intra-op Plan:   Post-operative Plan:   Informed Consent: I have reviewed the patients History and Physical, chart, labs and discussed the procedure including the risks, benefits and alternatives for the proposed anesthesia with the patient or authorized representative who has indicated his/her understanding and acceptance.     Plan Discussed with:   Anesthesia Plan Comments:         Anesthesia Quick Evaluation

## 2015-10-22 ENCOUNTER — Ambulatory Visit (HOSPITAL_COMMUNITY)
Admission: RE | Admit: 2015-10-22 | Discharge: 2015-10-22 | Disposition: A | Discharge status: Home / Self Care | Payer: Medicare Other | Source: Ambulatory Visit | Attending: Internal Medicine | Admitting: Internal Medicine

## 2015-10-22 ENCOUNTER — Ambulatory Visit (HOSPITAL_COMMUNITY): Payer: Medicare Other | Admitting: Anesthesiology

## 2015-10-22 ENCOUNTER — Encounter (HOSPITAL_COMMUNITY): Payer: Self-pay | Admitting: Anesthesiology

## 2015-10-22 ENCOUNTER — Encounter (HOSPITAL_COMMUNITY)
Admission: RE | Disposition: A | Discharge status: Home / Self Care | Payer: Self-pay | Source: Ambulatory Visit | Attending: Internal Medicine

## 2015-10-22 DIAGNOSIS — I428 Other cardiomyopathies: Secondary | ICD-10-CM | POA: Diagnosis not present

## 2015-10-22 DIAGNOSIS — I110 Hypertensive heart disease with heart failure: Secondary | ICD-10-CM | POA: Insufficient documentation

## 2015-10-22 DIAGNOSIS — G4733 Obstructive sleep apnea (adult) (pediatric): Secondary | ICD-10-CM | POA: Insufficient documentation

## 2015-10-22 DIAGNOSIS — I481 Persistent atrial fibrillation: Secondary | ICD-10-CM | POA: Diagnosis present

## 2015-10-22 DIAGNOSIS — Z79899 Other long term (current) drug therapy: Secondary | ICD-10-CM | POA: Insufficient documentation

## 2015-10-22 DIAGNOSIS — I4891 Unspecified atrial fibrillation: Secondary | ICD-10-CM | POA: Diagnosis not present

## 2015-10-22 DIAGNOSIS — Z87891 Personal history of nicotine dependence: Secondary | ICD-10-CM | POA: Insufficient documentation

## 2015-10-22 DIAGNOSIS — I2510 Atherosclerotic heart disease of native coronary artery without angina pectoris: Secondary | ICD-10-CM | POA: Insufficient documentation

## 2015-10-22 DIAGNOSIS — E785 Hyperlipidemia, unspecified: Secondary | ICD-10-CM | POA: Diagnosis not present

## 2015-10-22 DIAGNOSIS — J449 Chronic obstructive pulmonary disease, unspecified: Secondary | ICD-10-CM | POA: Insufficient documentation

## 2015-10-22 DIAGNOSIS — I5022 Chronic systolic (congestive) heart failure: Secondary | ICD-10-CM | POA: Insufficient documentation

## 2015-10-22 DIAGNOSIS — Z7901 Long term (current) use of anticoagulants: Secondary | ICD-10-CM | POA: Diagnosis not present

## 2015-10-22 HISTORY — PX: CARDIOVERSION: SHX1299

## 2015-10-22 SURGERY — CARDIOVERSION
Anesthesia: Monitor Anesthesia Care

## 2015-10-22 MED ORDER — PROPOFOL 10 MG/ML IV BOLUS
INTRAVENOUS | Status: DC | PRN
  Administered 2015-10-22: 60 mg via INTRAVENOUS

## 2015-10-22 MED ORDER — SODIUM CHLORIDE 0.9 % IV SOLN
INTRAVENOUS | Status: DC
  Administered 2015-10-22: 500 mL via INTRAVENOUS

## 2015-10-22 MED ORDER — MEPERIDINE HCL 100 MG/ML IJ SOLN: 6.2500 mg | INTRAMUSCULAR | Status: DC | PRN

## 2015-10-22 MED ORDER — SODIUM CHLORIDE 0.9 % IV SOLN
INTRAVENOUS | Status: DC | PRN
  Administered 2015-10-22: 12:00:00 via INTRAVENOUS

## 2015-10-22 MED ORDER — LIDOCAINE HCL (CARDIAC) 20 MG/ML IV SOLN
INTRAVENOUS | Status: DC | PRN
  Administered 2015-10-22: 40 mg via INTRATRACHEAL

## 2015-10-22 NOTE — H&P (View-Only) (Signed)
Cardiology Office Note   Date:  10/12/2015   ID:  Bruce Cohen, DOB [birth year], MRN [id number]  PCP:  Henrine Screws, MD  Cardiologist: Dr. Irish Lack  Chief Complaint: Dyspnea on exertion    History of Present Illness: Bruce Cohen is a 75 y.o. male who presents for dyspnea on exertion Bruce Cohen is a 75 y.o. male with a hx of recurrent pericarditis, HTN, HL.  Admitted 09/2014 with acute systolic HF in the setting of AFib with RVR.  Troponins were elevated.  Echo demonstrated reduced LVF with EF 30-35%.  LHC demonstrated mild non-obstructive CAD.  NICM was thought to likely be due to tachycardia.  There was also concern for ETOH abuse.  After an appropriate interval of uninterrupted anticoagulation, he underwent DCCV 5/13 with restoration of NSR.  Repeat echocardiogram 12/18/14 demonstrated improved function with an EF of 50%.   Patient was recently in the emergency room 10/05/15 for shortness of breath. EKG showed atrial fibrillation had returned. BNP 353 but he was not felt to be in heart failure. Troponin was negative. D-dimer was negative. CT of chest showed some CHF atherosclerosis. S x-ray without CHF  Patient comes in today complaining of the same dyspnea on exertion. He denies chest pain. He does eat quite a bit of salt. He doesn't drink alcohol any longer. He has not had any edema, dizziness or presyncope. He does not feel his heart racing. His rate has been controlled on low-dose metoprolol. He has been maintained on Eliquis twice a day. He may have missed one evening dose 3 weeks ago but has not missed a dose since then.   Past Medical History  Diagnosis Date  . LVH (left ventricular hypertrophy)   . Mitral regurgitation   . Pericarditis     a. Hospitalized in [place]. Episode in 1998. Had a cath which was clean (also in 1990s). 2 further episodes that were mild.  . Hyperlipidemia   . Hypertension   . Atrial fibrillation with RVR ([place]) 09/2014    Newly  recognized/notes 09/29/2014  . Heart murmur   . History of echocardiogram     a. Echo 6/16:  EF 50% with mild apical HK, mild LVH, Gr 2 DD, mild MR, severe BAE, PASP 43 mmHg  . OSA (obstructive sleep apnea) 02/12/2015    Mild to moderate with AHI 14.7/hr    Past Surgical History  Procedure Laterality Date  . Cardiac catheterization  1998    "clean"  . Tonsillectomy    . Appendectomy    . Back surgery    . Lumbar laminectomy  1970's    "partial"  . Orif finger / thumb fracture Left ~ 2006    "thumb"  . Fracture surgery    . Left heart catheterization with coronary angiogram N/A 10/01/2014    Procedure: LEFT HEART CATHETERIZATION WITH CORONARY ANGIOGRAM;  Surgeon: Wellington Hampshire, MD;  Location: [facility] CATH LAB;  Service: Cardiovascular;  Laterality: N/A;  . Cardioversion N/A 11/21/2014    Procedure: CARDIOVERSION;  Surgeon: Fay Records, MD;  Location: [facility] ENDOSCOPY;  Service: Cardiovascular;  Laterality: N/A;     Current Outpatient Prescriptions  Medication Sig Dispense Refill  . amLODipine (NORVASC) 5 MG tablet Take 2 tablets (10 mg total) by mouth daily. 60 tablet 6  . apixaban (ELIQUIS) 5 MG TABS tablet Take 1 tablet (5 mg total) by mouth 2 (two) times daily. 60 tablet 11  . atorvastatin (LIPITOR) 40 MG tablet Take 1 tablet (40 mg  total) by mouth daily. 30 tablet 11  . folic acid (FOLVITE) 1 MG tablet Take 1 tablet (1 mg total) by mouth daily. 30 tablet 2  . furosemide (LASIX) 40 MG tablet Take 1 tablet (40 mg total) by mouth daily. 30 tablet 6  . losartan (COZAAR) 50 MG tablet Take 1 tablet (50 mg total) by mouth daily. 30 tablet 11  . metoprolol succinate (TOPROL XL) 25 MG 24 hr tablet Take 1 tablet (25 mg total) by mouth daily. 90 tablet 3  . Multiple Vitamin (MULTIVITAMIN WITH MINERALS) TABS tablet Take 1 tablet by mouth daily.    Marland Kitchen terbinafine (LAMISIL) 250 MG tablet Take 1 tablet (250 mg total) by mouth daily. 30 tablet 2  . thiamine 100 MG tablet Take 1 tablet (100 mg total) by  mouth daily. 30 tablet 2   No current facility-administered medications for this visit.    Allergies:   Review of patient's allergies indicates no known allergies.    Social History:  The patient  reports that he has quit smoking. His smoking use included Cigarettes. He started smoking about 4 years ago. He has a 15 pack-year smoking history. He has never used smokeless tobacco. He reports that he drinks alcohol. He reports that he does not use illicit drugs.   Family History:  The patient's    family history includes Heart disease in his mother; Heart failure in his father; Stroke in his mother. There is no history of CAD or Heart attack.    ROS:  Please see the history of present illness.   Otherwise, review of systems are positive for none.   All other systems are reviewed and negative.    PHYSICAL EXAM: VS:  BP 118/84 mmHg  Pulse 78  Ht 6' 2.5" (1.892 m)  Wt 198 lb 12.8 oz (90.175 kg)  BMI 25.19 kg/m2  SpO2 97% , BMI Body mass index is 25.19 kg/(m^2). GEN: Well nourished, well developed, in no acute distress Neck: no JVD, HJR, carotid bruits, or masses Cardiac: Irregular irregular; no murmurs,gallop, rubs, thrill or heave,  Respiratory:  Decreased breath sounds throughout but clear to auscultation bilaterally GI: soft, nontender, nondistended, + BS MS: no deformity or atrophy Extremities: without cyanosis, clubbing, edema, good distal pulses bilaterally.  Skin: warm and dry, no rash Neuro:  Strength and sensation are intact    EKG:  EKG is ordered today. The ekg ordered today demonstrates atrial fibrillation at 78 bpm   Recent Labs: 01/14/2015: ALT 17 06/15/2015: TSH 1.569 10/05/2015: B Natriuretic Peptide 373.5*; BUN 21*; Creatinine, Ser 1.43*; Hemoglobin 15.5; Platelets 391; Potassium 3.8; Sodium 142    Lipid Panel    Component Value Date/Time   CHOL 122 01/14/2015 1147   TRIG 92.0 01/14/2015 1147   HDL 43.90 01/14/2015 1147   CHOLHDL 3 01/14/2015 1147   VLDL  18.4 01/14/2015 1147   LDLCALC 60 01/14/2015 1147      Wt Readings from Last 3 Encounters:  10/12/15 198 lb 12.8 oz (90.175 kg)  06/15/15 202 lb 6.4 oz (91.808 kg)  01/27/15 208 lb (94.348 kg)      Other studies Reviewed: Additional studies/ records that were reviewed today include and review of the records demonstrates:   Studies/Reports Reviewed Today:  Echo 12/18/14 EF 50% with mild apical hypokinesis, mild LVH, grade 2 diastolic dysfunction, mild MR, severe BAE, PASP 43 mmHg  LHC 10/01/14 LM:  Normal LAD:   20% proximal 20% mid LCx:   minor irregularities. RCA:  20%  proximal    LVEF:  25 %, there is moderate mitral regurgitation .   Echo 10/01/14 Mild LVH, EF 30-35%, inferior HK, MAC, mild MR, mild LAE, mild RAE, moderate PI      ASSESSMENT AND PLAN: Persistent atrial fibrillation Patient is back in atrial fibrillation. He had dyspnea on exertion last year when he had atrial fib as well as heart failure. His heart rates controlled. He is on Eliquis 5 mg twice a day. He may of missed a dose 3 weeks ago but is been on it consistently since then. I discussed this patient in detail with Dr. Dorris Carnes who recommends cardioversion next week. We will arrange. Risk and benefits discussed with patient detail. He is willing to proceed.  HTN (hypertension) Patient's blood pressure is controlled  CAD (coronary artery disease) Patient had nonobstructive CAD on cath 10/01/14.     Sumner Boast, PA-C  10/12/2015 2:44 PM    [facility] [place], [place], [place]  [address] Phone: [phone]; Fax: [phone]

## 2015-10-22 NOTE — Interval H&P Note (Signed)
History and Physical Interval Note: No interval changes. Will proceed with DCCV as planned.  10/22/2015 10:36 AM  Bruce Cohen  has presented today for surgery, with the diagnosis of AFIB  The various methods of treatment have been discussed with the patient and family. After consideration of risks, benefits and other options for treatment, the patient has consented to  Procedure(s): CARDIOVERSION (N/A) as a surgical intervention .  The patient's history has been reviewed, patient examined, no change in status, stable for surgery.  I have reviewed the patient's chart and labs.  Questions were answered to the patient's satisfaction.     Kate Sable A

## 2015-10-22 NOTE — Discharge Instructions (Signed)
Electrical Cardioversion, Care After °Refer to this sheet in the next few weeks. These instructions provide you with information on caring for yourself after your procedure. Your health care provider may also give you more specific instructions. Your treatment has been planned according to current medical practices, but problems sometimes occur. Call your health care provider if you have any problems or questions after your procedure. °WHAT TO EXPECT AFTER THE PROCEDURE °After your procedure, it is typical to have the following sensations: °· Some redness on the skin where the shocks were delivered. If this is tender, a sunburn lotion or hydrocortisone cream may help. °· Possible return of an abnormal heart rhythm within hours or days after the procedure. °HOME CARE INSTRUCTIONS °· Take medicines only as directed by your health care provider. Be sure you understand how and when to take your medicine. °· Learn how to feel your pulse and check it often. °· Limit your activity for 48 hours after the procedure or as directed by your health care provider. °· Avoid or minimize caffeine and other stimulants as directed by your health care provider. °SEEK MEDICAL CARE IF: °· You feel like your heart is beating too fast or your pulse is not regular. °· You have any questions about your medicines. °· You have bleeding that will not stop. °SEEK IMMEDIATE MEDICAL CARE IF: °· You are dizzy or feel faint. °· It is hard to breathe or you feel short of breath. °· There is a change in discomfort in your chest. °· Your speech is slurred or you have trouble moving an arm or leg on one side of your body. °· You get a serious muscle cramp that does not go away. °· Your fingers or toes turn cold or blue. °  °This information is not intended to replace advice given to you by your health care provider. Make sure you discuss any questions you have with your health care provider. °  °Document Released: 04/17/2013 Document Revised: 07/18/2014  Document Reviewed: 04/17/2013 °Elsevier Interactive Patient Education ©2016 Elsevier Inc. ° °

## 2015-10-22 NOTE — Procedures (Signed)
Elective direct current cardioversion  Indication: Persistent atrial fibrillation  Description of procedure: After informed consent was obtained and preprocedure evaluation by Anesthesia, patient was taken to the procedure room. Timeout performed. Sedation was managed completely by the Anesthesia service, please refer to their documentation for details. Anterior and posterior chest pads were placed and connected to a biphasic defibrillator. A single synchronized shock of 150J was delivered  resulting in restoration of sinus rhythm (one isolated episode of 7-beat non-sustained ventricular tachycardia was noted). The patient remained hemodynamically stable throughout, and there were no immediate complications noted. Follow-up ECG obtained.  Kate Sable, M.D., F.A.C.C.

## 2015-10-22 NOTE — Transfer of Care (Signed)
Immediate Anesthesia Transfer of Care Note  Patient: Bruce Cohen  Procedure(s) Performed: Procedure(s): CARDIOVERSION (N/A)  Patient Location: Endoscopy Unit  Anesthesia Type:General  Level of Consciousness: awake, alert , oriented and patient cooperative  Airway & Oxygen Therapy: Patient Spontanous Breathing and Patient connected to nasal cannula oxygen  Post-op Assessment: Report given to RN and Post -op Vital signs reviewed and stable  Post vital signs: Reviewed and stable  Last Vitals:  Filed Vitals:   10/22/15 0904  BP: 127/73  Pulse: 65  Resp: 12    Complications: No apparent anesthesia complications

## 2015-10-22 NOTE — Anesthesia Postprocedure Evaluation (Signed)
Anesthesia Post Note  Patient: Bruce Cohen  Procedure(s) Performed: Procedure(s) (LRB): CARDIOVERSION (N/A)  Patient location during evaluation: Endoscopy Anesthesia Type: MAC Level of consciousness: awake and alert Pain management: pain level controlled Vital Signs Assessment: post-procedure vital signs reviewed and stable Respiratory status: spontaneous breathing, nonlabored ventilation, respiratory function stable and patient connected to nasal cannula oxygen Cardiovascular status: stable and blood pressure returned to baseline Anesthetic complications: no    Last Vitals:  Filed Vitals:   10/22/15 1223 10/22/15 1230  BP: 105/77 112/73  Pulse: 50 49  Resp: 15 15    Last Pain: There were no vitals filed for this visit.               Alexis Frock

## 2015-10-26 ENCOUNTER — Encounter (HOSPITAL_COMMUNITY): Payer: Self-pay | Admitting: Cardiovascular Disease

## 2015-11-11 ENCOUNTER — Other Ambulatory Visit: Payer: Self-pay

## 2015-11-11 DIAGNOSIS — I5022 Chronic systolic (congestive) heart failure: Secondary | ICD-10-CM

## 2015-11-11 MED ORDER — LOSARTAN POTASSIUM 50 MG PO TABS: 50.0000 mg | ORAL_TABLET | Freq: Every day | ORAL | Status: DC

## 2015-12-01 ENCOUNTER — Ambulatory Visit (INDEPENDENT_AMBULATORY_CARE_PROVIDER_SITE_OTHER): Payer: Medicare Other | Admitting: Interventional Cardiology

## 2015-12-01 ENCOUNTER — Encounter: Payer: Self-pay | Admitting: Interventional Cardiology

## 2015-12-01 VITALS — BP 100/58 | HR 64 | Ht 74.5 in | Wt 197.4 lb

## 2015-12-01 DIAGNOSIS — I10 Essential (primary) hypertension: Secondary | ICD-10-CM | POA: Diagnosis not present

## 2015-12-01 DIAGNOSIS — I309 Acute pericarditis, unspecified: Secondary | ICD-10-CM | POA: Diagnosis not present

## 2015-12-01 DIAGNOSIS — I4891 Unspecified atrial fibrillation: Secondary | ICD-10-CM

## 2015-12-01 DIAGNOSIS — I517 Cardiomegaly: Secondary | ICD-10-CM | POA: Diagnosis not present

## 2015-12-01 DIAGNOSIS — Z0189 Encounter for other specified special examinations: Secondary | ICD-10-CM | POA: Diagnosis not present

## 2015-12-01 DIAGNOSIS — E782 Mixed hyperlipidemia: Secondary | ICD-10-CM

## 2015-12-01 DIAGNOSIS — R001 Bradycardia, unspecified: Secondary | ICD-10-CM

## 2015-12-01 NOTE — Patient Instructions (Signed)
**Note De-identified Bruce Cohen Obfuscation** Medication Instructions:  Same-no changes  Labwork: None  Testing/Procedures: None  Follow-Up: Your physician wants you to follow-up in: 1 year. You will receive a reminder letter in the mail two months in advance. If you don't receive a letter, please call our office to schedule the follow-up appointment.      If you need a refill on your cardiac medications before your next appointment, please call your pharmacy.   

## 2015-12-01 NOTE — Progress Notes (Signed)
Patient ID: Bruce Cohen, male   DOB: 19-Jan-1941, 75 y.o.   MRN: [id number]     Cardiology Office Note   Date:  12/01/2015   ID:  Bruce Cohen, DOB [birth year], MRN [id number]  PCP:  Henrine Screws, MD    No chief complaint on file. f/u AFib   Wt Readings from Last 3 Encounters:  12/01/15 197 lb 6.4 oz (89.54 kg)  10/22/15 198 lb (89.812 kg)  10/12/15 198 lb 12.8 oz (90.175 kg)       History of Present Illness: Bruce Cohen is a 75 y.o. male  who has had recurrent pericarditis. He has been treated with prednisone. He was initially hospitalized in [place] with the first episode. This was in the 1990s in [place]. He had an episode in 1998. He had a cath which was clean. He was treated with prednisone. He Had 2 further episodes which were mild.   In 2016, he presented with congestive heart failure to the hospital. He was found to be in atrial fibrillation with rapid ventricular response. He underwent cardiac catheterization in March 2016 showing no significant coronary artery disease. With medical therapy, his ejection fraction improved to 50% by June 2016.  He has had two cardioversions for AFib.  THe last was in 4/17.  He has DOE when he is in AFib.  No palpitations.  He has not had problems since the last cardioversion.    He does not walk regularly.  He attributes not walking to laziness.    Past Medical History  Diagnosis Date  . LVH (left ventricular hypertrophy)   . Mitral regurgitation   . Pericarditis     a. Hospitalized in [place]. Episode in 1998. Had a cath which was clean (also in 1990s). 2 further episodes that were mild.  . Hyperlipidemia   . Hypertension   . Atrial fibrillation with RVR ([place]) 09/2014    Newly recognized/notes 09/29/2014  . Heart murmur   . History of echocardiogram     a. Echo 6/16:  EF 50% with mild apical HK, mild LVH, Gr 2 DD, mild MR, severe BAE, PASP 43 mmHg  . OSA (obstructive sleep apnea) 02/12/2015    Mild to moderate with  AHI 14.7/hr    Past Surgical History  Procedure Laterality Date  . Cardiac catheterization  1998    "clean"  . Tonsillectomy    . Appendectomy    . Back surgery    . Lumbar laminectomy  1970's    "partial"  . Orif finger / thumb fracture Left ~ 2006    "thumb"  . Fracture surgery    . Left heart catheterization with coronary angiogram N/A 10/01/2014    Procedure: LEFT HEART CATHETERIZATION WITH CORONARY ANGIOGRAM;  Surgeon: Wellington Hampshire, MD;  Location: [facility] CATH LAB;  Service: Cardiovascular;  Laterality: N/A;  . Cardioversion N/A 11/21/2014    Procedure: CARDIOVERSION;  Surgeon: Fay Records, MD;  Location: [facility] ENDOSCOPY;  Service: Cardiovascular;  Laterality: N/A;  . Cardioversion N/A 10/22/2015    Procedure: CARDIOVERSION;  Surgeon: Herminio Commons, MD;  Location: [facility] ENDOSCOPY;  Service: Cardiovascular;  Laterality: N/A;     Current Outpatient Prescriptions  Medication Sig Dispense Refill  . amLODipine (NORVASC) 5 MG tablet Take 2 tablets (10 mg total) by mouth daily. 60 tablet 6  . apixaban (ELIQUIS) 5 MG TABS tablet Take 1 tablet (5 mg total) by mouth 2 (two) times daily. 60 tablet 11  . atorvastatin (LIPITOR) 40 MG  tablet Take 1 tablet (40 mg total) by mouth daily. 30 tablet 11  . folic acid (FOLVITE) 1 MG tablet Take 1 tablet (1 mg total) by mouth daily. 30 tablet 2  . furosemide (LASIX) 40 MG tablet Take 1 tablet (40 mg total) by mouth daily. 30 tablet 6  . losartan (COZAAR) 50 MG tablet Take 1 tablet (50 mg total) by mouth daily. 30 tablet 1  . metoprolol succinate (TOPROL XL) 25 MG 24 hr tablet Take 1 tablet (25 mg total) by mouth daily. 90 tablet 3  . Multiple Vitamin (MULTIVITAMIN WITH MINERALS) TABS tablet Take 1 tablet by mouth daily.    Marland Kitchen thiamine 100 MG tablet Take 1 tablet (100 mg total) by mouth daily. 30 tablet 2   No current facility-administered medications for this visit.    Allergies:   Review of patient's allergies indicates no known allergies.     Social History:  The patient  reports that he has quit smoking. His smoking use included Cigarettes. He started smoking about 4 years ago. He has a 15 pack-year smoking history. He has never used smokeless tobacco. He reports that he drinks alcohol. He reports that he does not use illicit drugs.   Family History:  The patient's family history includes Heart disease in his mother; Heart failure in his father; Stroke in his mother. There is no history of CAD or Heart attack.    ROS:  Please see the history of present illness.   Otherwise, review of systems are positive for DOE when in AFib.   All other systems are reviewed and negative.    PHYSICAL EXAM: VS:  BP 100/58 mmHg  Pulse 64  Ht 6' 2.5" (1.892 m)  Wt 197 lb 6.4 oz (89.54 kg)  BMI 25.01 kg/m2 , BMI Body mass index is 25.01 kg/(m^2). GEN: Well nourished, well developed, in no acute distress HEENT: normal Neck: no JVD, carotid bruits, or masses Cardiac: bradycardia; no murmurs, rubs, or gallops,no edema  Respiratory:  clear to auscultation bilaterally, normal work of breathing GI: soft, nontender, nondistended, + BS MS: no deformity or atrophy Skin: warm and dry, no rash Neuro:  Strength and sensation are intact Psych: euthymic mood, full affect   EKG:   The ekg ordered today demonstrates sinus bradycardia, NSST    Recent Labs: 01/14/2015: ALT 17 06/15/2015: TSH 1.569 10/05/2015: B Natriuretic Peptide 373.5* 10/15/2015: BUN 17; Creat 1.29*; Hemoglobin 15.1; Platelets 354; Potassium 4.1; Sodium 138   Lipid Panel    Component Value Date/Time   CHOL 122 01/14/2015 1147   TRIG 92.0 01/14/2015 1147   HDL 43.90 01/14/2015 1147   CHOLHDL 3 01/14/2015 1147   VLDL 18.4 01/14/2015 1147   LDLCALC 60 01/14/2015 1147     Other studies Reviewed: Additional studies/ records that were reviewed today with results demonstrating: cardiac cath in 2016.   ASSESSMENT AND PLAN:  1. AFib: caused tachycardia induced cardiomyopathy.   Now resolved.  If he has another recurrence, I would start amiodarone and he may need another cardioversion.  Eliquis for stroke prevention.  No bleeding problems.  2. H/o pericarditis:  THis has been under control.   3. LVH: this may predispose him to AFib and CHF sx.   4. Hyperlipidemia: Continue atorvastatin.  Lipids well controlled. 5. Bradycardia: No sx of bradycardia.  He will let us know if there is any change in sx.    Current medicines are reviewed at length with the patient today.  The patient concerns regarding  his medicines were addressed.  The following changes have been made:  No change  Labs/ tests ordered today include:  Orders Placed This Encounter  Procedures  . EKG 12-Lead    Recommend 150 minutes/week of aerobic exercise Low fat, low carb, high fiber diet recommended  Disposition:   FU in 1 year   Signed, Larae Grooms, MD  12/01/2015 9:04 AM    [facility] [place], [place], [place]  [address] Phone: [phone]; Fax: [phone]

## 2015-12-21 ENCOUNTER — Ambulatory Visit: Payer: Medicare Other | Admitting: Interventional Cardiology

## 2015-12-30 ENCOUNTER — Telehealth: Payer: Self-pay | Admitting: Cardiology

## 2015-12-30 NOTE — Telephone Encounter (Signed)
Patient called stating that he was feeling SOB today when he walked down his stairs. He denies SOB at rest, denies trouble breathing, denies chest pain and palpitations. He states that when he is in Afib he feels SOB, similar to how he feels currently. I advised him to go to the ED, patient declined, stating "You can advise me, but I will not go". Patient states that if symptoms persist he will call office in the morning. He has taken his metoprolol today.

## 2015-12-31 ENCOUNTER — Telehealth: Payer: Self-pay | Admitting: Interventional Cardiology

## 2015-12-31 NOTE — Telephone Encounter (Signed)
Would obtain ECG.  If in AFib, would have to start Amiodarone to try to maintain NSR.

## 2015-12-31 NOTE — Progress Notes (Signed)
Cardiology Office Note:    Date:  01/01/2016   ID:  Bruce Cohen, DOB 13-Apr-1941, MRN [id number]  PCP:  Henrine Screws, MD  Cardiologist:  Dr. Casandra Doffing   Electrophysiologist:  n/a  Referring MD: Josetta Huddle, MD   Chief Complaint  Patient presents with  . Shortness of Breath    History of Present Illness:     Bruce Cohen is a 75 y.o. male with a hx of recurrent pericarditis, HTN, HL. Admitted 09/2014 with acute systolic HF in the setting of AFib with RVR. Troponins were elevated. Echo demonstrated reduced LVF with EF 30-35%. LHC demonstrated mild non-obstructive CAD. NICM was thought to likely be due to tachycardia. There was also concern for ETOH abuse. After an appropriate interval of uninterrupted anticoagulation, he underwent DCCV 5/13 with restoration of NSR. Repeat echocardiogram 12/18/14 demonstrated improved function with an EF of 50%.   He had recurrent atrial fibrillation. He underwent repeat DCCV in 4/17. Last seen by Dr. Irish Lack 12/01/15. He called in recently with increasing shortness of breath. He is brought in for further evaluation.  He started to feel more short of breath earlier this week. This is with minimal exertion. He denies edema, orthopnea, PND. Denies chest pain or syncope. Denies palpitations. He has been adherent to Eliquis without missing doses. Denies any bleeding issues.  Past Medical History  Diagnosis Date  . LVH (left ventricular hypertrophy)   . Mitral regurgitation   . Pericarditis     a. Hospitalized in [place]. Episode in 1998. Had a cath which was clean (also in 1990s). 2 further episodes that were mild.  . Hyperlipidemia   . Hypertension   . PAF (paroxysmal atrial fibrillation) ([place]) 09/2014    CHADS2-VASc=5 >> Eliquis // 6/17: Amiodarone started  . Heart murmur   . NICM (nonischemic cardiomyopathy) ([place])     Tachy mediated 2/2 AF with RVR >> a. Echo 3/16: EF 30-35% >> b. Echo 6/16:  EF 50% with mild apical HK, mild  LVH, Gr 2 DD, mild MR, severe BAE, PASP 43 mmHg  . OSA (obstructive sleep apnea) 02/12/2015    Mild to moderate with AHI 14.7/hr  . CAD (coronary artery disease)     a. LHC 3/16: pLAD 20, mLAD 20, pRCA 20, EF 25%    Past Surgical History  Procedure Laterality Date  . Cardiac catheterization  1998    "clean"  . Tonsillectomy    . Appendectomy    . Back surgery    . Lumbar laminectomy  1970's    "partial"  . Orif finger / thumb fracture Left ~ 2006    "thumb"  . Fracture surgery    . Left heart catheterization with coronary angiogram N/A 10/01/2014    Procedure: LEFT HEART CATHETERIZATION WITH CORONARY ANGIOGRAM;  Surgeon: Wellington Hampshire, MD;  Location: [facility] CATH LAB;  Service: Cardiovascular;  Laterality: N/A;  . Cardioversion N/A 11/21/2014    Procedure: CARDIOVERSION;  Surgeon: Fay Records, MD;  Location: [facility] ENDOSCOPY;  Service: Cardiovascular;  Laterality: N/A;  . Cardioversion N/A 10/22/2015    Procedure: CARDIOVERSION;  Surgeon: Herminio Commons, MD;  Location: [facility] ENDOSCOPY;  Service: Cardiovascular;  Laterality: N/A;    Current Medications: Outpatient Prescriptions Prior to Visit  Medication Sig Dispense Refill  . amLODipine (NORVASC) 5 MG tablet Take 2 tablets (10 mg total) by mouth daily. 60 tablet 6  . apixaban (ELIQUIS) 5 MG TABS tablet Take 1 tablet (5 mg total) by mouth 2 (two)  times daily. 60 tablet 11  . atorvastatin (LIPITOR) 40 MG tablet Take 1 tablet (40 mg total) by mouth daily. 30 tablet 11  . folic acid (FOLVITE) 1 MG tablet Take 1 tablet (1 mg total) by mouth daily. 30 tablet 2  . furosemide (LASIX) 40 MG tablet Take 1 tablet (40 mg total) by mouth daily. 30 tablet 6  . losartan (COZAAR) 50 MG tablet Take 1 tablet (50 mg total) by mouth daily. 30 tablet 1  . Multiple Vitamin (MULTIVITAMIN WITH MINERALS) TABS tablet Take 1 tablet by mouth daily.    Marland Kitchen thiamine 100 MG tablet Take 1 tablet (100 mg total) by mouth daily. 30 tablet 2  . metoprolol succinate (TOPROL  XL) 25 MG 24 hr tablet Take 1 tablet (25 mg total) by mouth daily. 90 tablet 3   No facility-administered medications prior to visit.      Allergies:   Review of patient's allergies indicates no known allergies.   Social History   Social History  . Marital Status: Married    Spouse Name: N/A  . Number of Children: N/A  . Years of Education: N/A   Social History Main Topics  . Smoking status: Former Smoker -- 0.25 packs/day for 60 years    Types: Cigarettes    Start date: 09/06/2011  . Smokeless tobacco: Never Used  . Alcohol Use: 0.0 oz/week    0 Standard drinks or equivalent per week     Comment: 09/29/2014 "maybe 3-4 beers/month"  . Drug Use: No  . Sexual Activity: Not Currently   Other Topics Concern  . None   Social History Narrative     Family History:  The patient's family history includes Heart disease in his mother; Heart failure in his father; Stroke in his mother. There is no history of CAD or Heart attack.   ROS:   Please see the history of present illness.    ROS All other systems reviewed and are negative.   Physical Exam:    VS:  BP 102/70 mmHg  Pulse 86  Ht 6' 2.5" (1.892 m)  Wt 195 lb 1.9 oz (88.506 kg)  BMI 24.72 kg/m2   Physical Exam  Constitutional: He is oriented to person, place, and time. He appears well-developed and well-nourished.  HENT:  Head: Normocephalic and atraumatic.  Neck: No JVD present.  Cardiovascular: Normal rate, S1 normal and S2 normal.  An irregularly irregular rhythm present.  No murmur heard. Pulmonary/Chest: Effort normal and breath sounds normal. He has no wheezes. He has no rales.  Abdominal: Soft. There is no tenderness.  Musculoskeletal: He exhibits no edema.  Neurological: He is alert and oriented to person, place, and time.  Skin: Skin is warm and dry.  Psychiatric: He has a normal mood and affect.    Wt Readings from Last 3 Encounters:  01/01/16 195 lb 1.9 oz (88.506 kg)  12/01/15 197 lb 6.4 oz (89.54 kg)    10/22/15 198 lb (89.812 kg)      Studies/Labs Reviewed:     EKG:  EKG is  ordered today.  The ekg ordered today demonstrates Atrial fibrillation, HR 86  Recent Labs: 01/14/2015: ALT 17 06/15/2015: TSH 1.569 10/05/2015: B Natriuretic Peptide 373.5* 10/15/2015: BUN 17; Creat 1.29*; Hemoglobin 15.1; Platelets 354; Potassium 4.1; Sodium 138   Recent Lipid Panel    Component Value Date/Time   CHOL 122 01/14/2015 1147   TRIG 92.0 01/14/2015 1147   HDL 43.90 01/14/2015 1147   CHOLHDL 3 01/14/2015 1147  VLDL 18.4 01/14/2015 1147   LDLCALC 60 01/14/2015 1147    Additional studies/ records that were reviewed today include:   Echo 12/18/14 EF 50% with mild apical hypokinesis, mild LVH, grade 2 diastolic dysfunction, mild MR, severe BAE, PASP 43 mmHg  LHC 10/01/14 LM: Normal LAD: 20% proximal 20% mid LCx: minor irregularities. RCA: 20% proximal  LVEF: 25 %, there is moderate mitral regurgitation .   Echo 10/01/14 Mild LVH, EF 30-35%, inferior HK, MAC, mild MR, mild LAE, mild RAE, moderate PI   ASSESSMENT:     1. Paroxysmal atrial fibrillation (HCC)   2. NICM (nonischemic cardiomyopathy) ([place])   3. Coronary artery disease involving native coronary artery of native heart without angina pectoris   4. Essential hypertension   5. Mixed hyperlipidemia   6. High risk medication use     PLAN:     In order of problems listed above:  1. PAF - He is back in atrial fibrillation. He is quite symptomatic when he is in atrial fibrillation. On exam, he does not appear to be volume overloaded. Dr. Irish Lack, earlier this week, suggested starting him on amiodarone if he was back in atrial fibrillation. We had a long discussion today regarding the risks and benefits of amiodarone. We discussed potential thyroid, liver, pulmonary toxicity. At this point, amiodarone appears to be the most appropriate medication. He has been compliant with Eliquis without missing a dose. He has been taking  metoprolol tartrate at home as well as metoprolol succinate, inadvertently.  -  Start amiodarone 400 mg twice a day 1 week, then 200 mg twice a day  -  Decrease metoprolol tartrate 25 mg twice a day (he tends to be bradycardic in sinus rhythm)  -  Stop Toprol-XL  -  Follow-up in 2 weeks - arrange DCCV at that time if still in atrial fibrillation  -  If he maintains sinus rhythm with amiodarone, he will eventually need pulmonary function tests.  2. NICM (nonischemic cardiomyopathy): EF improved to 50% in normal sinus rhythm. Presumed tachycardia mediated cardiomyopathy. Therefore, maintaining NSR is important. Proceed with antiarrhythmic drug therapy with an eye towards cardioversion as noted above. Continue beta-blocker, angiotensin receptor blocker.  3. Coronary artery disease: Mild nonobstructive coronary artery disease by cardiac catheterization in 2016. He denies angina. No ASA as he is on Eliquis. Continue statin.  4. Essential hypertension: Blood pressure somewhat marginal. Adjust medications as outlined above.   5. Hyperlipidemia: Continue statin.   6. High risk medication - As noted, we discussed potential issues with amiodarone and importance of follow-up lab testing as well as pulmonary function testing and annual eye exams.   Medication Adjustments/Labs and Tests Ordered: Current medicines are reviewed at length with the patient today.  Concerns regarding medicines are outlined above.  Medication changes, Labs and Tests ordered today are outlined in the Patient Instructions noted below. Patient Instructions  Medication Instructions:  The medication you are taking at home that is not on your list that is twice a day is probably Metoprolol Tartrate (Lopressor) 50 mg.  Stay on this medication, but decrease it to 25 mg (1/2 tablet) Twice daily.  If this is not what you have at home, call me back. STOP taking Toprol-XL (Metoprolol Succinate). START Amiodarone 200 mg - take 2  tablets (400 mg) Twice daily for 1 week, then decrease to 1 tablet (200 mg) Twice daily  DO NOT MISS a dose of Eliquis Labwork: None today. Testing/Procedures: None today. Follow-Up: Dr. Casandra Doffing  or Richardson Dopp, PA-C in 2 weeks. Any Other Special Instructions Will Be Listed Below (If Applicable). If you need a refill on your cardiac medications before your next appointment, please call your pharmacy.    Signed, Richardson Dopp, PA-C  01/01/2016 1:14 PM    [facility] [address] [place], [place], [place]  [address] Phone: [phone]; Fax: [phone]

## 2015-12-31 NOTE — Telephone Encounter (Signed)
Spoke with pt and scheduled him to come in 6/23 to see Richardson Dopp, PA-C. Pt verbalized understanding and was in agreement with this plan.

## 2015-12-31 NOTE — Telephone Encounter (Signed)
New Message   Pt c/o Shortness Of Breath: STAT if SOB developed within the last 24 hours or pt is noticeably SOB on the phone  1. Are you currently SOB (can you hear that pt is SOB on the phone)?  Yes   2. How long have you been experiencing SOB? Noon 6/21  3. Are you SOB when sitting or when up moving around? Walking around  4. Are you currently experiencing any other symptoms? Lightheadedness

## 2015-12-31 NOTE — Telephone Encounter (Signed)
Spoke with pt and he states that since yesterday at noon he has had some intermittent SHOB. Pt states SHOB only occurs when he is exerting. Has slight lightheadedness with [facility] but denies any other sx. No vitals available.  Denies [facility] currently. Pt states that Dr. Irish Lack told pt that if he has [facility] again and doesn't go to ER, to call our office because we may need to do a Cardioversion. Pt does feel like he may be in Afib. Pt adamant about making Dr. Irish Lack aware and see what he would like for pt to do. Advised I will send message to Dr. Irish Lack for review and advisement but to proceed to ER if SOB worsens or additional sx occur. Pt verbalized understanding and was in agreement with this plan.

## 2016-01-01 ENCOUNTER — Ambulatory Visit (INDEPENDENT_AMBULATORY_CARE_PROVIDER_SITE_OTHER): Payer: Medicare Other | Admitting: Physician Assistant

## 2016-01-01 ENCOUNTER — Encounter: Payer: Self-pay | Admitting: Physician Assistant

## 2016-01-01 VITALS — BP 102/70 | HR 86 | Ht 74.5 in | Wt 195.1 lb

## 2016-01-01 DIAGNOSIS — I480 Paroxysmal atrial fibrillation: Secondary | ICD-10-CM

## 2016-01-01 DIAGNOSIS — I429 Cardiomyopathy, unspecified: Secondary | ICD-10-CM

## 2016-01-01 DIAGNOSIS — I428 Other cardiomyopathies: Secondary | ICD-10-CM

## 2016-01-01 DIAGNOSIS — I2510 Atherosclerotic heart disease of native coronary artery without angina pectoris: Secondary | ICD-10-CM | POA: Diagnosis not present

## 2016-01-01 DIAGNOSIS — Z79899 Other long term (current) drug therapy: Secondary | ICD-10-CM

## 2016-01-01 DIAGNOSIS — I10 Essential (primary) hypertension: Secondary | ICD-10-CM

## 2016-01-01 DIAGNOSIS — E782 Mixed hyperlipidemia: Secondary | ICD-10-CM

## 2016-01-01 MED ORDER — METOPROLOL TARTRATE 25 MG PO TABS: 25.0000 mg | ORAL_TABLET | Freq: Two times a day (BID) | ORAL | Status: DC

## 2016-01-01 MED ORDER — AMIODARONE HCL 200 MG PO TABS: 200.0000 mg | ORAL_TABLET | ORAL | Status: DC

## 2016-01-01 NOTE — Patient Instructions (Addendum)
Medication Instructions:  The medication you are taking at home that is not on your list that is twice a day is probably Metoprolol Tartrate (Lopressor) 50 mg.  Stay on this medication, but decrease it to 25 mg (1/2 tablet) Twice daily.  If this is not what you have at home, call me back. STOP taking Toprol-XL (Metoprolol Succinate). START Amiodarone 200 mg - take 2 tablets (400 mg) Twice daily for 1 week, then decrease to 1 tablet (200 mg) Twice daily  DO NOT MISS a dose of Eliquis Labwork: None today. Testing/Procedures: None today. Follow-Up: Dr. Casandra Doffing or Richardson Dopp, PA-C in 2 weeks. Any Other Special Instructions Will Be Listed Below (If Applicable). If you need a refill on your cardiac medications before your next appointment, please call your pharmacy.

## 2016-01-14 ENCOUNTER — Other Ambulatory Visit: Payer: Self-pay | Admitting: Interventional Cardiology

## 2016-01-17 NOTE — Progress Notes (Signed)
Cardiology Office Note:    Date:  01/18/2016   ID:  Bruce Cohen, DOB 09/04/1940, MRN [id number]  PCP:  Henrine Screws, MD  Cardiologist:  Dr. Casandra Doffing   Electrophysiologist:  n/a  Referring MD: Josetta Huddle, MD   Chief Complaint  Patient presents with  . Follow-up    Atrial Fibrillation     History of Present Illness:     Bruce Cohen is a 75 y.o. male with a hx of recurrent pericarditis, HTN, HL. Admitted 09/2014 with acute systolic HF in the setting of AFib with RVR. Troponins were elevated. Echo demonstrated reduced LVF with EF 30-35%. LHC demonstrated mild non-obstructive CAD. NICM was thought to likely be due to tachycardia. There was also concern for ETOH abuse. After an appropriate interval of uninterrupted anticoagulation, he underwent DCCV 5/13 with restoration of NSR. Repeat echocardiogram 12/18/14 demonstrated improved function with an EF of 50%.   He had recurrent atrial fibrillation. He underwent repeat DCCV in 4/17. Last seen by Dr. Irish Lack 12/01/15. I saw him 2-3 weeks ago with recurrent AFib.  He was symptomatic.  I started him on Amiodarone and he returns today with an eye towards DCCV.  He is feeling much better.  His breathing is improved.  He denies chest pain, syncope, orthopnea, PND, edema, bleeding issues.    Past Medical History  Diagnosis Date  . LVH (left ventricular hypertrophy)   . Mitral regurgitation   . Pericarditis     a. Hospitalized in [place]. Episode in 1998. Had a cath which was clean (also in 1990s). 2 further episodes that were mild.  . Hyperlipidemia   . Hypertension   . PAF (paroxysmal atrial fibrillation) ([place]) 09/2014    CHADS2-VASc=5 >> Eliquis // 6/17: Amiodarone started  . Heart murmur   . NICM (nonischemic cardiomyopathy) ([place])     Tachy mediated 2/2 AF with RVR >> a. Echo 3/16: EF 30-35% >> b. Echo 6/16:  EF 50% with mild apical HK, mild LVH, Gr 2 DD, mild MR, severe BAE, PASP 43 mmHg  . OSA (obstructive  sleep apnea) 02/12/2015    Mild to moderate with AHI 14.7/hr  . CAD (coronary artery disease)     a. LHC 3/16: pLAD 20, mLAD 20, pRCA 20, EF 25%    Past Surgical History  Procedure Laterality Date  . Cardiac catheterization  1998    "clean"  . Tonsillectomy    . Appendectomy    . Back surgery    . Lumbar laminectomy  1970's    "partial"  . Orif finger / thumb fracture Left ~ 2006    "thumb"  . Fracture surgery    . Left heart catheterization with coronary angiogram N/A 10/01/2014    Procedure: LEFT HEART CATHETERIZATION WITH CORONARY ANGIOGRAM;  Surgeon: Wellington Hampshire, MD;  Location: [facility] CATH LAB;  Service: Cardiovascular;  Laterality: N/A;  . Cardioversion N/A 11/21/2014    Procedure: CARDIOVERSION;  Surgeon: Fay Records, MD;  Location: [facility] ENDOSCOPY;  Service: Cardiovascular;  Laterality: N/A;  . Cardioversion N/A 10/22/2015    Procedure: CARDIOVERSION;  Surgeon: Herminio Commons, MD;  Location: [facility] ENDOSCOPY;  Service: Cardiovascular;  Laterality: N/A;    Current Medications: Outpatient Prescriptions Prior to Visit  Medication Sig Dispense Refill  . amLODipine (NORVASC) 5 MG tablet Take 2 tablets (10 mg total) by mouth daily. 60 tablet 6  . apixaban (ELIQUIS) 5 MG TABS tablet Take 1 tablet (5 mg total) by mouth 2 (two) times daily.  60 tablet 11  . atorvastatin (LIPITOR) 40 MG tablet Take 1 tablet (40 mg total) by mouth daily. 30 tablet 11  . folic acid (FOLVITE) 1 MG tablet Take 1 tablet (1 mg total) by mouth daily. 30 tablet 2  . furosemide (LASIX) 40 MG tablet Take 1 tablet (40 mg total) by mouth daily. 30 tablet 6  . losartan (COZAAR) 50 MG tablet take 1 tablet by mouth once daily 30 tablet 11  . Multiple Vitamin (MULTIVITAMIN WITH MINERALS) TABS tablet Take 1 tablet by mouth daily.    Marland Kitchen thiamine 100 MG tablet Take 1 tablet (100 mg total) by mouth daily. 30 tablet 2  . metoprolol tartrate (LOPRESSOR) 25 MG tablet Take 1 tablet (25 mg total) by mouth 2 (two) times daily. 180  tablet 3  . amiodarone (PACERONE) 200 MG tablet Take 1 tablet (200 mg total) by mouth as directed. 2 tabs twice daily for 1 week then decrease to 1 tab twice daily (Patient not taking: Reported on 01/18/2016) 180 tablet 3   No facility-administered medications prior to visit.      Allergies:   Review of patient's allergies indicates no known allergies.   Social History   Social History  . Marital Status: Married    Spouse Name: N/A  . Number of Children: N/A  . Years of Education: N/A   Social History Main Topics  . Smoking status: Former Smoker -- 0.25 packs/day for 60 years    Types: Cigarettes    Start date: 09/06/2011  . Smokeless tobacco: Never Used  . Alcohol Use: 0.0 oz/week    0 Standard drinks or equivalent per week     Comment: 09/29/2014 "maybe 3-4 beers/month"  . Drug Use: No  . Sexual Activity: Not Currently   Other Topics Concern  . None   Social History Narrative     Family History:  The patient's family history includes Heart disease in his mother; Heart failure in his father; Stroke in his mother. There is no history of CAD or Heart attack.   ROS:   Please see the history of present illness.    Review of Systems  Cardiovascular: Positive for dyspnea on exertion.   All other systems reviewed and are negative.   Physical Exam:    VS:  BP 110/50 mmHg  Pulse 40  Ht 6\' 2"  (1.88 m)  Wt 198 lb 12.8 oz (90.175 kg)  BMI 25.51 kg/m2  SpO2 97%   Physical Exam  Constitutional: He is oriented to person, place, and time. He appears well-developed and well-nourished.  HENT:  Head: Normocephalic and atraumatic.  Neck: No JVD present.  Cardiovascular: Regular rhythm, S1 normal and S2 normal.  Bradycardia present.   No murmur heard. Pulmonary/Chest: Effort normal and breath sounds normal. He has no wheezes. He has no rales.  Abdominal: Soft. There is no tenderness.  Musculoskeletal: He exhibits no edema.  Neurological: He is alert and oriented to person,  place, and time.  Skin: Skin is warm and dry.  Psychiatric: He has a normal mood and affect.    Wt Readings from Last 3 Encounters:  01/18/16 198 lb 12.8 oz (90.175 kg)  01/01/16 195 lb 1.9 oz (88.506 kg)  12/01/15 197 lb 6.4 oz (89.54 kg)      Studies/Labs Reviewed:     EKG:  EKG is  ordered today.  The ekg ordered today demonstrates sinus brady, HR 40, normal axis, QTc 493 ms  Recent Labs: 06/15/2015: TSH 1.569 10/05/2015: B  Natriuretic Peptide 373.5* 10/15/2015: BUN 17; Creat 1.29*; Hemoglobin 15.1; Platelets 354; Potassium 4.1; Sodium 138   Recent Lipid Panel    Component Value Date/Time   CHOL 122 01/14/2015 1147   TRIG 92.0 01/14/2015 1147   HDL 43.90 01/14/2015 1147   CHOLHDL 3 01/14/2015 1147   VLDL 18.4 01/14/2015 1147   LDLCALC 60 01/14/2015 1147    Additional studies/ records that were reviewed today include:   Echo 12/18/14 EF 50% with mild apical hypokinesis, mild LVH, grade 2 diastolic dysfunction, mild MR, severe BAE, PASP 43 mmHg  LHC 10/01/14 LM: Normal LAD: 20% proximal 20% mid LCx: minor irregularities. RCA: 20% proximal  LVEF: 25 %, there is moderate mitral regurgitation .   Echo 10/01/14 Mild LVH, EF 30-35%, inferior HK, MAC, mild MR, mild LAE, mild RAE, moderate PI  ASSESSMENT:     1. PAF (paroxysmal atrial fibrillation) ([place])   2. Bradycardia   3. NICM (nonischemic cardiomyopathy) ([place])   4. Essential hypertension   5. High risk medication use     PLAN:     In order of problems listed above:  1. PAF - I saw him a few weeks ago with recurrent symptomatic AFib.  His HR was controlled.  I started him on Amiodarone and he is now back in NSR.  He feels much better.  He is now bradycardic.  He is not symptomatic with this.  But, I will adjust his medications to avoid a HR in the 40s.    -  Decrease Amiodarone to 200 mg QD  -  Decrease Metoprolol to 12.5 mg bid  -  RN visit in 1 week >> DC Metoprolol if HR remains < 50.  -  Continue  Eliquis.  2. Bradycardia - As noted, he is asymptomatic.  Adjust Metoprolol and Amiodarone as noted above.   3. NICM (nonischemic cardiomyopathy): EF improved to 50% in normal sinus rhythm. Presumed tachycardia mediated cardiomyopathy. Therefore, maintaining NSR is important.  Continue beta-blocker, Amiodarone, angiotensin receptor blocker.  Given bradycardia, we may need to ultimately stop his metoprolol.   4. Essential hypertension: Blood pressure controlled.  5. High risk medication -  Continue Amiodarone.  Arrange FU LFTs, TSH in 6 weeks.  Arrange baseline PFTs with DLCO.     Medication Adjustments/Labs and Tests Ordered: Current medicines are reviewed at length with the patient today.  Concerns regarding medicines are outlined above.  Medication changes, Labs and Tests ordered today are outlined in the Patient Instructions noted below. Patient Instructions  Medication Instructions:  DECREASE Amiodarone to 200 mg Once daily  DECREASE Metoprolol Tartrate to 25 mg take 1/2 tablet (12.5 mg) Twice daily  Labwork: In 6 weeks - LFTs, TSH Testing/Procedures: Schedule PFTs with DLCO (Pulmonary Function Test) Follow-Up: Richardson Dopp, PA-C in 3 months.  Any Other Special Instructions Will Be Listed Below (If Applicable). Return in 1-2 weeks for a nurse visit to get an ECG. If you need a refill on your cardiac medications before your next appointment, please call your pharmacy.   Signed, Richardson Dopp, PA-C  01/18/2016 1:01 PM    [facility] [address], [place], [place]  [address] Phone: [phone]; Fax: [phone]

## 2016-01-18 ENCOUNTER — Ambulatory Visit (INDEPENDENT_AMBULATORY_CARE_PROVIDER_SITE_OTHER): Payer: Medicare Other | Admitting: Physician Assistant

## 2016-01-18 ENCOUNTER — Encounter: Payer: Self-pay | Admitting: Physician Assistant

## 2016-01-18 VITALS — BP 110/50 | HR 40 | Ht 74.0 in | Wt 198.8 lb

## 2016-01-18 DIAGNOSIS — I10 Essential (primary) hypertension: Secondary | ICD-10-CM | POA: Diagnosis not present

## 2016-01-18 DIAGNOSIS — I480 Paroxysmal atrial fibrillation: Secondary | ICD-10-CM

## 2016-01-18 DIAGNOSIS — R001 Bradycardia, unspecified: Secondary | ICD-10-CM

## 2016-01-18 DIAGNOSIS — I429 Cardiomyopathy, unspecified: Secondary | ICD-10-CM | POA: Diagnosis not present

## 2016-01-18 DIAGNOSIS — Z79899 Other long term (current) drug therapy: Secondary | ICD-10-CM

## 2016-01-18 DIAGNOSIS — I428 Other cardiomyopathies: Secondary | ICD-10-CM

## 2016-01-18 MED ORDER — METOPROLOL TARTRATE 25 MG PO TABS: 12.5000 mg | ORAL_TABLET | Freq: Two times a day (BID) | ORAL | Status: DC

## 2016-01-18 NOTE — Patient Instructions (Addendum)
Medication Instructions:  DECREASE Amiodarone to 200 mg Once daily  DECREASE Metoprolol Tartrate to 25 mg take 1/2 tablet (12.5 mg) Twice daily  Labwork: In 6 weeks - LFTs, TSH Testing/Procedures: Schedule PFTs with DLCO (Pulmonary Function Test) Follow-Up: Richardson Dopp, PA-C in 3 months.  Any Other Special Instructions Will Be Listed Below (If Applicable). Return in 1-2 weeks for a nurse visit to get an ECG. If you need a refill on your cardiac medications before your next appointment, please call your pharmacy.

## 2016-01-20 ENCOUNTER — Ambulatory Visit: Payer: Medicare Other | Admitting: Podiatry

## 2016-02-03 ENCOUNTER — Ambulatory Visit (INDEPENDENT_AMBULATORY_CARE_PROVIDER_SITE_OTHER): Payer: Medicare Other | Admitting: Nurse Practitioner

## 2016-02-03 VITALS — BP 104/62 | HR 42 | Ht 74.0 in | Wt 196.3 lb

## 2016-02-03 DIAGNOSIS — R001 Bradycardia, unspecified: Secondary | ICD-10-CM

## 2016-02-03 NOTE — Patient Instructions (Signed)
Medication Instructions:  STOP Metoprolol   Labwork: None Ordered   Testing/Procedures: None Ordered   Follow-Up: Keep your follow up appointment with Richardson Dopp, PA on 10/10   If you need a refill on your cardiac medications before your next appointment, please call your pharmacy.   Thank you for choosing [facility]! Christen Bame, RN [phone]

## 2016-02-03 NOTE — Progress Notes (Signed)
1.) Reason for visit: EKG/follow up from office visit on 7/11 where Metoprolol was decreased to 12.5 mg bid and amiodarone was decreased to 200 mg daily  2.) Name of MD requesting visit: Richardson Dopp, PA  3.) H&P: Here for follow-up from office visit 7/11 for evaluation of heart rate; hx a fib with RVR and resulting acute systolic HF.  Patient denies complaints  4.) ROS related to problem: patient denies complaints of lethargy, weakness; states he is feeling well  5.) Assessment and plan per MD: sinus bradycardia EKG reviewed by Richardson Dopp, PA wiith Dr. Curt Bears who advised patient d/c metoprolol as planned for HR < 50 bpm and keep appointment for f/u on 10/10 Patient verbalized understanding and agreement with plan and ambulated from office in NAD

## 2016-02-10 ENCOUNTER — Other Ambulatory Visit: Payer: Self-pay | Admitting: Physician Assistant

## 2016-02-11 ENCOUNTER — Telehealth: Payer: Self-pay | Admitting: *Deleted

## 2016-02-29 ENCOUNTER — Encounter (INDEPENDENT_AMBULATORY_CARE_PROVIDER_SITE_OTHER): Payer: Medicare Other | Admitting: Internal Medicine

## 2016-02-29 ENCOUNTER — Other Ambulatory Visit: Payer: Medicare Other | Admitting: *Deleted

## 2016-02-29 ENCOUNTER — Encounter: Payer: Self-pay | Admitting: Physician Assistant

## 2016-02-29 DIAGNOSIS — Z79899 Other long term (current) drug therapy: Secondary | ICD-10-CM

## 2016-02-29 DIAGNOSIS — R001 Bradycardia, unspecified: Secondary | ICD-10-CM

## 2016-02-29 DIAGNOSIS — I480 Paroxysmal atrial fibrillation: Secondary | ICD-10-CM

## 2016-02-29 DIAGNOSIS — I10 Essential (primary) hypertension: Secondary | ICD-10-CM

## 2016-02-29 LAB — HEPATIC FUNCTION PANEL
ALBUMIN: 4.3 g/dL (ref 3.6–5.1)
ALK PHOS: 87 U/L (ref 40–115)
ALT: 13 U/L (ref 9–46)
AST: 14 U/L (ref 10–35)
BILIRUBIN DIRECT: 0.1 mg/dL (ref <=0.2)
BILIRUBIN TOTAL: 0.4 mg/dL (ref 0.2–1.2)
Indirect Bilirubin: 0.3 mg/dL (ref 0.2–1.2)
Total Protein: 7.8 g/dL (ref 6.1–8.1)

## 2016-02-29 LAB — PULMONARY FUNCTION TEST
DL/VA % pred: 78 %
DL/VA: 3.71 ml/min/mmHg/L
DLCO COR: 20.91 ml/min/mmHg
DLCO UNC % PRED: 57 %
DLCO UNC: 20.79 ml/min/mmHg
DLCO cor % pred: 57 %
FEF 25-75 PRE: 3.1 L/s
FEF 25-75 Post: 4.2 L/sec
FEF2575-%Change-Post: 35 %
FEF2575-%PRED-POST: 168 %
FEF2575-%PRED-PRE: 124 %
FEV1-%Change-Post: 4 %
FEV1-%PRED-POST: 93 %
FEV1-%PRED-PRE: 88 %
FEV1-PRE: 3.06 L
FEV1-Post: 3.2 L
FEV1FVC-%Change-Post: 4 %
FEV1FVC-%PRED-PRE: 112 %
FEV6-%CHANGE-POST: 1 %
FEV6-%PRED-POST: 85 %
FEV6-%PRED-PRE: 84 %
FEV6-POST: 3.81 L
FEV6-Pre: 3.76 L
FEV6FVC-%CHANGE-POST: 0 %
FEV6FVC-%PRED-PRE: 105 %
FEV6FVC-%Pred-Post: 106 %
FVC-%Change-Post: 0 %
FVC-%Pred-Post: 80 %
FVC-%Pred-Pre: 80 %
FVC-Post: 3.82 L
FVC-Pre: 3.79 L
POST FEV1/FVC RATIO: 84 %
PRE FEV1/FVC RATIO: 81 %
Post FEV6/FVC ratio: 100 %
Pre FEV6/FVC Ratio: 99 %
RV % PRED: 86 %
RV: 2.37 L
TLC % pred: 83 %
TLC: 6.36 L

## 2016-02-29 LAB — TSH: TSH: 3.03 mIU/L (ref 0.40–4.50)

## 2016-02-29 NOTE — Telephone Encounter (Signed)
x

## 2016-03-02 ENCOUNTER — Telehealth: Payer: Self-pay | Admitting: *Deleted

## 2016-03-02 DIAGNOSIS — R0602 Shortness of breath: Secondary | ICD-10-CM

## 2016-03-02 NOTE — Telephone Encounter (Signed)
Pt notified of PFT results and findings. Pt advised need referral to Pulmonology for further review of PFT; Pt has hx of smoking. Referral placed.

## 2016-03-08 ENCOUNTER — Encounter: Payer: Self-pay | Admitting: Emergency Medicine

## 2016-03-08 ENCOUNTER — Ambulatory Visit (INDEPENDENT_AMBULATORY_CARE_PROVIDER_SITE_OTHER): Payer: Medicare Other | Admitting: Emergency Medicine

## 2016-03-08 DIAGNOSIS — J849 Interstitial pulmonary disease, unspecified: Secondary | ICD-10-CM

## 2016-03-08 DIAGNOSIS — G4733 Obstructive sleep apnea (adult) (pediatric): Secondary | ICD-10-CM

## 2016-03-08 DIAGNOSIS — J449 Chronic obstructive pulmonary disease, unspecified: Secondary | ICD-10-CM | POA: Diagnosis not present

## 2016-03-08 DIAGNOSIS — Z79899 Other long term (current) drug therapy: Secondary | ICD-10-CM | POA: Diagnosis not present

## 2016-03-08 DIAGNOSIS — Z720 Tobacco use: Secondary | ICD-10-CM

## 2016-03-08 NOTE — Addendum Note (Signed)
Addended by: Desmond Dike C on: 03/08/2016 02:30 PM   Modules accepted: Orders

## 2016-03-08 NOTE — Patient Instructions (Signed)
We will not start any inhaled medications at this time We will obtain a high-resolution CT scan of the chest to compare with 2016. We will likely repeat your pulmonary function testing annually while you are using amiodarone Follow with Dr Lamonte Sakai in 12 months or sooner if you have any problems

## 2016-03-08 NOTE — Progress Notes (Signed)
Subjective:    Patient ID: Bruce Cohen, male    DOB: [birth year], 75 y.o.   MRN: [id number]  HPI 75 year old former smoker (40+ pack years), History of coronary artery disease, Hypertension, Nonischemic cardiomyopathy, Pericarditis,  Atrial fibrillation, obstructive sleep apnea. His atrial fibrillation has been labile and he clearly tolerates poorly. He has undergone DCCV most recently in April 2017. He has had dyspnea and decreased function capacity whenever he was in atrial fibrillation with poor rate control. He was started on amiodarone in June 2017, And has felt much better since he has been back in normal sinus rhythm. He underwent pulmonary function testing 02/29/16 after the amiodarone was started. I have reviewed the results. This showed normal airflows, normal lung volumes and a decreased diffusion capacity that corrects almost fully to normal when adjusted for alveolar volume (78% predicted). Chest x-ray from 10/05/15 was reviewed. This shows some mild diffuse interstitial prominence that predates initiation of amiodarone. He has a CT from 09/2014 that showed scattered infiltrates in setting of an acute illness, Treated for pneumonia.   Review of Systems  Constitutional: Negative for fever and unexpected weight change.  HENT: Negative for congestion, dental problem, ear pain, nosebleeds, postnasal drip, rhinorrhea, sinus pressure, sneezing, sore throat and trouble swallowing.   Eyes: Negative for redness and itching.  Respiratory: Positive for shortness of breath. Negative for cough, chest tightness and wheezing.   Cardiovascular: Negative for palpitations and leg swelling.  Gastrointestinal: Negative for nausea and vomiting.  Genitourinary: Negative for dysuria.  Musculoskeletal: Negative for joint swelling.  Skin: Negative for rash.  Neurological: Negative for headaches.  Hematological: Does not bruise/bleed easily.  Psychiatric/Behavioral: Negative for dysphoric mood. The patient  is not nervous/anxious.    Past Medical History:  Diagnosis Date  . CAD (coronary artery disease)    a. LHC 3/16: pLAD 20, mLAD 20, pRCA 20, EF 25%  . Heart murmur   . History of PFTs    a. PFTs 8/17: FEV1 88% predicted; FEV1/FVC 81%, corr DLCO 57%  . Hyperlipidemia   . Hypertension   . LVH (left ventricular hypertrophy)   . Mitral regurgitation   . NICM (nonischemic cardiomyopathy) ([place])    Tachy mediated 2/2 AF with RVR >> a. Echo 3/16: EF 30-35% >> b. Echo 6/16:  EF 50% with mild apical HK, mild LVH, Gr 2 DD, mild MR, severe BAE, PASP 43 mmHg  . OSA (obstructive sleep apnea) 02/12/2015   Mild to moderate with AHI 14.7/hr  . PAF (paroxysmal atrial fibrillation) ([place]) 09/2014   CHADS2-VASc=5 >> Eliquis // 6/17: Amiodarone started  . Pericarditis    a. Hospitalized in [place]. Episode in 1998. Had a cath which was clean (also in 1990s). 2 further episodes that were mild.     Family History  Problem Relation Age of Onset  . Heart disease Mother     VALVE SURGERY  . Heart failure Father   . CAD Neg Hx   . Heart attack Neg Hx   . Stroke Mother      Social History   Social History  . Marital status: Married    Spouse name: N/A  . Number of children: N/A  . Years of education: N/A   Occupational History  . Not on file.   Social History Main Topics  . Smoking status: Former Smoker    Packs/day: 0.25    Years: 60.00    Types: Cigarettes    Quit date: 07/11/1998  . Smokeless tobacco: Never  Used  . Alcohol use 0.0 oz/week     Comment: 09/29/2014 "maybe 3-4 beers/month"  . Drug use: No  . Sexual activity: Not Currently   Other Topics Concern  . Not on file   Social History Narrative  . No narrative on file     No Known Allergies   Outpatient Medications Prior to Visit  Medication Sig Dispense Refill  . amiodarone (PACERONE) 200 MG tablet Take 200 mg by mouth daily.    Marland Kitchen amLODipine (NORVASC) 5 MG tablet take 2 tablets by mouth once daily 180 tablet 3  . apixaban  (ELIQUIS) 5 MG TABS tablet Take 1 tablet (5 mg total) by mouth 2 (two) times daily. 60 tablet 11  . atorvastatin (LIPITOR) 40 MG tablet Take 1 tablet (40 mg total) by mouth daily. 30 tablet 11  . folic acid (FOLVITE) 1 MG tablet Take 1 tablet (1 mg total) by mouth daily. 30 tablet 2  . furosemide (LASIX) 40 MG tablet Take 1 tablet (40 mg total) by mouth daily. 30 tablet 6  . losartan (COZAAR) 50 MG tablet take 1 tablet by mouth once daily 30 tablet 11  . Multiple Vitamin (MULTIVITAMIN WITH MINERALS) TABS tablet Take 1 tablet by mouth daily.    Marland Kitchen thiamine 100 MG tablet Take 1 tablet (100 mg total) by mouth daily. 30 tablet 2   No facility-administered medications prior to visit.          Objective:   Physical Exam  Vitals:   03/08/16 1403  BP: 118/72  Pulse: 60  SpO2: 98%  Weight: 196 lb (88.9 kg)  Height: 6' 2.5" (1.892 m)    Gen: Pleasant, well-nourished, in no distress,  normal affect  ENT: No lesions,  mouth clear,  oropharynx clear, no postnasal drip  Neck: No JVD, no TMG, no carotid bruits  Lungs: No use of accessory muscles, clear without rales or rhonchi  Cardiovascular: RRR, heart sounds normal, no murmur or gallops, no peripheral edema  Musculoskeletal: No deformities, no cyanosis or clubbing  Neuro: alert, non focal  Skin: Warm, no lesions or rashes  09/29/14 --  COMPARISON:  No priors.  FINDINGS: Mediastinum/Lymph Nodes: Heart size is mildly enlarged. There is no significant pericardial fluid, thickening or pericardial calcification. Extensive calcifications of the mitral annulus inferiorly. There is atherosclerosis of the thoracic aorta, the great vessels of the mediastinum and the coronary arteries, including calcified atherosclerotic plaque in the left anterior descending and right coronary arteries. Multiple borderline enlarged and mildly enlarged mediastinal and hilar lymph nodes bilaterally, measuring up to 17 mm in short axis in the low right  paratracheal station. Hilar lymphadenopathy is poorly evaluated on today's non contrast CT examination. Esophagus is unremarkable in appearance. No axillary lymphadenopathy.  Lungs/Pleura: High-resolution images demonstrate patchy areas of ground-glass attenuation, septal thickening and some scattered areas of subpleural reticulation. Study is limited by considerable patient respiratory motion. There is mild diffuse bronchial wall thickening. No definite bronchiectasis. No honeycombing. No clear cut craniocaudal gradient to these findings, with greater extent of ground-glass attenuation in the apices. No confluent consolidative airspace disease. Inspiratory and expiratory imaging is unremarkable. Small bilateral pleural effusions (right greater than left) layering dependently.  Upper Abdomen: 1.9 cm low-attenuation lesion in the lateral aspect of segment 3 of the liver is incompletely characterized, but statistically likely a small cyst.  Musculoskeletal/Soft Tissues: There are no aggressive appearing lytic or blastic lesions noted in the visualized portions of the skeleton.  IMPRESSION: 1. The appearance of  the lungs is not strongly favored to reflect interstitial lung disease. Rather, these findings are favored to reflect interstitial pulmonary edema in the setting of congestive heart failure. Clinical correlation is suggested. If there is persistent clinical concern for interstitial lung disease, repeat high-resolution chest CT could be performed when the patient is not acutely ill and better able to hold his breath in the next 2-3 months. Other differential considerations would include atypical multilobar pneumonia, however, that is not strongly favored. Clinical correlation is suggested. 2. Small bilateral pleural effusions (right greater than left) layering dependently. 3. Mediastinal and bilateral hilar lymphadenopathy is nonspecific. This could be seen in the setting  of congestive failure, infection, and/or interstitial lung disease. Attention at time of repeat high-resolution chest CT is suggested if of clinical concern. 4. Atherosclerosis, including two vessel coronary artery disease. Assessment for potential risk factor modification, dietary therapy or pharmacologic therapy may be warranted, if clinically indicated.       Assessment & Plan:  OSA (obstructive sleep apnea) He was tried on CPAP, but he does not want to wear it. Discontinued it.   COPD (chronic obstructive pulmonary disease) He does have a tobacco history. PFT are reassuring and he denies dyspnea in NSR. I don't see much evidence for COPD. I certainly would not start a b-agonist given the risk for causing tachycardia.   Interstitial lung disease ([place]) Suspected based on a CT chest done March 2016. He was acutely ill at the time and it is not clear to me that the infiltrates noted persist now based on his reassuring physical exam. I would like to repeat a CT scan of his chest to clarify whether he has any baseline underlying ILD especially since we're going to be following him on amiodarone.  High risk medication use Started on amiodarone 12/2015. He has mildly decreased diffusion capacity. He also has a possible history of interstitial disease although this is less clear based on CT scan from 09/2014. I will plan to repeat his pulmonary function testing annually. I will also obtain a CT scan of the chest now to determine whether he has underlying ILD, establish a baseline. No contraindication for continuing amiodarone at this time from my perspective.  Tobacco abuse Now abstinent  Baltazar Apo, MD, PhD 03/08/2016, 2:28 PM [facility] [phone] or if no answer [phone]

## 2016-03-08 NOTE — Assessment & Plan Note (Signed)
Now abstinent

## 2016-03-08 NOTE — Assessment & Plan Note (Signed)
Started on amiodarone 12/2015. He has mildly decreased diffusion capacity. He also has a possible history of interstitial disease although this is less clear based on CT scan from 09/2014. I will plan to repeat his pulmonary function testing annually. I will also obtain a CT scan of the chest now to determine whether he has underlying ILD, establish a baseline. No contraindication for continuing amiodarone at this time from my perspective.

## 2016-03-08 NOTE — Assessment & Plan Note (Signed)
He was tried on CPAP, but he does not want to wear it. Discontinued it.

## 2016-03-08 NOTE — Assessment & Plan Note (Signed)
Suspected based on a CT chest done March 2016. He was acutely ill at the time and it is not clear to me that the infiltrates noted persist now based on his reassuring physical exam. I would like to repeat a CT scan of his chest to clarify whether he has any baseline underlying ILD especially since we're going to be following him on amiodarone.

## 2016-03-08 NOTE — Assessment & Plan Note (Signed)
He does have a tobacco history. PFT are reassuring and he denies dyspnea in NSR. I don't see much evidence for COPD. I certainly would not start a b-agonist given the risk for causing tachycardia.

## 2016-03-17 ENCOUNTER — Ambulatory Visit (INDEPENDENT_AMBULATORY_CARE_PROVIDER_SITE_OTHER)
Admission: RE | Admit: 2016-03-17 | Discharge: 2016-03-17 | Disposition: A | Discharge status: Home / Self Care | Payer: Medicare Other | Source: Ambulatory Visit | Attending: Emergency Medicine | Admitting: Emergency Medicine

## 2016-03-17 DIAGNOSIS — J849 Interstitial pulmonary disease, unspecified: Secondary | ICD-10-CM | POA: Diagnosis not present

## 2016-04-01 ENCOUNTER — Encounter: Payer: Self-pay | Admitting: *Deleted

## 2016-04-19 ENCOUNTER — Ambulatory Visit: Payer: Medicare Other | Admitting: Physician Assistant

## 2016-04-20 ENCOUNTER — Ambulatory Visit: Payer: Medicare Other | Admitting: Physician Assistant

## 2016-04-26 ENCOUNTER — Encounter: Payer: Self-pay | Admitting: Physician Assistant

## 2016-04-26 ENCOUNTER — Encounter (INDEPENDENT_AMBULATORY_CARE_PROVIDER_SITE_OTHER): Payer: Self-pay

## 2016-04-26 ENCOUNTER — Ambulatory Visit (INDEPENDENT_AMBULATORY_CARE_PROVIDER_SITE_OTHER): Payer: Medicare Other | Admitting: Physician Assistant

## 2016-04-26 VITALS — BP 130/78 | HR 66 | Ht 74.5 in | Wt 199.0 lb

## 2016-04-26 DIAGNOSIS — I10 Essential (primary) hypertension: Secondary | ICD-10-CM | POA: Diagnosis not present

## 2016-04-26 DIAGNOSIS — I428 Other cardiomyopathies: Secondary | ICD-10-CM | POA: Diagnosis not present

## 2016-04-26 DIAGNOSIS — I4819 Other persistent atrial fibrillation: Secondary | ICD-10-CM

## 2016-04-26 DIAGNOSIS — I7121 Aneurysm of the ascending aorta, without rupture: Secondary | ICD-10-CM

## 2016-04-26 DIAGNOSIS — I481 Persistent atrial fibrillation: Secondary | ICD-10-CM | POA: Diagnosis not present

## 2016-04-26 DIAGNOSIS — I712 Thoracic aortic aneurysm, without rupture: Secondary | ICD-10-CM | POA: Diagnosis not present

## 2016-04-26 DIAGNOSIS — J849 Interstitial pulmonary disease, unspecified: Secondary | ICD-10-CM

## 2016-04-26 HISTORY — DX: Thoracic aortic aneurysm, without rupture: I71.2

## 2016-04-26 HISTORY — DX: Aneurysm of the ascending aorta, without rupture: I71.21

## 2016-04-26 NOTE — Patient Instructions (Addendum)
Medication Instructions:  No changes.  Continue your current medications.  Labwork: None   Testing/Procedures: None   Follow-Up: Dr. Casandra Doffing or Richardson Dopp, PA-C in 6 months.  Your physician wants you to follow-up in: 6 months.  You will receive a reminder letter in the mail two months in advance. If you don't receive a letter, please call our office to schedule the follow-up appointment.  Any Other Special Instructions Will Be Listed Below (If Applicable).  If you need a refill on your cardiac medications before your next appointment, please call your pharmacy.

## 2016-04-26 NOTE — Progress Notes (Signed)
Cardiology Office Note:    Date:  04/26/2016   ID:  Bruce Cohen, DOB Jul 17, 1940, MRN [id number]  PCP:  Bruce Screws, MD  Cardiologist:  Dr. Casandra Doffing   Electrophysiologist:  n/a Pulmonologist: Dr. Lamonte Cohen  Referring MD: Bruce Huddle, MD   Chief Complaint  Patient presents with  . Follow-up    Atrial fibrillation    History of Present Illness:    Bruce Cohen is a 75 y.o. male with a hx of recurrent pericarditis, HTN, HL. Admitted 09/2014 with acute systolic HF in the setting of AFib with RVR. Troponins were elevated. Echo demonstrated reduced LVF with EF 30-35%. LHC demonstrated mild non-obstructive CAD. NICM was thought to likely be due to tachycardia. There was also concern for ETOH abuse. After an appropriate interval of uninterrupted anticoagulation, he underwent DCCV 5/13 with restoration of NSR. Repeat echocardiogram 12/18/14 demonstrated improved function with an EF of 50%. He underwent repeat DCCV in 4/17 for recurrent AFib.  I saw him in 7/17 with recurrent AFib again and we put him on Amiodarone.  He converted to NSR on Amiodarone.  Baseline PFTs did demonstrate mod reduced DLCO and I had him see Dr. Lamonte Cohen.  High-resolution CT did demonstrate interlobular septal thickening and subpleural reticulation suggestive of residual edema versus interstitial lung disease, 4 cm ascending thoracic aortic aneurysm. Follow-up CT in one year recommended.  Returns for FU.  Here alone. Doing very well.  The patient denies chest pain, shortness of breath, syncope, orthopnea, PND or significant pedal edema.  He is excited about the [employer] playing in the [place].   Prior CV studies that were reviewed today include:    Echo 12/18/14 EF 50% with mild apical hypokinesis, mild LVH, grade 2 diastolic dysfunction, mild MR, severe BAE, PASP 43 mmHg  LHC 10/01/14 LM: Normal LAD: 20% proximal 20% mid LCx: minor irregularities. RCA: 20% proximal  LVEF: 25 %, there is  moderate mitral regurgitation .   Echo 10/01/14 Mild LVH, EF 30-35%, inferior HK, MAC, mild MR, mild LAE, mild RAE, moderate PI  Past Medical History:  Diagnosis Date  . CAD (coronary artery disease)    a. LHC 3/16: pLAD 20, mLAD 20, pRCA 20, EF 25%  . Heart murmur   . History of PFTs    a. PFTs 8/17: FEV1 88% predicted; FEV1/FVC 81%, corr DLCO 57%  . Hyperlipidemia   . Hypertension   . LVH (left ventricular hypertrophy)   . Mitral regurgitation   . NICM (nonischemic cardiomyopathy) ([place])    Tachy mediated 2/2 AF with RVR >> a. Echo 3/16: EF 30-35% >> b. Echo 6/16:  EF 50% with mild apical HK, mild LVH, Gr 2 DD, mild MR, severe BAE, PASP 43 mmHg  . OSA (obstructive sleep apnea) 02/12/2015   Mild to moderate with AHI 14.7/hr  . PAF (paroxysmal atrial fibrillation) ([place]) 09/2014   CHADS2-VASc=5 >> Eliquis // 6/17: Amiodarone started  . Pericarditis    a. Hospitalized in [place]. Episode in 1998. Had a cath which was clean (also in 1990s). 2 further episodes that were mild.  . Thoracic ascending aortic aneurysm ([place]) 04/26/2016   A. High res CT 9/17: 4.0 cm ascending thoracic aortic aneurysm - needs FU annual CT or MRA    Past Surgical History:  Procedure Laterality Date  . APPENDECTOMY    . BACK SURGERY    . CARDIAC CATHETERIZATION  1998   "clean"  . CARDIOVERSION N/A 11/21/2014   Procedure: CARDIOVERSION;  Surgeon: Dorris Carnes  V, MD;  Location: [facility];  Service: Cardiovascular;  Laterality: N/A;  . CARDIOVERSION N/A 10/22/2015   Procedure: CARDIOVERSION;  Surgeon: Herminio Commons, MD;  Location: [facility] ENDOSCOPY;  Service: Cardiovascular;  Laterality: N/A;  . FRACTURE SURGERY    . LEFT HEART CATHETERIZATION WITH CORONARY ANGIOGRAM N/A 10/01/2014   Procedure: LEFT HEART CATHETERIZATION WITH CORONARY ANGIOGRAM;  Surgeon: Wellington Hampshire, MD;  Location: [facility] CATH LAB;  Service: Cardiovascular;  Laterality: N/A;  . LUMBAR LAMINECTOMY  1970's   "partial"  . ORIF FINGER / THUMB  FRACTURE Left ~ 2006   "thumb"  . TONSILLECTOMY      Current Medications: Current Meds  Medication Sig  . amiodarone (PACERONE) 200 MG tablet Take 200 mg by mouth daily.  Bruce Cohen amLODipine (NORVASC) 5 MG tablet take 2 tablets by mouth once daily  . apixaban (ELIQUIS) 5 MG TABS tablet Take 1 tablet (5 mg total) by mouth 2 (two) times daily.  Bruce Cohen atorvastatin (LIPITOR) 40 MG tablet Take 1 tablet (40 mg total) by mouth daily.  . folic acid (FOLVITE) 1 MG tablet Take 1 tablet (1 mg total) by mouth daily.  . furosemide (LASIX) 40 MG tablet Take 1 tablet (40 mg total) by mouth daily.  Bruce Cohen losartan (COZAAR) 50 MG tablet take 1 tablet by mouth once daily  . Multiple Vitamin (MULTIVITAMIN WITH MINERALS) TABS tablet Take 1 tablet by mouth daily.  Bruce Cohen thiamine 100 MG tablet Take 1 tablet (100 mg total) by mouth daily.     Allergies:   Review of patient's allergies indicates no known allergies.   Social History   Social History  . Marital status: Married    Spouse name: N/A  . Number of children: N/A  . Years of education: N/A   Social History Main Topics  . Smoking status: Former Smoker    Packs/day: 0.25    Years: 60.00    Types: Cigarettes    Quit date: 07/11/1998  . Smokeless tobacco: Never Used  . Alcohol use 0.0 oz/week     Comment: 09/29/2014 "maybe 3-4 beers/month"  . Drug use: No  . Sexual activity: Not Currently   Other Topics Concern  . None   Social History Narrative  . None     Family History:  The patient's family history includes Heart disease in his mother; Heart failure in his father; Stroke in his mother.   ROS:   Please see the history of present illness.    ROS All other systems reviewed and are negative.   EKGs/Labs/Other Test Reviewed:    EKG:  EKG is  ordered today.  The ekg ordered today demonstrates NSR, HR 66, normal axis, QTc 482 ms, nonspecific ST-T wave changes  Recent Labs: 10/05/2015: B Natriuretic Peptide 373.5 10/15/2015: BUN 17; Creat 1.29; Hemoglobin  15.1; Platelets 354; Potassium 4.1; Sodium 138 02/29/2016: ALT 13; TSH 3.03   Recent Lipid Panel    Component Value Date/Time   CHOL 122 01/14/2015 1147   TRIG 92.0 01/14/2015 1147   HDL 43.90 01/14/2015 1147   CHOLHDL 3 01/14/2015 1147   VLDL 18.4 01/14/2015 1147   LDLCALC 60 01/14/2015 1147   High Resolution Chest CT 03/18/16 IMPRESSION: 1. Resolved areas of consolidation. Interlobular septal thickening and subpleural reticulation throughout both lungs, slightly less prominent. Given the cardiomegaly, findings are favored to represent residual mild pulmonary edema. Interstitial lung disease such as nonspecific interstitial pneumonia (NSIP) is difficult to exclude. Follow-up high-resolution chest CT in 12 months would be useful  to assess temporal pattern stability. 2. Mild mediastinal lymphadenopathy, decreased, consistent with benign reactive adenopathy. 3. Aortic atherosclerosis. Stable 4.0 cm ascending thoracic aortic aneurysm. Recommend annual imaging followup by CTA or MRA. This recommendation follows 2010 ACCF/AHA/AATS/ACR/ASA/SCA/SCAI/SIR/STS/SVM Guidelines for the Diagnosis and Management of Patients with Thoracic Aortic Disease. Circulation. 2010; 121: [id number]. 4. One vessel coronary atherosclerosis.  Physical Exam:    VS:  BP 130/78   Pulse 66   Ht 6' 2.5" (1.892 m)   Wt 199 lb (90.3 kg)   SpO2 99%   BMI 25.21 kg/m     Wt Readings from Last 3 Encounters:  04/26/16 199 lb (90.3 kg)  03/08/16 196 lb (88.9 kg)  02/03/16 196 lb 4.8 oz (89 kg)     Physical Exam  Constitutional: He is oriented to person, place, and time. He appears well-developed and well-nourished. No distress.  HENT:  Head: Normocephalic and atraumatic.  Eyes: No scleral icterus.  Neck: No JVD present.  Cardiovascular: Normal rate, regular rhythm and normal heart sounds.   No murmur heard. Pulmonary/Chest: He has no wheezes. He has no rales.  Abdominal: There is no tenderness.    Musculoskeletal: He exhibits no edema.  Neurological: He is alert and oriented to person, place, and time.  Skin: Skin is warm and dry.  Psychiatric: He has a normal mood and affect.    ASSESSMENT:    1. Persistent atrial fibrillation ([place])   2. NICM (nonischemic cardiomyopathy) ([place])   3. Thoracic ascending aortic aneurysm ([place])   4. Essential hypertension   5. ILD (interstitial lung disease) ([place])    PLAN:    In order of problems listed above:  1. Persistent AF - Maintaining NSR on Amiodarone.  HR is much better off of Toprol.  Labs done by PCP 04/21/16: TSH 1.440, ALT 9, SCr 1.33, HCT 42.5.  Continue current dose of Amiodarone, Eliquis.     2. NICM (nonischemic cardiomyopathy):  EF improved to 50% in normal sinus rhythm. Presumed tachycardia mediated cardiomyopathy.  Therefore, maintaining NSR is important.  Continue Amiodarone, angiotensin receptor blocker.  He is no longer on a beta blocker due to bradycardia.  3. Ascending thoracic aortic aneurysm - This was noted on Chest CT done for evaluation of possible ILD.  He will need FU CT arranged in 1 year.   4. Essential hypertension:  Blood pressure well controlled.   5. ILD - He saw Dr. Lamonte Cohen and it is felt that Amiodarone is ok to continue for now.  He has decreased DLCO on recent PFTs.  He will continue FU with Dr. Lamonte Cohen.      Medication Adjustments/Labs and Tests Ordered: Current medicines are reviewed at length with the patient today.  Concerns regarding medicines are outlined above.  Medication changes, Labs and Tests ordered today are outlined in the Patient Instructions noted below. Patient Instructions  Medication Instructions:  No changes.  Continue your current medications.  Labwork: None   Testing/Procedures: None   Follow-Up: Dr. Casandra Doffing or Richardson Dopp, PA-C in 6 months.  Your physician wants you to follow-up in: 6 months.  You will receive a reminder letter in the mail two months in advance. If you  don't receive a letter, please call our office to schedule the follow-up appointment.  Any Other Special Instructions Will Be Listed Below (If Applicable).  If you need a refill on your cardiac medications before your next appointment, please call your pharmacy.   Signed, Richardson Dopp, PA-C  04/26/2016 1:23 PM  [facility] [address], [place], [place]  [address] Phone: [phone]; Fax: [phone]

## 2016-05-21 ENCOUNTER — Other Ambulatory Visit: Payer: Self-pay | Admitting: Interventional Cardiology

## 2016-11-15 ENCOUNTER — Encounter: Payer: Self-pay | Admitting: Interventional Cardiology

## 2016-12-01 ENCOUNTER — Ambulatory Visit: Payer: Medicare Other | Admitting: Interventional Cardiology

## 2016-12-12 ENCOUNTER — Ambulatory Visit (INDEPENDENT_AMBULATORY_CARE_PROVIDER_SITE_OTHER): Payer: Medicare Other | Admitting: Physician Assistant

## 2016-12-12 ENCOUNTER — Encounter (INDEPENDENT_AMBULATORY_CARE_PROVIDER_SITE_OTHER): Payer: Self-pay

## 2016-12-12 ENCOUNTER — Encounter: Payer: Self-pay | Admitting: Physician Assistant

## 2016-12-12 VITALS — BP 120/50 | HR 50 | Ht 74.5 in | Wt 201.4 lb

## 2016-12-12 DIAGNOSIS — I481 Persistent atrial fibrillation: Secondary | ICD-10-CM

## 2016-12-12 DIAGNOSIS — I428 Other cardiomyopathies: Secondary | ICD-10-CM

## 2016-12-12 DIAGNOSIS — I10 Essential (primary) hypertension: Secondary | ICD-10-CM

## 2016-12-12 DIAGNOSIS — I7121 Aneurysm of the ascending aorta, without rupture: Secondary | ICD-10-CM

## 2016-12-12 DIAGNOSIS — I2510 Atherosclerotic heart disease of native coronary artery without angina pectoris: Secondary | ICD-10-CM | POA: Diagnosis not present

## 2016-12-12 DIAGNOSIS — I712 Thoracic aortic aneurysm, without rupture: Secondary | ICD-10-CM

## 2016-12-12 DIAGNOSIS — I4819 Other persistent atrial fibrillation: Secondary | ICD-10-CM

## 2016-12-12 NOTE — Progress Notes (Signed)
Cardiology Office Note:    Date:  12/12/2016   ID:  [name], DOB [birth year], MRN [id number]  PCP:  Josetta Huddle, MD  Cardiologist:  Dr. Casandra Doffing   Electrophysiologist:  n/a Pulmonologist: Dr. Lamonte Sakai  Referring MD: Josetta Huddle, MD   Chief Complaint  Patient presents with  . Follow-up    AFib, Cardiomyopathy    History of Present Illness:    Bruce Cohen is a 76 y.o. male with a hx of recurrent pericarditis, persistent atrial fibrillation, nonischemic cardiomyopathy secondary to tachycardia, thoracic aortic aneurysm, HTN, HL.   He was admitted in 09/2014 with acute systolic HF in the setting of AFib with RVR. EF was 30-35%. LHC demonstrated mild non-obstructive CAD. NICM was thought to likely be due to tachycardia. There was also concern for ETOH abuse. He underwent DCCV 5/13 and again in 4/17.  A repeat echocardiogram 12/18/14 demonstrated improved function with an EF of 50%.  In 7/17, he developed recurrent AFib and he was started on Amiodarone.  He converted to NSR.  Baseline PFTs did demonstrate mod reduced DLCO and I had him see Dr. Lamonte Sakai.  High-resolution CT did demonstrate interlobular septal thickening and subpleural reticulation suggestive of residual edema versus interstitial lung disease, 4 cm ascending thoracic aortic aneurysm. Follow-up CT in one year recommended.  He was last seen in Cardiology clinic in 04/2016.    Mr. Tolan returns for Cardiology follow up.  He is here alone.  He has no complaints.  He denies chest pain, shortness of breath, syncope, orthopnea, PND or significant pedal edema.  He denies dizziness, fatigue, near syncope.    Prior CV studies:   The following studies were reviewed today:  Echo 12/18/14 EF 50% with mild apical hypokinesis, mild LVH, grade 2 diastolic dysfunction, mild MR, severe BAE, PASP 43 mmHg   LHC 10/01/14 LM:  Normal LAD:   20% proximal 20% mid LCx:   minor irregularities. RCA:  20% proximal   LVEF:  25 %, there  is moderate mitral regurgitation .    Echo 10/01/14 Mild LVH, EF 30-35%, inferior HK, MAC, mild MR, mild LAE, mild RAE, moderate PI  High Resolution Chest CT 03/18/16 IMPRESSION: 1. Resolved areas of consolidation. Interlobular septal thickening and subpleural reticulation throughout both lungs, slightly less prominent. Given the cardiomegaly, findings are favored to represent residual mild pulmonary edema. Interstitial lung disease such as nonspecific interstitial pneumonia (NSIP) is difficult to exclude. Follow-up high-resolution chest CT in 12 months would be useful to assess temporal pattern stability. 2. Mild mediastinal lymphadenopathy, decreased, consistent with benign reactive adenopathy. 3. Aortic atherosclerosis. Stable 4.0 cm ascending thoracic aortic aneurysm. Recommend annual imaging followup by CTA or MRA. This recommendation follows 2010 ACCF/AHA/AATS/ACR/ASA/SCA/SCAI/SIR/STS/SVM Guidelines for the Diagnosis and Management of Patients with Thoracic Aortic Disease. Circulation. 2010; 121: [id number]. 4. One vessel coronary atherosclerosis.  Past Medical History:  Diagnosis Date  . CAD (coronary artery disease)    a. LHC 3/16: pLAD 20, mLAD 20, pRCA 20, EF 25%  . Heart murmur   . History of PFTs    a. PFTs 8/17: FEV1 88% predicted; FEV1/FVC 81%, corr DLCO 57%  . Hyperlipidemia   . Hypertension   . LVH (left ventricular hypertrophy)   . Mitral regurgitation   . NICM (nonischemic cardiomyopathy) ([place])    Tachy mediated 2/2 AF with RVR >> a. Echo 3/16: EF 30-35% >> b. Echo 6/16:  EF 50% with mild apical HK, mild LVH, Gr 2 DD, mild MR, severe BAE,  PASP 43 mmHg  . OSA (obstructive sleep apnea) 02/12/2015   Mild to moderate with AHI 14.7/hr  . PAF (paroxysmal atrial fibrillation) ([place]) 09/2014   CHADS2-VASc=5 >> Eliquis // 6/17: Amiodarone started  . Pericarditis    a. Hospitalized in [place]. Episode in 1998. Had a cath which was clean (also in 1990s). 2 further episodes  that were mild.  . Thoracic ascending aortic aneurysm ([place]) 04/26/2016   A. High res CT 9/17: 4.0 cm ascending thoracic aortic aneurysm - needs FU annual CT or MRA    Past Surgical History:  Procedure Laterality Date  . APPENDECTOMY    . BACK SURGERY    . CARDIAC CATHETERIZATION  1998   "clean"  . CARDIOVERSION N/A 11/21/2014   Procedure: CARDIOVERSION;  Surgeon: Fay Records, MD;  Location: [facility] ENDOSCOPY;  Service: Cardiovascular;  Laterality: N/A;  . CARDIOVERSION N/A 10/22/2015   Procedure: CARDIOVERSION;  Surgeon: Herminio Commons, MD;  Location: [facility];  Service: Cardiovascular;  Laterality: N/A;  . FRACTURE SURGERY    . LEFT HEART CATHETERIZATION WITH CORONARY ANGIOGRAM N/A 10/01/2014   Procedure: LEFT HEART CATHETERIZATION WITH CORONARY ANGIOGRAM;  Surgeon: Wellington Hampshire, MD;  Location: [facility] CATH LAB;  Service: Cardiovascular;  Laterality: N/A;  . LUMBAR LAMINECTOMY  1970's   "partial"  . ORIF FINGER / THUMB FRACTURE Left ~ 2006   "thumb"  . TONSILLECTOMY      Current Medications: Current Meds  Medication Sig  . amiodarone (PACERONE) 200 MG tablet Take 200 mg by mouth as directed. 200 MG  EVERY  DAY  EXCEPT   1/2 TAB   (100 MG ) SAT  AND  SUN  . amLODipine (NORVASC) 5 MG tablet take 2 tablets by mouth once daily  . apixaban (ELIQUIS) 5 MG TABS tablet Take 1 tablet (5 mg total) by mouth 2 (two) times daily.  Marland Kitchen atorvastatin (LIPITOR) 40 MG tablet Take 1 tablet (40 mg total) by mouth daily.  . folic acid (FOLVITE) 1 MG tablet Take 1 tablet (1 mg total) by mouth daily.  . furosemide (LASIX) 40 MG tablet take 1 tablet by mouth once daily  . losartan (COZAAR) 50 MG tablet take 1 tablet by mouth once daily  . Multiple Vitamin (MULTIVITAMIN WITH MINERALS) TABS tablet Take 1 tablet by mouth daily.  Marland Kitchen thiamine 100 MG tablet Take 1 tablet (100 mg total) by mouth daily.     Allergies:   Patient has no known allergies.   Social History   Social History  . Marital status:  Married    Spouse name: N/A  . Number of children: N/A  . Years of education: N/A   Social History Main Topics  . Smoking status: Former Smoker    Packs/day: 0.25    Years: 60.00    Types: Cigarettes    Quit date: 07/11/1998  . Smokeless tobacco: Never Used  . Alcohol use 0.0 oz/week     Comment: 09/29/2014 "maybe 3-4 beers/month"  . Drug use: No  . Sexual activity: Not Currently   Other Topics Concern  . None   Social History Narrative  . None     Family Hx: The patient's family history includes Heart disease in his mother; Heart failure in his father; Stroke in his mother. There is no history of CAD or Heart attack.  ROS:   Please see the history of present illness.    ROS All other systems reviewed and are negative.   EKGs/Labs/Other Test Reviewed:  EKG:  EKG is  ordered today.  The ekg ordered today demonstrates sinus brady, HR 50, normal axis, QTc 479 ms, no changes.   Recent Labs: 02/29/2016: ALT 13; TSH 3.03   Recent Lipid Panel Lab Results  Component Value Date/Time   CHOL 122 01/14/2015 11:47 AM   TRIG 92.0 01/14/2015 11:47 AM   HDL 43.90 01/14/2015 11:47 AM   CHOLHDL 3 01/14/2015 11:47 AM   LDLCALC 60 01/14/2015 11:47 AM    Physical Exam:    VS:  BP (!) 120/50   Pulse (!) 50   Ht 6' 2.5" (1.892 m)   Wt 201 lb 6.4 oz (91.4 kg)   BMI 25.51 kg/m     Wt Readings from Last 3 Encounters:  12/12/16 201 lb 6.4 oz (91.4 kg)  04/26/16 199 lb (90.3 kg)  03/08/16 196 lb (88.9 kg)     Physical Exam  Constitutional: He is oriented to person, place, and time. He appears well-developed and well-nourished. No distress.  HENT:  Head: Normocephalic and atraumatic.  Eyes: No scleral icterus.  Neck: Normal range of motion. No JVD present.  Cardiovascular: Regular rhythm, S1 normal and S2 normal.  Bradycardia present.   No murmur heard. Pulmonary/Chest: Effort normal. He has decreased breath sounds. He has no wheezes. He has no rhonchi. He has no rales.    Abdominal: Soft. There is no tenderness.  Musculoskeletal: He exhibits no edema.  Neurological: He is alert and oriented to person, place, and time.  Skin: Skin is warm and dry.  Psychiatric: He has a normal mood and affect.    ASSESSMENT:    1. Persistent atrial fibrillation ([place])   2. NICM (nonischemic cardiomyopathy) ([place])   3. Thoracic ascending aortic aneurysm ([place])   4. Coronary artery disease involving native coronary artery of native heart without angina pectoris   5. Essential hypertension    PLAN:    In order of problems listed above:  1. Persistent atrial fibrillation ([place]) -  He continues to maintain normal sinus rhythm on amiodarone. His heart rate is somewhat low. He is not symptomatic. He has been on amiodarone for almost 1 year. I will decrease his dose to 200 mg daily except take 100 mg on Saturdays and Sundays. Follow-up LFTs and TSH will be obtained today. He sees an eye doctor on a yearly basis. As he continues on anticoagulation with Eliquis, I will also obtain BMET, CBC today.  2. NICM (nonischemic cardiomyopathy) ([place]) - Last echo 6/16 with improved EF of 50%. Continue ARB. He could not tolerate beta blockers secondary to bradycardia.  3. Thoracic ascending aortic aneurysm ([facility]) - 4.0 cm ascending thoracic aortic aneurysm by CT 9/17. Follow-up CT due 9/18.  4. Coronary artery disease involving native coronary artery of native heart without angina pectoris - Mild nonobstructive disease by cardiac catheterization 3/16. He is not on aspirin as he is on Eliquis. Continue statin.  5. Essential hypertension - The patient's blood pressure is controlled on his current regimen.  Continue current therapy.     Dispo:  Return in about 6 months (around 06/13/2017) for Routine Follow Up, w/ Dr. Irish Lack or Richardson Dopp, PA-C.   Medication Adjustments/Labs and Tests Ordered: Current medicines are reviewed at length with the patient today.  Concerns regarding medicines  are outlined above.  Orders/Tests:  Orders Placed This Encounter  Procedures  . CBC with Differential/Platelet  . Basic metabolic panel  . Hepatic function panel  . TSH  . EKG 12-Lead  Medication changes: No orders of the defined types were placed in this encounter.  Signed, Richardson Dopp, PA-C  12/12/2016 5:30 PM    [facility] [address], [place], [place]  [address] Phone: [phone]; Fax: [phone]

## 2016-12-12 NOTE — Patient Instructions (Signed)
Your physician has recommended you make the following change in your medication:  AMIODARONE    200 MG  EVERY DAY  EXCEPT   100 MG  ON  SAT  AND  SUN   Your physician recommends that you return for lab work in:  [place]  TSH AND LIVER   Your physician wants you to follow-up in: [place] will receive a reminder letter in the mail two months in advance. If you don't receive a letter, please call our office to schedule the follow-up appointment.  Non-Cardiac CT Angiography (CTA), is a special type of CT scan that uses a computer to produce multi-dimensional views of major blood vessels throughout the body. In CT angiography, a contrast material is injected through an IV to help visualize the blood vessels  THIS  IS  DUE   ANYTIME    AFTER  03-18-17  FOLLOW UP  ON THORACIC  ANEURYSM

## 2016-12-13 ENCOUNTER — Telehealth: Payer: Self-pay | Admitting: *Deleted

## 2016-12-13 ENCOUNTER — Other Ambulatory Visit: Payer: Self-pay

## 2016-12-13 DIAGNOSIS — I712 Thoracic aortic aneurysm, without rupture: Secondary | ICD-10-CM

## 2016-12-13 DIAGNOSIS — I7121 Aneurysm of the ascending aorta, without rupture: Secondary | ICD-10-CM

## 2016-12-13 LAB — HEPATIC FUNCTION PANEL
ALK PHOS: 100 IU/L (ref 39–117)
ALT: 15 IU/L (ref 0–44)
AST: 14 IU/L (ref 0–40)
Albumin: 4.3 g/dL (ref 3.5–4.8)
BILIRUBIN TOTAL: 0.4 mg/dL (ref 0.0–1.2)
BILIRUBIN, DIRECT: 0.16 mg/dL (ref 0.00–0.40)
Total Protein: 7.6 g/dL (ref 6.0–8.5)

## 2016-12-13 LAB — CBC WITH DIFFERENTIAL/PLATELET
BASOS: 0 %
Basophils Absolute: 0 10*3/uL (ref 0.0–0.2)
EOS (ABSOLUTE): 0.1 10*3/uL (ref 0.0–0.4)
EOS: 1 %
HEMATOCRIT: 42.6 % (ref 37.5–51.0)
HEMOGLOBIN: 14.5 g/dL (ref 13.0–17.7)
Immature Grans (Abs): 0.1 10*3/uL (ref 0.0–0.1)
Immature Granulocytes: 1 %
LYMPHS ABS: 1.6 10*3/uL (ref 0.7–3.1)
Lymphs: 13 %
MCH: 32.8 pg (ref 26.6–33.0)
MCHC: 34 g/dL (ref 31.5–35.7)
MCV: 96 fL (ref 79–97)
MONOS ABS: 0.9 10*3/uL (ref 0.1–0.9)
Monocytes: 7 %
NEUTROS ABS: 9.3 10*3/uL — AB (ref 1.4–7.0)
Neutrophils: 78 %
PLATELETS: 370 10*3/uL (ref 150–379)
RBC: 4.42 x10E6/uL (ref 4.14–5.80)
RDW: 14.3 % (ref 12.3–15.4)
WBC: 11.9 10*3/uL — ABNORMAL HIGH (ref 3.4–10.8)

## 2016-12-13 LAB — TSH: TSH: 1.43 u[IU]/mL (ref 0.450–4.500)

## 2016-12-13 LAB — BASIC METABOLIC PANEL
BUN/Creatinine Ratio: 13 (ref 10–24)
BUN: 17 mg/dL (ref 8–27)
CALCIUM: 9.2 mg/dL (ref 8.6–10.2)
CHLORIDE: 98 mmol/L (ref 96–106)
CO2: 22 mmol/L (ref 18–29)
CREATININE: 1.35 mg/dL — AB (ref 0.76–1.27)
GFR calc non Af Amer: 51 mL/min/{1.73_m2} — ABNORMAL LOW (ref >59)
GFR, EST AFRICAN AMERICAN: 59 mL/min/{1.73_m2} — AB (ref >59)
Glucose: 97 mg/dL (ref 65–99)
Potassium: 4 mmol/L (ref 3.5–5.2)
Sodium: 138 mmol/L (ref 134–144)

## 2016-12-13 NOTE — Telephone Encounter (Signed)
Pt has been notified of lab results by phone with verbal understanding. Pt aware WBC still mildly elevated. I will fax a copy of results to Dr. Inda Merlin PCP. Pt thanked me for my call today.

## 2016-12-13 NOTE — Telephone Encounter (Signed)
-----   Message from Liliane Shi, [place] sent at 12/13/2016  4:51 PM EDT ----- Please call the patient Kidney function is stable The hemoglobin, TSH, LFTs are normal. White blood cell count is mildly elevated but stable from 4/17 >> send copy to PCP. Continue with current treatment plan. Richardson Dopp, PA-C   12/13/2016 4:51 PM

## 2017-01-29 ENCOUNTER — Other Ambulatory Visit: Payer: Self-pay | Admitting: Interventional Cardiology

## 2017-02-08 ENCOUNTER — Other Ambulatory Visit: Payer: Self-pay | Admitting: Physician Assistant

## 2017-03-14 ENCOUNTER — Other Ambulatory Visit: Payer: Medicare Other

## 2017-03-14 ENCOUNTER — Other Ambulatory Visit: Payer: Self-pay

## 2017-03-14 DIAGNOSIS — I10 Essential (primary) hypertension: Secondary | ICD-10-CM

## 2017-03-16 ENCOUNTER — Other Ambulatory Visit: Payer: Medicare Other | Admitting: *Deleted

## 2017-03-16 DIAGNOSIS — I10 Essential (primary) hypertension: Secondary | ICD-10-CM

## 2017-03-17 ENCOUNTER — Telehealth: Payer: Self-pay | Admitting: *Deleted

## 2017-03-17 LAB — BASIC METABOLIC PANEL
BUN/Creatinine Ratio: 15 (ref 10–24)
BUN: 17 mg/dL (ref 8–27)
CALCIUM: 9.4 mg/dL (ref 8.6–10.2)
CO2: 23 mmol/L (ref 20–29)
CREATININE: 1.16 mg/dL (ref 0.76–1.27)
Chloride: 101 mmol/L (ref 96–106)
GFR calc Af Amer: 70 mL/min/{1.73_m2} (ref >59)
GFR, EST NON AFRICAN AMERICAN: 61 mL/min/{1.73_m2} (ref >59)
Glucose: 97 mg/dL (ref 65–99)
POTASSIUM: 3.7 mmol/L (ref 3.5–5.2)
Sodium: 140 mmol/L (ref 134–144)

## 2017-03-17 NOTE — Telephone Encounter (Signed)
Pt has been notified of lab results by phone with verbal understanding. Pt thanked me for my call today.   

## 2017-03-17 NOTE — Telephone Encounter (Signed)
-----   Message from Liliane Shi, [place] sent at 03/17/2017  8:40 AM EDT ----- Please call patient: The kidney function (BUN, Creatinine) and potassium are normal. All other parameters are within acceptable limits and no further intervention or testing required. Continue with current treatment plan. Richardson Dopp, PA-C    03/17/2017 8:40 AM

## 2017-03-20 ENCOUNTER — Ambulatory Visit (INDEPENDENT_AMBULATORY_CARE_PROVIDER_SITE_OTHER)
Admission: RE | Admit: 2017-03-20 | Discharge: 2017-03-20 | Disposition: A | Discharge status: Home / Self Care | Payer: Medicare Other | Source: Ambulatory Visit | Attending: Physician Assistant | Admitting: Physician Assistant

## 2017-03-20 ENCOUNTER — Encounter: Payer: Self-pay | Admitting: Physician Assistant

## 2017-03-20 DIAGNOSIS — I712 Thoracic aortic aneurysm, without rupture: Secondary | ICD-10-CM | POA: Diagnosis not present

## 2017-03-20 DIAGNOSIS — I7121 Aneurysm of the ascending aorta, without rupture: Secondary | ICD-10-CM

## 2017-03-20 MED ORDER — IOPAMIDOL (ISOVUE-370) INJECTION 76%: 100.0000 mL | Freq: Once | INTRAVENOUS | Status: DC | PRN

## 2017-03-21 ENCOUNTER — Telehealth: Payer: Self-pay | Admitting: *Deleted

## 2017-03-21 DIAGNOSIS — I7121 Aneurysm of the ascending aorta, without rupture: Secondary | ICD-10-CM

## 2017-03-21 DIAGNOSIS — I712 Thoracic aortic aneurysm, without rupture, unspecified: Secondary | ICD-10-CM

## 2017-03-21 NOTE — Telephone Encounter (Signed)
-----   Message from Liliane Shi, [place] sent at 03/20/2017  4:09 PM EDT ----- Please call the patient. The CT shows that the ascending aortic aneurysm is stable compared with study last year (4 cm in 2017 >> 3.9 cm on this study) Please repeat Chest CTA in 1 year.  Please fax a copy of this study result to his PCP:  Josetta Huddle, MD  Thanks! Richardson Dopp, PA-C    03/20/2017 4:06 PM

## 2017-03-21 NOTE — Telephone Encounter (Signed)
Pt has been notified of Chest CT-A with stable findings. Pt agreeable to repeat CT in 1 year. Pt thanked me for my call today.

## 2017-04-29 ENCOUNTER — Other Ambulatory Visit: Payer: Self-pay | Admitting: Cardiology

## 2017-04-29 MED ORDER — AMIODARONE HCL 200 MG PO TABS: 200.0000 mg | ORAL_TABLET | ORAL | 0 refills | Status: DC

## 2017-06-05 ENCOUNTER — Other Ambulatory Visit: Payer: Self-pay

## 2017-06-05 MED ORDER — FUROSEMIDE 40 MG PO TABS: 40.0000 mg | ORAL_TABLET | Freq: Every day | ORAL | 5 refills | Status: DC

## 2017-06-11 NOTE — Progress Notes (Signed)
Cardiology Office Note   Date:  06/13/2017   ID:  Bruce Cohen, DOB 08/16/1940, MRN [id number]  PCP:  Bruce Huddle, MD    No chief complaint on file.  AFib  Wt Readings from Last 3 Encounters:  06/13/17 202 lb 9.6 oz (91.9 kg)  12/12/16 201 lb 6.4 oz (91.4 kg)  04/26/16 199 lb (90.3 kg)       History of Present Illness: Bruce Cohen is a 76 y.o. male  with a hx of recurrent pericarditis, persistent atrial fibrillation, nonischemic cardiomyopathy secondary to tachycardia, thoracic aortic aneurysm, HTN, HL.   He was admitted in 09/2014 with acute systolic HF in the setting of AFib with RVR. EF was 30-35%. LHC demonstrated mild non-obstructive CAD. NICM was thought to likely be due to tachycardia. There was also concern for ETOH abuse. He underwent DCCV 5/13 and again in 4/17.  A repeat echocardiogram 12/18/14 demonstrated improved function with an EF of 50%.  In 7/17, he developed recurrent AFib and he was started on Amiodarone. He converted to NSR.  Baseline PFTs did demonstrate mod reduced DLCO and I had him see Dr. Lamonte Sakai. High-resolution CT did demonstrate interlobular septal thickening and subpleural reticulation suggestive of residual edema versus interstitial lung disease, 4 cm ascending thoracic aortic aneurysm. Follow-up CT in one year recommended.   Sx of AFib is SHOB, not palpitations.   Since the last visit, Denies : Chest pain. Dizziness. Leg edema. Nitroglycerin use. Orthopnea. Palpitations. Paroxysmal nocturnal dyspnea. Shortness of breath. Syncope.   He does not do much walking.    No bleeding issues.     Past Medical History:  Diagnosis Date  . CAD (coronary artery disease)    a. LHC 3/16: pLAD 20, mLAD 20, pRCA 20, EF 25%  . Heart murmur   . History of PFTs    a. PFTs 8/17: FEV1 88% predicted; FEV1/FVC 81%, corr DLCO 57%  . Hyperlipidemia   . Hypertension   . LVH (left ventricular hypertrophy)   . Mitral regurgitation   . NICM  (nonischemic cardiomyopathy) ([place])    Tachy mediated 2/2 AF with RVR >> a. Echo 3/16: EF 30-35% >> b. Echo 6/16:  EF 50% with mild apical HK, mild LVH, Gr 2 DD, mild MR, severe BAE, PASP 43 mmHg  . OSA (obstructive sleep apnea) 02/12/2015   Mild to moderate with AHI 14.7/hr  . PAF (paroxysmal atrial fibrillation) ([place]) 09/2014   CHADS2-VASc=5 >> Eliquis // 6/17: Amiodarone started  . Pericarditis    a. Hospitalized in [place]. Episode in 1998. Had a cath which was clean (also in 1990s). 2 further episodes that were mild.  . Thoracic ascending aortic aneurysm ([place]) 04/26/2016   High res CT 9/17: 4.0 cm ascending thoracic aortic aneurysm // Chest CTA 9/18: ascending aorta 3.9 cm, CAD, benign thyroid nodules, stable lung reticular changes, aortic atherosclerosis    Past Surgical History:  Procedure Laterality Date  . APPENDECTOMY    . BACK SURGERY    . CARDIAC CATHETERIZATION  1998   "clean"  . CARDIOVERSION N/A 11/21/2014   Procedure: CARDIOVERSION;  Surgeon: Fay Records, MD;  Location: [facility];  Service: Cardiovascular;  Laterality: N/A;  . CARDIOVERSION N/A 10/22/2015   Procedure: CARDIOVERSION;  Surgeon: Herminio Commons, MD;  Location: [facility];  Service: Cardiovascular;  Laterality: N/A;  . FRACTURE SURGERY    . LEFT HEART CATHETERIZATION WITH CORONARY ANGIOGRAM N/A 10/01/2014   Procedure: LEFT HEART CATHETERIZATION WITH CORONARY ANGIOGRAM;  Surgeon: Wellington Hampshire, MD;  Location: [facility] C[facility] LAB;  Service: Cardiovascular;  Laterality: N/A;  . LUMBAR LAMINECTOMY  1970's   "partial"  . ORIF FINGER / THUMB FRACTURE Left ~ 2006   "thumb"  . TONSILLECTOMY       Current Outpatient Medications  Medication Sig Dispense Refill  . amiodarone (PACERONE) 200 MG tablet Take 1 tablet (200 mg total) by mouth as directed. 200 MG  EVERY  DAY  EXCEPT   1/2 TAB   (100 MG ) SAT  AND  SUN 180 tablet 0  . amLODipine (NORVASC) 5 MG tablet take 2 tablets by mouth once daily 180 tablet 3  .  apixaban (ELIQUIS) 5 MG TABS tablet Take 1 tablet (5 mg total) by mouth 2 (two) times daily. 60 tablet 11  . atorvastatin (LIPITOR) 40 MG tablet Take 1 tablet (40 mg total) by mouth daily. 30 tablet 11  . folic acid (FOLVITE) 1 MG tablet Take 1 tablet (1 mg total) by mouth daily. 30 tablet 2  . furosemide (LASIX) 40 MG tablet Take 1 tablet (40 mg total) by mouth daily. 30 tablet 5  . losartan (COZAAR) 50 MG tablet take 1 tablet by mouth once daily 30 tablet 11  . Multiple Vitamin (MULTIVITAMIN WITH MINERALS) TABS tablet Take 1 tablet by mouth daily.    Marland Kitchen thiamine 100 MG tablet Take 1 tablet (100 mg total) by mouth daily. 30 tablet 2   No current facility-administered medications for this visit.     Allergies:   Patient has no known allergies.    Social History:  The patient  reports that he quit smoking about 18 years ago. His smoking use included cigarettes. He has a 15.00 pack-year smoking history. he has never used smokeless tobacco. He reports that he drinks alcohol. He reports that he does not use drugs.   Family History:  The patient's family history includes Heart disease in his mother; Heart failure in his father; Stroke in his mother.    ROS:  Please see the history of present illness.   Otherwise, review of systems are positive for occasional LE edema.   All other systems are reviewed and negative.    PHYSICAL EXAM: VS:  BP 112/62   Pulse 73   Ht 6\' 2"  (1.88 m)   Wt 202 lb 9.6 oz (91.9 kg)   SpO2 99%   BMI 26.01 kg/m  , BMI Body mass index is 26.01 kg/m. GEN: Well nourished, well developed, in no acute distress  HEENT: normal  Neck: no JVD, carotid bruits, or masses Cardiac: RRR; no murmurs, rubs, or gallops,; mild leg edema bilaterally Respiratory:  clear to auscultation bilaterally, normal work of breathing GI: soft, nontender, nondistended, + BS MS: no deformity or atrophy  Skin: warm and dry, no rash Neuro:  Strength and sensation are intact Psych: euthymic mood,  full affect    Recent Labs: 12/12/2016: ALT 15; Hemoglobin 14.5; Platelets 370; TSH 1.430 03/16/2017: BUN 17; Creatinine, Ser 1.16; Potassium 3.7; Sodium 140   Lipid Panel    Component Value Date/Time   CHOL 122 01/14/2015 1147   TRIG 92.0 01/14/2015 1147   HDL 43.90 01/14/2015 1147   CHOLHDL 3 01/14/2015 1147   VLDL 18.4 01/14/2015 1147   LDLCALC 60 01/14/2015 1147     Other studies Reviewed: Additional studies/ records that were reviewed today with results demonstrating: 2016 echo.   ASSESSMENT AND PLAN:  1. Persistent AFIb: In NSR.  Eliquis for stroke prevention.  2. NICM: EF up to 50% by 2016 echo.  3. CAD: No anginal sx.  Mild CAD by 2016 cath. 4. Thoracic aortic aneurysm: size stable by CT in 03/2017. 5. Hypertenson: Well controlled.   6. Pericarditis: No chest pain.    Current medicines are reviewed at length with the patient today.  The patient concerns regarding his medicines were addressed.  The following changes have been made:  No change  Labs/ tests ordered today include:  No orders of the defined types were placed in this encounter.   Recommend 150 minutes/week of aerobic exercise Low fat, low carb, high fiber diet recommended  Disposition:   FU in 1 year; 6 months with Richardson Dopp   Signed, Larae Grooms, MD  06/13/2017 1:37 PM    [facility] [address], [place], [place]  [address] Phone: [phone]; Fax: [phone]

## 2017-06-13 ENCOUNTER — Ambulatory Visit: Payer: Medicare Other | Admitting: Interventional Cardiology

## 2017-06-13 ENCOUNTER — Encounter: Payer: Self-pay | Admitting: Interventional Cardiology

## 2017-06-13 ENCOUNTER — Encounter (INDEPENDENT_AMBULATORY_CARE_PROVIDER_SITE_OTHER): Payer: Self-pay

## 2017-06-13 VITALS — BP 112/62 | HR 73 | Ht 74.0 in | Wt 202.6 lb

## 2017-06-13 DIAGNOSIS — I481 Persistent atrial fibrillation: Secondary | ICD-10-CM | POA: Diagnosis not present

## 2017-06-13 DIAGNOSIS — I712 Thoracic aortic aneurysm, without rupture, unspecified: Secondary | ICD-10-CM

## 2017-06-13 DIAGNOSIS — I2510 Atherosclerotic heart disease of native coronary artery without angina pectoris: Secondary | ICD-10-CM | POA: Diagnosis not present

## 2017-06-13 DIAGNOSIS — I319 Disease of pericardium, unspecified: Secondary | ICD-10-CM

## 2017-06-13 DIAGNOSIS — I10 Essential (primary) hypertension: Secondary | ICD-10-CM

## 2017-06-13 DIAGNOSIS — I4819 Other persistent atrial fibrillation: Secondary | ICD-10-CM

## 2017-06-13 DIAGNOSIS — I428 Other cardiomyopathies: Secondary | ICD-10-CM

## 2017-06-13 NOTE — Patient Instructions (Signed)
Medication Instructions:  Your physician recommends that you continue on your current medications as directed. Please refer to the Current Medication list given to you today.   Labwork: NONE  Testing/Procedures: NONE  Follow-Up: Your physician wants you to follow-up in: [place], [place]. You will receive a reminder letter in the mail two months in advance. If you don't receive a letter, please call our office to schedule the follow-up appointment.  Your physician wants you to follow-up in: [place] DR. VARANASI.  You will receive a reminder letter in the mail two months in advance. If you don't receive a letter, please call our office to schedule the follow-up appointment.   Any Other Special Instructions Will Be Listed Below (If Applicable).     If you need a refill on your cardiac medications before your next appointment, please call your pharmacy.

## 2017-11-06 ENCOUNTER — Other Ambulatory Visit: Payer: Self-pay | Admitting: Cardiology

## 2017-11-08 NOTE — Telephone Encounter (Signed)
Completed.

## 2017-11-08 NOTE — Telephone Encounter (Signed)
Okay to change ordering provider to Dr Irish Lack? Please advise. Thanks, MI

## 2017-12-01 ENCOUNTER — Other Ambulatory Visit: Payer: Self-pay | Admitting: Interventional Cardiology

## 2017-12-13 ENCOUNTER — Encounter: Payer: Self-pay | Admitting: Physician Assistant

## 2017-12-13 ENCOUNTER — Ambulatory Visit (INDEPENDENT_AMBULATORY_CARE_PROVIDER_SITE_OTHER): Payer: Medicare Other | Admitting: Physician Assistant

## 2017-12-13 VITALS — BP 124/68 | HR 70 | Ht 74.5 in | Wt 206.4 lb

## 2017-12-13 DIAGNOSIS — I712 Thoracic aortic aneurysm, without rupture, unspecified: Secondary | ICD-10-CM

## 2017-12-13 DIAGNOSIS — I481 Persistent atrial fibrillation: Secondary | ICD-10-CM

## 2017-12-13 DIAGNOSIS — I428 Other cardiomyopathies: Secondary | ICD-10-CM

## 2017-12-13 DIAGNOSIS — J849 Interstitial pulmonary disease, unspecified: Secondary | ICD-10-CM

## 2017-12-13 DIAGNOSIS — I2510 Atherosclerotic heart disease of native coronary artery without angina pectoris: Secondary | ICD-10-CM | POA: Diagnosis not present

## 2017-12-13 DIAGNOSIS — I4819 Other persistent atrial fibrillation: Secondary | ICD-10-CM

## 2017-12-13 DIAGNOSIS — I10 Essential (primary) hypertension: Secondary | ICD-10-CM | POA: Diagnosis not present

## 2017-12-13 NOTE — Progress Notes (Signed)
Cardiology Office Note:    Date:  12/13/2017   ID:  Bruce Cohen, DOB September 27, 1940, MRN [id number]  PCP:  Josetta Huddle, MD  Cardiologist:  Larae Grooms, MD   Referring MD: Josetta Huddle, MD   Chief Complaint  Patient presents with  . Follow-up    A. fib, CHF    History of Present Illness:    Bruce Cohen is a 77 y.o. male with recurrent pericarditis, persistent atrial fibrillation, nonischemic cardiomyopathy secondary to tachycardia, thoracic aortic aneurysm, HTN, HL.  He was admitted in 09/2014 with acute systolic HF in the setting of AFib with RVR. EF was 30-35%. LHC demonstrated mild non-obstructive CAD. NICM was thought to likely be due to tachycardia. There was also concern for ETOH abuse. He underwent DCCV 5/13 and again in 4/17.  A repeat echocardiogram 12/18/14 demonstrated improved function with an EF of 50%.  In 7/17, he developed recurrent AFib and he was started on Amiodarone. He converted to NSR.  Baseline PFTs did demonstrate mod reduced DLCO and I had him see Dr. Lamonte Sakai. High-resolution CT did demonstrate interlobular septal thickening and subpleural reticulation suggestive of residual edema versus interstitial lung disease, 4 cm ascending thoracic aortic aneurysm.  CT in September 2018 demonstrated stable ascending aorta.  Last seen by Dr. Irish Lack 06/2017.    Bruce Cohen returns for follow-up.  He is here alone.  He is doing well.  He denies chest pain, shortness of breath, PND, leg swelling or syncope.  Prior CV studies:   The following studies were reviewed today:  Chest CTA 03/20/17 IMPRESSION: 1. Stable appearance of ascending aorta, measuring upper limits normal by today's exam. Consider annual CT or MRI.  3.9 cm 2. Coronary artery disease. 3. Benign thyroid nodules. 4. Stable appearance of reticular changes in the lungs. 5.  Aortic atherosclerosis.  (ICD10-I70.0)  Echo 12/18/14 EF 50% with mild apical hypokinesis, mild LVH, grade 2 diastolic  dysfunction, mild MR, severe BAE, PASP 43 mmHg  LHC 10/01/14 NT:IRWERX LAD:20% proximal 20% mid VQM:GQQPY irregularities. RCA:20% proximal LVEF:25 %, there is moderate mitral regurgitation .   Echo 10/01/14 Mild LVH, EF 30-35%, inferior HK, MAC, mild MR, mild LAE, mild RAE, moderate PI  High Resolution Chest CT 03/18/16 IMPRESSION: 1. Resolved areas of consolidation. Interlobular septal thickening and subpleural reticulation throughout both lungs, slightly less prominent. Given the cardiomegaly, findings are favored to represent residual mild pulmonary edema. Interstitial lung disease such as nonspecific interstitial pneumonia (NSIP) is difficult to exclude. Follow-up high-resolution chest CT in 12 months would be useful to assess temporal pattern stability. 2. Mild mediastinal lymphadenopathy, decreased, consistent with benign reactive adenopathy. 3. Aortic atherosclerosis. Stable 4.0 cm ascending thoracic aortic aneurysm. Recommend annual imaging followup by CTA or MRA. This recommendation follows 2010 ACCF/AHA/AATS/ACR/ASA/SCA/SCAI/SIR/STS/SVM Guidelines for the Diagnosis and Management of Patients with Thoracic Aortic Disease. Circulation. 2010; 121: P950-D326. 4. One vessel coronary atherosclerosis.   Past Medical History:  Diagnosis Date  . CAD (coronary artery disease)    a. LHC 3/16: pLAD 20, mLAD 20, pRCA 20, EF 25%  . Heart murmur   . History of PFTs    a. PFTs 8/17: FEV1 88% predicted; FEV1/FVC 81%, corr DLCO 57%  . Hyperlipidemia   . Hypertension   . LVH (left ventricular hypertrophy)   . Mitral regurgitation   . NICM (nonischemic cardiomyopathy) ([place])    Tachy mediated 2/2 AF with RVR >> a. Echo 3/16: EF 30-35% >> b. Echo 6/16:  EF 50% with mild apical HK,  mild LVH, Gr 2 DD, mild MR, severe BAE, PASP 43 mmHg  . OSA (obstructive sleep apnea) 02/12/2015   Mild to moderate with AHI 14.7/hr  . PAF (paroxysmal atrial fibrillation) ([place]) 09/2014    CHADS2-VASc=5 >> Eliquis // 6/17: Amiodarone started  . Pericarditis    a. Hospitalized in [place]. Episode in 1998. Had a cath which was clean (also in 1990s). 2 further episodes that were mild.  . Thoracic ascending aortic aneurysm ([place]) 04/26/2016   High res CT 9/17: 4.0 cm ascending thoracic aortic aneurysm // Chest CTA 9/18: ascending aorta 3.9 cm, CAD, benign thyroid nodules, stable lung reticular changes, aortic atherosclerosis   Surgical Hx: The patient  has a past surgical history that includes Cardiac catheterization (1998); Tonsillectomy; Appendectomy; Back surgery; Lumbar laminectomy (1970's); ORIF finger / thumb fracture (Left, ~ 2006); Fracture surgery; left heart catheterization with coronary angiogram (N/A, 10/01/2014); Cardioversion (N/A, 11/21/2014); and Cardioversion (N/A, 10/22/2015).   Current Medications: Current Meds  Medication Sig  . amiodarone (PACERONE) 200 MG tablet TAKE 1 TABLET EVERY DAY AND 1/2 TABLET ON SATURDAY AND SUNDAY  . amLODipine (NORVASC) 5 MG tablet take 2 tablets by mouth once daily  . apixaban (ELIQUIS) 5 MG TABS tablet Take 1 tablet (5 mg total) by mouth 2 (two) times daily.  Marland Kitchen atorvastatin (LIPITOR) 40 MG tablet Take 1 tablet (40 mg total) by mouth daily.  . folic acid (FOLVITE) 1 MG tablet Take 1 tablet (1 mg total) by mouth daily.  . furosemide (LASIX) 40 MG tablet Take 1 tablet (40 mg total) by mouth daily.  Marland Kitchen losartan (COZAAR) 50 MG tablet take 1 tablet by mouth once daily  . Multiple Vitamin (MULTIVITAMIN WITH MINERALS) TABS tablet Take 1 tablet by mouth daily.  Marland Kitchen thiamine 100 MG tablet Take 1 tablet (100 mg total) by mouth daily.     Allergies:   Patient has no known allergies.   Social History   Tobacco Use  . Smoking status: Former Smoker    Packs/day: 0.25    Years: 60.00    Pack years: 15.00    Types: Cigarettes    Last attempt to quit: 07/11/1998    Years since quitting: 19.4  . Smokeless tobacco: Never Used  Substance Use Topics   . Alcohol use: Yes    Alcohol/week: 0.0 oz    Comment: 09/29/2014 "maybe 3-4 beers/month"  . Drug use: No     Family Hx: The patient's family history includes Heart disease in his mother; Heart failure in his father; Stroke in his mother. There is no history of CAD or Heart attack.  ROS:   Please see the history of present illness.    ROS All other systems reviewed and are negative.   EKGs/Labs/Other Test Reviewed:    EKG:  EKG is  ordered today.  The ekg ordered today demonstrates normal sinus rhythm, heart rate 70, normal axis, QTC 494, similar to prior tracings  Recent Labs: 03/16/2017: BUN 17; Creatinine, Ser 1.16; Potassium 3.7; Sodium 140   From KPN Tool: Cholesterol, total  176.000  11/23/2017 HDL    38.000  11/23/2017 LDL    94.000  11/23/2017 Triglycerides  222.000  11/23/2017 Hemoglobin   14.100  11/23/2017 Creatinine, Serum  1.350   11/23/2017 Potassium   3.800   11/23/2017 ALT (SGPT)   11.000  11/23/2017 TSH    1.260   11/23/2017  Recent Lipid Panel Lab Results  Component Value Date/Time   CHOL 122 01/14/2015 11:47 AM  TRIG 92.0 01/14/2015 11:47 AM   HDL 43.90 01/14/2015 11:47 AM   CHOLHDL 3 01/14/2015 11:47 AM   LDLCALC 60 01/14/2015 11:47 AM    Physical Exam:    VS:  BP 124/68   Pulse 70   Ht 6' 2.5" (1.892 m)   Wt 206 lb 6.4 oz (93.6 kg)   SpO2 96%   BMI 26.15 kg/m     Wt Readings from Last 3 Encounters:  12/13/17 206 lb 6.4 oz (93.6 kg)  06/13/17 202 lb 9.6 oz (91.9 kg)  12/12/16 201 lb 6.4 oz (91.4 kg)     Physical Exam  Constitutional: He is oriented to person, place, and time. He appears well-developed and well-nourished. No distress.  HENT:  Head: Normocephalic and atraumatic.  Neck: No JVD present. Carotid bruit is not present.  Cardiovascular: Normal rate, regular rhythm and normal heart sounds.  No murmur heard. Pulmonary/Chest: Effort normal and breath sounds normal. He has no rales.  Abdominal: Soft. There is no hepatomegaly.    Musculoskeletal: He exhibits no edema.  Neurological: He is alert and oriented to person, place, and time.  Skin: Skin is warm and dry.    ASSESSMENT & PLAN:    Persistent atrial fibrillation (HCC) Maintaining normal sinus rhythm on amiodarone.  He is currently tolerating anticoagulation with Apixaban.  Recent TSH and LFTs normal.  NICM (nonischemic cardiomyopathy) (HCC) Reduced ejection fraction was noted previously in the setting of atrial fibrillation with rapid ventricular rate.  His ejection fraction has improved to normal.  He has not been able to tolerate beta-blocker secondary to bradycardia.  Continue ARB.  Coronary artery disease involving native coronary artery of native heart without angina pectoris Mild nonobstructive coronary artery disease by cardiac catheterization in 2016.  He is not on aspirin as he is on Apixaban.  Continue statin.  Recent LDL optimal.  Thoracic aortic aneurysm without rupture ([place]) Stable by most recent CT in September 2018.  Follow-up planned in September 2019.  Essential hypertension The patient's blood pressure is controlled on his current regimen.  Continue current therapy.   ILD (interstitial lung disease) ([place]) I will follow-up with Dr. Lamonte Sakai to see if Bruce Cohen needs a follow-up PFT.   Dispo:  Return in about 6 months (around 06/14/2018) for Routine Follow Up w/ Dr. Irish Lack .   Medication Adjustments/Labs and Tests Ordered: Current medicines are reviewed at length with the patient today.  Concerns regarding medicines are outlined above.  Tests Ordered: Orders Placed This Encounter  Procedures  . EKG 12-Lead   Medication Changes: No orders of the defined types were placed in this encounter.   Signed, Richardson Dopp, PA-C  12/13/2017 2:09 PM    [facility] [place], [place], [place]  [address] Phone: [phone]; Fax: [phone]

## 2017-12-13 NOTE — Patient Instructions (Signed)
Medication Instructions:  1. Your physician recommends that you continue on your current medications as directed. Please refer to the Current Medication list given to you today.   Labwork: NONE ORDERED TODAY   Testing/Procedures: NONE ORDERED TODAY  Follow-Up: Your physician wants you to follow-up in: [place] DR. VARANASI  You will receive a reminder letter in the mail two months in advance. If you don't receive a letter, please call our office to schedule the follow-up appointment.   Any Other Special Instructions Will Be Listed Below (If Applicable).     If you need a refill on your cardiac medications before your next appointment, please call your pharmacy.

## 2017-12-27 ENCOUNTER — Telehealth: Payer: Self-pay | Admitting: Physician Assistant

## 2017-12-27 NOTE — Telephone Encounter (Signed)
Carol Please contact Dr. Agustina Caroli office to see if he needs follow up.  Dr. Agustina Caroli last note indicated the patient would need yearly PFTs.  He has not been seen since 2017.  Thanks Richardson Dopp, PA-C    12/27/2017 5:07 PM

## 2017-12-29 ENCOUNTER — Telehealth: Payer: Self-pay | Admitting: Emergency Medicine

## 2017-12-29 NOTE — Telephone Encounter (Signed)
No number was provided for [facility] with Dr. Kathleen Argue office.  The number found on google stated the number had been disconnected.  It appears that pt is past due for yearly ROV per last OV note.  lmtcb x1 for pt to schedule OV and PFT.

## 2017-12-29 NOTE — Telephone Encounter (Signed)
Per Richardson Dopp, PA I called Dr. Agustina Caroli office, [facility] in regards to if pt needs appt with Pulmonology. Per PA Dr. Esmond Plants note states pt needs yrly PFT's. PA would like to know if pt is needing an appt with Pulmonology first sine he has not been seen since 2017. Dr. Agustina Caroli office will contact the pt and d/w Dr. Lamonte Sakai as well in regards to appt.

## 2018-01-01 NOTE — Telephone Encounter (Signed)
Dr. Kathleen Argue note was found in [place].   LMTCB x2. If patient calls back, please schedule him with RB with PFTs. Thanks!

## 2018-01-02 NOTE — Telephone Encounter (Signed)
Message will be routed back to triage per triage protocol. Triage will need to follow up on this message.

## 2018-01-02 NOTE — Telephone Encounter (Signed)
Attempted to call patient today regarding scheduling appt for PFT and OV with RB. Pt was not available at time of call to schedule any appts. Pt advised that he will call the office back after 3pm today to schedule these appts.  Routing message to [place] to follow up with patient later this week.

## 2018-01-03 NOTE — Telephone Encounter (Signed)
ATC pt, no answer. Left message for pt to call back.  

## 2018-01-04 NOTE — Telephone Encounter (Signed)
LMOM TCB x3 Per office protocol, message to be closed HOWEVER, because pt needs follow up will mail letter to patient's home address on file Staff message also sent to Richardson Dopp PA-C w/ cardiology Pt needs follow up and PFT with Dr Lamonte Sakai

## 2018-01-05 ENCOUNTER — Telehealth: Payer: Self-pay | Admitting: *Deleted

## 2018-01-05 NOTE — Telephone Encounter (Signed)
Attempted to call patient today regarding f/u ov with RB. Per triage protocol; a letter was mailed to pt yesterday to contact our office. I did not receive an answer at time of call. I have left a voicemail message for pt to return call. X1  Routing message to [place] to f/u with patient later next week.

## 2018-01-05 NOTE — Telephone Encounter (Signed)
Bruce Dopp, PA received message from Dr. Agustina Caroli office that they have tried to reach the pt to make appt, see previous phone note. PA messaged me to see if there are any other contacts for the pt. There is a DPR on file ok to s/w pt's wife. I have lmom on both pt's wife phone [phone] as well as pt's ph# [phone]. I have lmtcb to let [facility] know he got the message and that he will need to call [facility] Dr. Agustina Caroli office [phone] to schedule appt.

## 2018-01-05 NOTE — Telephone Encounter (Signed)
-----   Message from Liliane Shi, [place] sent at 01/04/2018  2:41 PM EDT ----- Arbie Cookey - Do we have any other contacts for him? Thanks Event organiser   ----- Message ----- From: Rinaldo Ratel, CMA Sent: 01/04/2018  11:28 AM To: Liliane Shi, PA-C  Good morning Sir, We have attempted to contact [name] several times to schedule a follow up visit with Dr Lamonte Sakai unsuccessfully.  Per office protocol, we are closing our triage message.  I have also mailed a letter to the home address we have on file.  Thank you,  Pulmonary Triage

## 2018-01-08 NOTE — Telephone Encounter (Signed)
DPR ok to s/w pt's wife. Pt's wife called back in regards to message I left the other day about needing an appt with Dr. Agustina Caroli office to schedule he PFT's, see previous notes. Pt's wife states pt said he is not going to do the test. She states if he does not want to do something he is not going to do it, regardless. She said she cannot make him do something he refuses to do. I stated I will let Richardson Dopp, PA and Dr. Lamonte Sakai know if this call today.

## 2018-01-08 NOTE — Telephone Encounter (Signed)
Ok. Richardson Dopp, PA-C    01/08/2018 4:26 PM

## 2018-02-04 ENCOUNTER — Other Ambulatory Visit: Payer: Self-pay | Admitting: Interventional Cardiology

## 2018-02-27 ENCOUNTER — Other Ambulatory Visit: Payer: Self-pay | Admitting: Physician Assistant

## 2018-05-22 ENCOUNTER — Other Ambulatory Visit: Payer: Self-pay | Admitting: Interventional Cardiology

## 2018-07-20 IMAGING — CT CT ANGIO CHEST
2 of 7 series · 18 of 46 positions shown · IV contrast (isovue)
Comparison: 03/17/2016

CLINICAL DATA: F/u thoracic aneurysm seen on prior imaging. Pt
denies any complaints today. 711cc isovue 370

EXAM:
CT ANGIOGRAPHY CHEST WITH CONTRAST
TECHNIQUE: Multidetector CT imaging of the chest was performed using the
standard protocol during bolus administration of intravenous
contrast. Multiplanar CT image reconstructions and MIPs were
obtained to evaluate the vascular anatomy.
CONTRAST:  100 cc Isovue 370

[Series 4: aorta 3.0 i31f 2 · axial · 0.82mm/px · z∈[+1072,+1390]mm · 15 of 116 slices shown]
[im 5/116  lung]
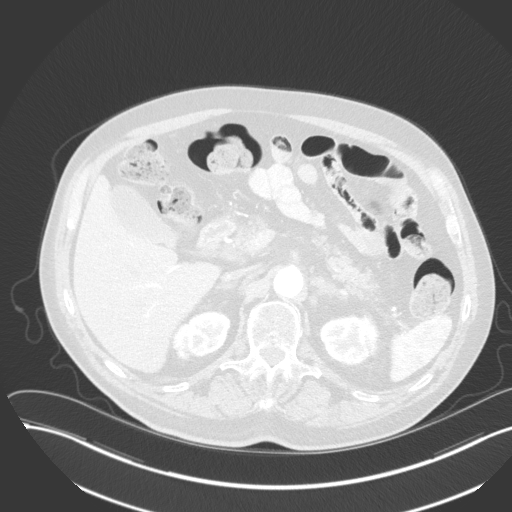
[im 13/116  soft-tissue]
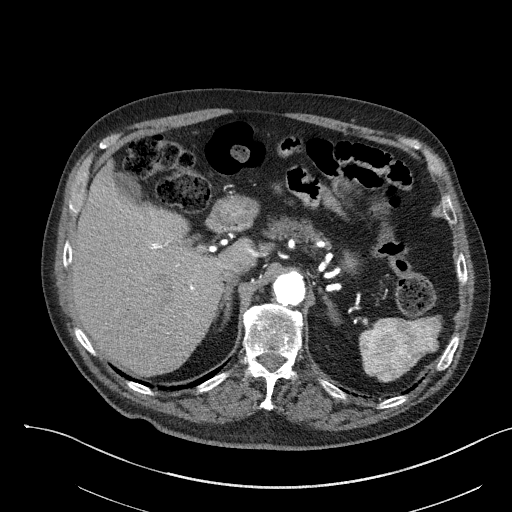
[im 22/116  lung]
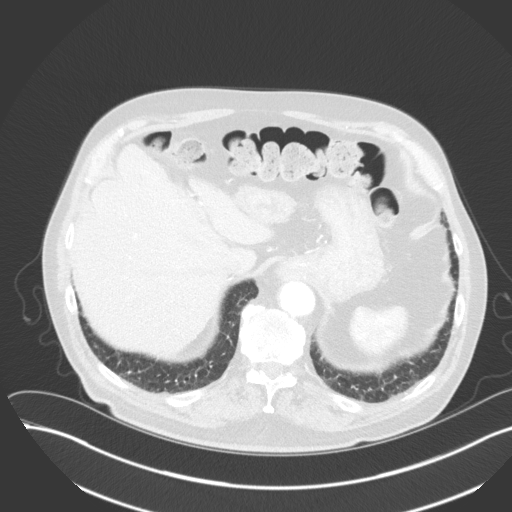
[im 30/116  soft-tissue]
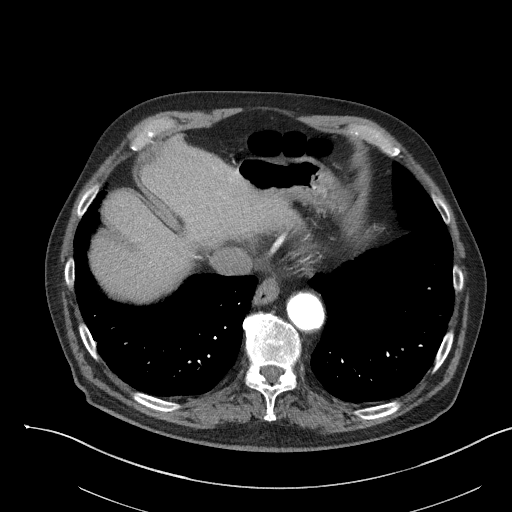
[im 35/116  lung]
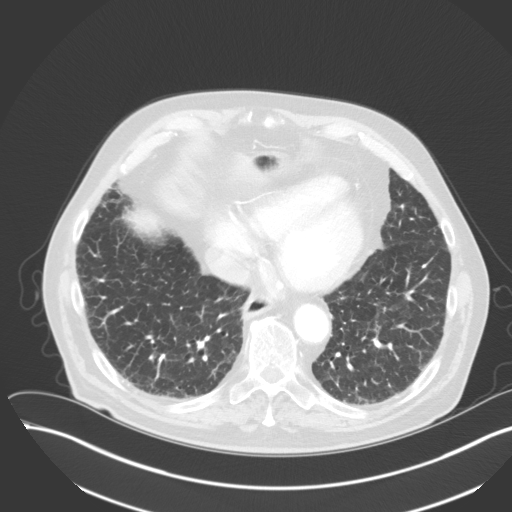
[im 43/116  soft-tissue]
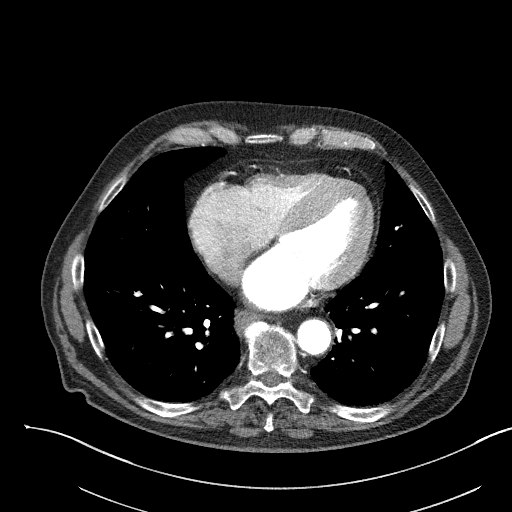
[im 52/116  lung]
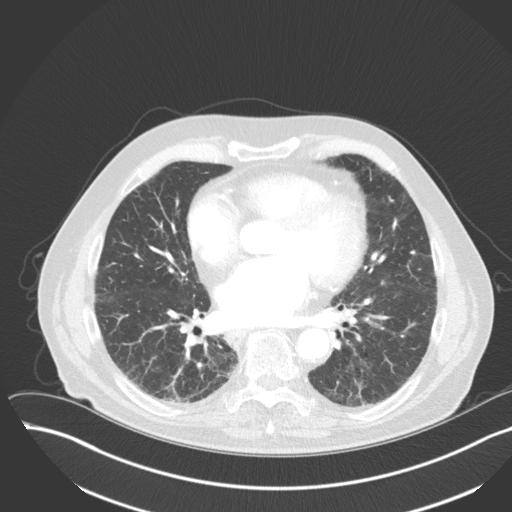
[im 60/116  soft-tissue]
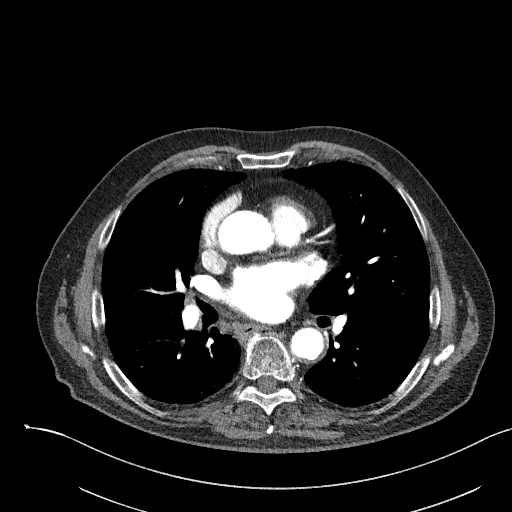
[im 64/116  lung]
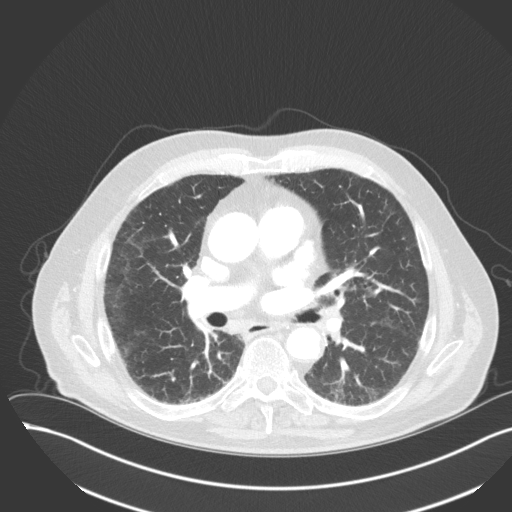
[im 73/116  soft-tissue]
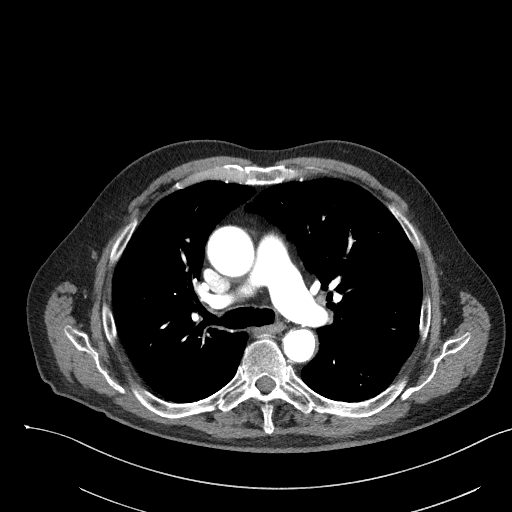
[im 81/116  lung]
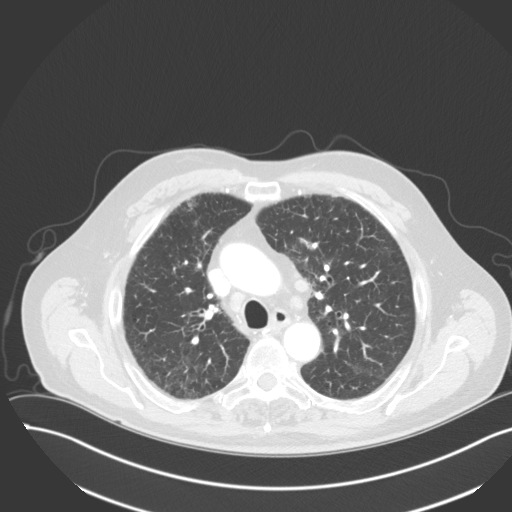
[im 86/116  soft-tissue]
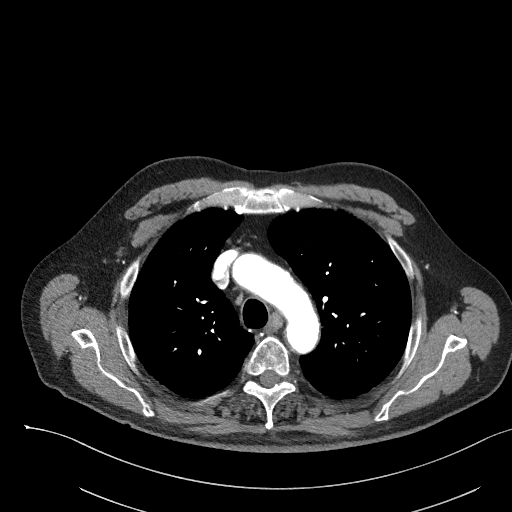
[im 94/116  lung]
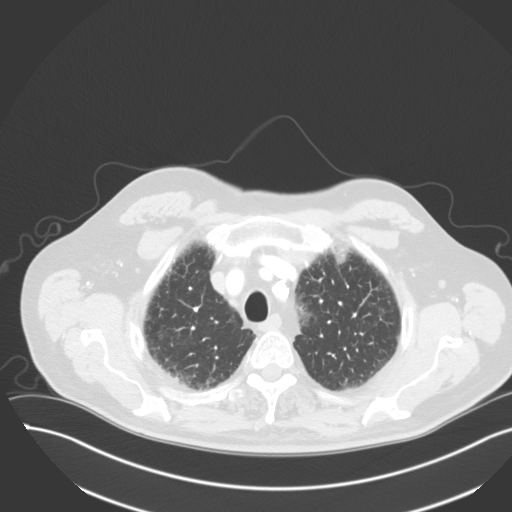
[im 103/116  soft-tissue]
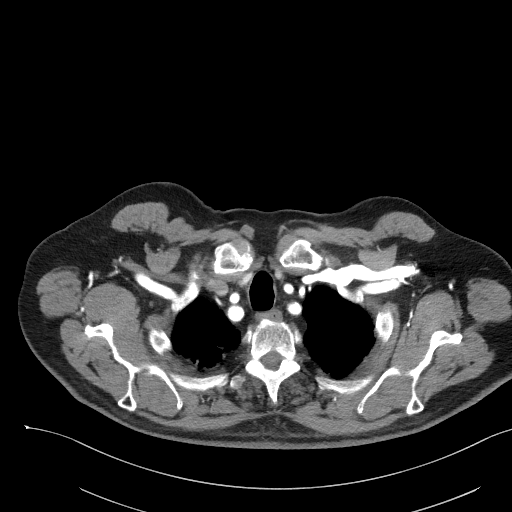
[im 111/116  lung]
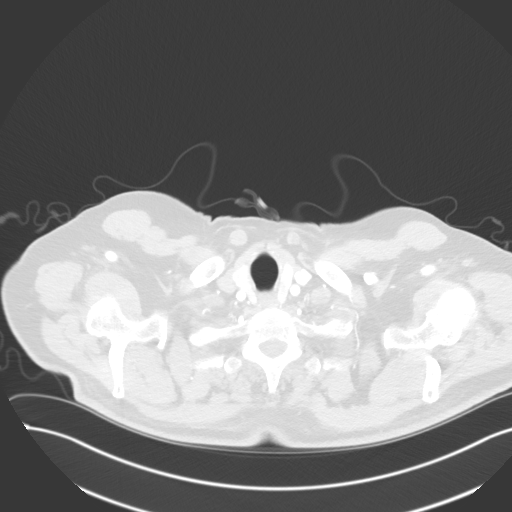

[Series 7: coronals · coronal · 0.73mm/px · 3 of 140 slices shown]
[im 35/140  soft-tissue]
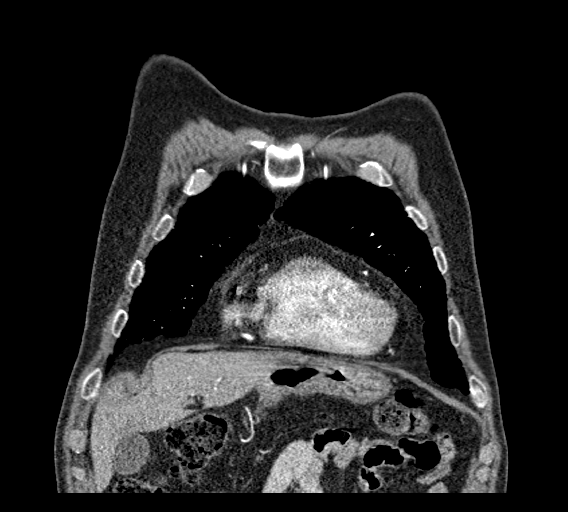
[im 70/140  soft-tissue]
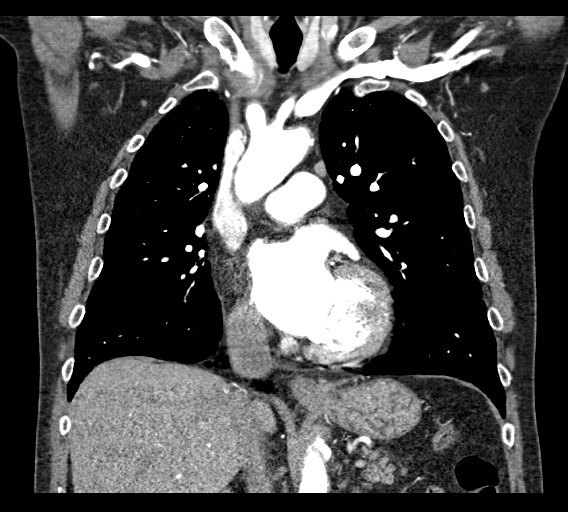
[im 105/140  soft-tissue]
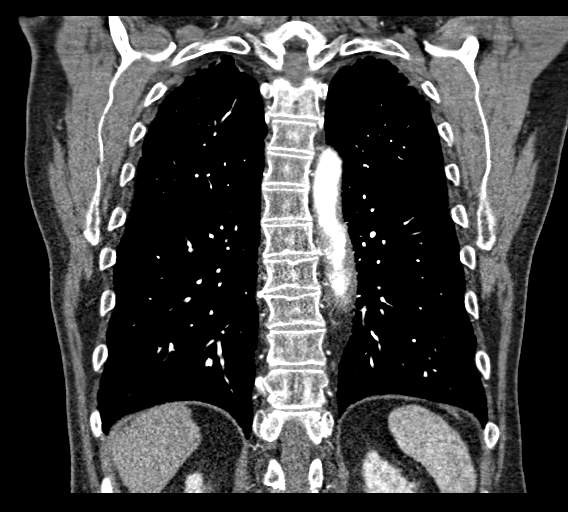

[18 of 46 positions shown; findings below may reference images not displayed]

FINDINGS: Cardiovascular: There are focal coronary calcifications. Heart size
is normal. No pericardial effusion. There is atherosclerotic
calcification of the thoracic aorta, ascending aorta measuring a
maximum diameter of 3.9 cm. No evidence for dissection or aneurysm.
Bovine arch anatomy.

Mediastinum/Nodes: Small thyroid nodules are present, consistent
with benign processes. No further evaluation is needed.

Lungs/Pleura: Mild reticular changes are identified throughout the
lungs, with mild perihilar peribronchial thickening. Findings are
unchanged compared to prior studies. No suspicious pulmonary
nodules. No focal consolidations or pleural effusions. Small filling
defects are identified within the left mainstem bronchus, likely
representing mucus.

Upper Abdomen: Gallbladder is present. Probable left hepatic cyst.
There is atherosclerotic calcification of the abdominal aorta. There
is significant atherosclerotic calcification at the origin of the
superior mesenteric artery and celiac axis.

Musculoskeletal: Mild degenerative changes in the midthoracic spine.
Old rib fractures.

Review of the MIP images confirms the above findings.
IMPRESSION: 1. Stable appearance of ascending aorta, measuring upper limits
normal by today's exam. Consider annual CT or MRI.
2. Coronary artery disease.
3. Benign thyroid nodules.
4. Stable appearance of reticular changes in the lungs.
5.  Aortic atherosclerosis.  (XCHFT-Q25.5)

## 2018-11-09 ENCOUNTER — Other Ambulatory Visit: Payer: Self-pay | Admitting: Interventional Cardiology

## 2018-11-28 ENCOUNTER — Other Ambulatory Visit: Payer: Self-pay | Admitting: Physician Assistant

## 2018-12-21 ENCOUNTER — Other Ambulatory Visit: Payer: Self-pay | Admitting: Interventional Cardiology

## 2019-01-23 ENCOUNTER — Other Ambulatory Visit: Payer: Self-pay | Admitting: Interventional Cardiology

## 2019-02-22 ENCOUNTER — Other Ambulatory Visit: Payer: Self-pay | Admitting: Interventional Cardiology

## 2019-02-22 NOTE — Telephone Encounter (Signed)
Pt's pharmacy is requesting a refill on Losartan 25 mg tablets. There are to prescription of Losartan on pt's medication list. Not sure if losartan 50 mg tablet are unavailable, that is why losartan 25 mg tablets were ordered, can we D/C one of the losartan? Please address

## 2019-02-22 NOTE — Telephone Encounter (Signed)
Called and spoke to patient. He states that he has been taking the losartan 25 mg tab: 2 tablets QD since the 50 mg tablets were on back order. States BP is fine no readings available. Patient over due for appt. States will call back to arrange. Rx sent to preferred pharmacy.

## 2019-03-28 ENCOUNTER — Other Ambulatory Visit: Payer: Self-pay | Admitting: Interventional Cardiology

## 2019-04-25 ENCOUNTER — Other Ambulatory Visit: Payer: Self-pay | Admitting: Interventional Cardiology

## 2019-05-10 ENCOUNTER — Other Ambulatory Visit: Payer: Self-pay | Admitting: Interventional Cardiology

## 2019-06-14 ENCOUNTER — Ambulatory Visit: Payer: Medicare Other | Admitting: Interventional Cardiology

## 2019-07-22 ENCOUNTER — Other Ambulatory Visit: Payer: Self-pay | Admitting: Interventional Cardiology

## 2019-08-07 ENCOUNTER — Other Ambulatory Visit: Payer: Self-pay | Admitting: Interventional Cardiology

## 2019-09-06 ENCOUNTER — Ambulatory Visit: Payer: Medicare Other | Attending: Internal Medicine

## 2019-09-06 DIAGNOSIS — Z23 Encounter for immunization: Secondary | ICD-10-CM | POA: Insufficient documentation

## 2019-09-06 NOTE — Progress Notes (Signed)
   Covid-19 Vaccination Clinic  Name:  Bruce Cohen    MRN: [id number] DOB: Apr 08, 1941  09/06/2019  Mr. Breach was observed post Covid-19 immunization for 15 minutes without incidence. He was provided with Vaccine Information Sheet and instruction to access the V-Safe system.   Mr. Bart was instructed to call 911 with any severe reactions post vaccine: Marland Kitchen Difficulty breathing  . Swelling of your face and throat  . A fast heartbeat  . A bad rash all over your body  . Dizziness and weakness    Immunizations Administered    Name Date Dose VIS Date Route   Pfizer COVID-19 Vaccine 09/06/2019 11:51 AM 0.3 mL 06/21/2019 Intramuscular   Manufacturer: [place]   Lot: [id number]   [place]: [id number]

## 2019-09-30 ENCOUNTER — Ambulatory Visit: Payer: Medicare Other | Admitting: Interventional Cardiology

## 2019-10-02 ENCOUNTER — Ambulatory Visit: Payer: Medicare Other | Attending: Internal Medicine

## 2019-10-02 DIAGNOSIS — Z23 Encounter for immunization: Secondary | ICD-10-CM

## 2019-10-02 NOTE — Progress Notes (Signed)
   Covid-19 Vaccination Clinic  Name:  Bruce Cohen    MRN: [id number] DOB: 1941-01-24  10/02/2019  [name] was observed post Covid-19 immunization for 15 minutes without incident. He was provided with Vaccine Information Sheet and instruction to access the V-Safe system.   [name] was instructed to call 911 with any severe reactions post vaccine: Marland Kitchen Difficulty breathing  . Swelling of face and throat  . A fast heartbeat  . A bad rash all over body  . Dizziness and weakness   Immunizations Administered    Name Date Dose VIS Date Route   Pfizer COVID-19 Vaccine 10/02/2019  9:43 AM 0.3 mL 06/21/2019 Intramuscular   Manufacturer: [place]   Lot: [id number]   [place]: [id number]

## 2019-10-13 ENCOUNTER — Other Ambulatory Visit: Payer: Self-pay | Admitting: Interventional Cardiology

## 2019-10-16 ENCOUNTER — Other Ambulatory Visit: Payer: Self-pay | Admitting: Interventional Cardiology

## 2019-10-22 ENCOUNTER — Telehealth: Payer: Self-pay | Admitting: *Deleted

## 2019-10-22 ENCOUNTER — Ambulatory Visit: Payer: Self-pay | Admitting: Surgery

## 2019-10-22 NOTE — Telephone Encounter (Signed)
   [place], MD  Chart reviewed as part of pre-operative protocol coverage. Because of Bruce Cohen's past medical history and time since last visit, he/she will require a follow-up visit in order to better assess preoperative cardiovascular risk.  Pre-op covering staff: - Please schedule appointment and call patient to inform them. - Please contact requesting surgeon's office via preferred method (i.e, phone, fax) to inform them of need for appointment prior to surgery.  If applicable, this message will also be routed to pharmacy pool for input on holding anticoagulant as requested below so that this information is available at time of patient's appointment.   Abigail Butts, PA-C  10/22/2019, 5:06 PM

## 2019-10-22 NOTE — Telephone Encounter (Signed)
   [facility] Pre-operative Risk Assessment    Request for surgical clearance:  1. What type of surgery is being performed? EXCISION OF SKIN LESION TO LEFT CHEST WALL   2. When is this surgery scheduled? TBD   3. What type of clearance is required (medical clearance vs. Pharmacy clearance to hold med vs. Both)? BOTH  4. Are there any medications that need to be held prior to surgery and how long? ELIQUIS   5. Practice name and name of physician performing surgery? [facility]; DR. Sherren Mocha GERKIN   6. What is your office phone number [phone]    7.   What is your office fax number [phone] ATTN: Mammie Lorenzo, LPN  8.   Anesthesia type (None, local, MAC, general) ? LMA   Julaine Hua 10/22/2019, 4:43 PM  _________________________________________________________________   (provider comments below)

## 2019-10-23 NOTE — Telephone Encounter (Signed)
Patient with diagnosis of ATRIAL FIBRILLATON on ELIQUIS for anticoagulation.    Procedure: EXCISION OF SKIN LESION Date of procedure: TBD  CHADS2-VASc score of  5 (CHF, HTN, AGE X 2, CAD)  CrCl = 65ML/MIN  PRE OFFCIE PROTOCOL NO NEED TO HOLD ELIQUIS FOR MOST DERMATOLOGICAL PROCEDURES.   *MR Hippe MAY hold ELIQUIS for 24 hours if procedure provider consider excision area is a higher risk of bleeding*

## 2019-10-23 NOTE — Telephone Encounter (Signed)
Pt has appt with Dr. Irish Lack 10/24/19 for pre op clearance. I will forward clearance notes to MD for upcoming appt. I will send FYI to the surgeon Dr. Armandina Gemma in regards to appt with Dr. Irish Lack. I will remove from the pre op call back pool.

## 2019-10-23 NOTE — Progress Notes (Signed)
Larae Grooms, MD  10/24/2019 3:24 PM    [facility] [address], [place], [place]  [address] Phone: [phone]; Fax: [phone]

## 2019-10-24 ENCOUNTER — Other Ambulatory Visit: Payer: Self-pay

## 2019-10-24 ENCOUNTER — Ambulatory Visit (INDEPENDENT_AMBULATORY_CARE_PROVIDER_SITE_OTHER): Payer: Medicare Other | Admitting: Interventional Cardiology

## 2019-10-24 ENCOUNTER — Encounter: Payer: Self-pay | Admitting: Interventional Cardiology

## 2019-10-24 VITALS — BP 128/70 | HR 55 | Ht 74.5 in | Wt 200.8 lb

## 2019-10-24 DIAGNOSIS — I712 Thoracic aortic aneurysm, without rupture, unspecified: Secondary | ICD-10-CM

## 2019-10-24 DIAGNOSIS — I10 Essential (primary) hypertension: Secondary | ICD-10-CM

## 2019-10-24 DIAGNOSIS — I428 Other cardiomyopathies: Secondary | ICD-10-CM

## 2019-10-24 DIAGNOSIS — I4819 Other persistent atrial fibrillation: Secondary | ICD-10-CM | POA: Diagnosis not present

## 2019-10-24 DIAGNOSIS — I2510 Atherosclerotic heart disease of native coronary artery without angina pectoris: Secondary | ICD-10-CM

## 2019-10-24 DIAGNOSIS — Z01810 Encounter for preprocedural cardiovascular examination: Secondary | ICD-10-CM

## 2019-10-24 MED ORDER — PREDNISONE 10 MG PO TABS: ORAL_TABLET | ORAL | 0 refills | Status: DC

## 2019-10-24 NOTE — Patient Instructions (Signed)
Medication Instructions:  Your physician has recommended you make the following change in your medication:   Take prednisone taper as directed  *If you need a refill on your cardiac medications before your next appointment, please call your pharmacy*   Lab Work: None ordered  If you have labs (blood work) drawn today and your tests are completely normal, you will receive your results only by: Marland Kitchen MyChart Message (if you have MyChart) OR . A paper copy in the mail If you have any lab test that is abnormal or we need to change your treatment, we will call you to review the results.   Testing/Procedures: None ordered   Follow-Up: At [facility], you and your health needs are our priority.  As part of our continuing mission to provide you with exceptional heart care, we have created designated Provider Care Teams.  These Care Teams include your primary Cardiologist (physician) and Advanced Practice Providers (APPs -  Physician Assistants and Nurse Practitioners) who all work together to provide you with the care you need, when you need it.  We recommend signing up for the patient portal called "MyChart".  Sign up information is provided on this After Visit Summary.  MyChart is used to connect with patients for Virtual Visits (Telemedicine).  Patients are able to view lab/test results, encounter notes, upcoming appointments, etc.  Non-urgent messages can be sent to your provider as well.   To learn more about what you can do with MyChart, go to [email or web address].    Your next appointment:   12 month(s)  The format for your next appointment:   In Person  Provider:   You may see Larae Grooms, MD or one of the following Advanced Practice Providers on your designated Care Team:    Melina Copa, PA-C  Ermalinda Barrios, PA-C    Other Instructions

## 2019-10-24 NOTE — Progress Notes (Signed)
Cardiology Office Note   Date:  10/24/2019   ID:  Bruce Cohen, DOB 04/30/1941, MRN [id number]  PCP:  Josetta Huddle, MD    No chief complaint on file.  pericarditis  Wt Readings from Last 3 Encounters:  10/24/19 200 lb 12.8 oz (91.1 kg)  12/13/17 206 lb 6.4 oz (93.6 kg)  06/13/17 202 lb 9.6 oz (91.9 kg)       History of Present Illness: Bruce Cohen is a 79 y.o. 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  He was admitted in3/2016 with acute systolic HF in the setting of AFib with RVR. EF was30-35%. LHC demonstrated mild non-obstructive CAD. NICM was thought to likely be due to tachycardia. There was also concern for ETOH abuse. Heunderwent DCCV 5/13 and again in 4/17. A repeat echocardi  He walks up stairs without any problems.  ogram 12/18/14 demonstrated improved function with an EF of 50%.In7/17, he developedrecurrent AFib andhe was startedon Amiodarone. He converted to NSR.Baseline PFTs did demonstrate mod reduced DLCO and I had him see Dr. Lamonte Sakai. High-resolution CT did demonstrate interlobular septal thickening and subpleural reticulation suggestive of residual edema versus interstitial lung disease, 4 cm ascending thoracic aortic aneurysm. Follow-up CT in one year recommended.  Sx of AFib is SHOB, not palpitations.   Since the last visit, he has felt well.  Denies : Chest pain. Dizziness. Leg edema. Nitroglycerin use. Orthopnea. Palpitations. Paroxysmal nocturnal dyspnea. Shortness of breath. Syncope.   He walks up stairs without a problem.    He [facility] his COVID [facility].    He has a skin lesion on his chest that needs to be removed.    Past Medical History:  Diagnosis Date  . CAD (coronary artery disease)    a. LHC 3/16: pLAD 20, mLAD 20, pRCA 20, EF 25%  . Heart murmur   . History of PFTs    a.  PFTs 8/17: FEV1 88% predicted; FEV1/FVC 81%, corr DLCO 57%  . Hyperlipidemia   . Hypertension   . LVH (left ventricular hypertrophy)   . Mitral regurgitation   . NICM (nonischemic cardiomyopathy) ([place])    Tachy mediated 2/2 AF with RVR >> a. Echo 3/16: EF 30-35% >> b. Echo 6/16:  EF 50% with mild apical HK, mild LVH, Gr 2 DD, mild MR, severe BAE, PASP 43 mmHg  . OSA (obstructive sleep apnea) 02/12/2015   Mild to moderate with AHI 14.7/hr  . PAF (paroxysmal atrial fibrillation) ([place]) 09/2014   CHADS2-VASc=5 >> Eliquis // 6/17: Amiodarone started  . Pericarditis    a. Hospitalized in [place]. Episode in 1998. Had a cath which was clean (also in 1990s). 2 further episodes that were mild.  . Thoracic ascending aortic aneurysm ([place]) 04/26/2016   High res CT 9/17: 4.0 cm ascending thoracic aortic aneurysm // Chest CTA 9/18: ascending aorta 3.9 cm, CAD, benign thyroid nodules, stable lung reticular changes, aortic atherosclerosis    Past Surgical History:  Procedure Laterality Date  . APPENDECTOMY    . BACK SURGERY    . CARDIAC CATHETERIZATION  1998   "clean"  . CARDIOVERSION N/A 11/21/2014   Procedure: CARDIOVERSION;  Surgeon: Fay Records, MD;  [facility] ENDOSCOPY;  Service: Cardiovascular;  Laterality: N/A;  . CARDIOVERSION N/A 10/22/2015   Procedure: CARDIOVERSION;  Surgeon: Herminio Commons, MD;  Location: [facility];  Service: Cardiovascular;  Laterality: N/A;  . FRACTURE SURGERY    .  LEFT HEART CATHETERIZATION WITH CORONARY ANGIOGRAM N/A 10/01/2014   Procedure: LEFT HEART CATHETERIZATION WITH CORONARY ANGIOGRAM;  Surgeon: Wellington Hampshire, MD;  Location: [facility] CATH LAB;  Service: Cardiovascular;  Laterality: N/A;  . LUMBAR LAMINECTOMY  1970's   "partial"  . ORIF FINGER / THUMB FRACTURE Left ~ 2006   "thumb"  . TONSILLECTOMY       Current Outpatient Medications  Medication Sig Dispense Refill  . amiodarone (PACERONE) 200 MG tablet TAKE 1 TABLET EVERY DAY AND 1/2 TABLET ON  SATURDAY AND SUNDAY 90 tablet 1  . amLODipine (NORVASC) 5 MG tablet TAKE 2 TABLETS BY MOUTH EVERY DAY 180 tablet 2  . apixaban (ELIQUIS) 5 MG TABS tablet Take 1 tablet (5 mg total) by mouth 2 (two) times daily. 60 tablet 11  . atorvastatin (LIPITOR) 40 MG tablet Take 1 tablet (40 mg total) by mouth daily. 30 tablet 11  . folic acid (FOLVITE) 1 MG tablet Take 1 tablet (1 mg total) by mouth daily. 30 tablet 2  . furosemide (LASIX) 40 MG tablet TAKE 1 TABLET BY MOUTH DAILY. PLEASE KEEP APPT WITH DR.Darius Fillingim IN MARCH BEFORE ANYMORE REFILLS 30 tablet 0  . losartan (COZAAR) 25 MG tablet Take 2 tablets (50 mg total) by mouth daily. PLEASE SCHEDULE AN APPT FOR FURTHER REFILLS 1ST ATTEMPT [phone] 60 tablet 0  . Multiple Vitamin (MULTIVITAMIN WITH MINERALS) TABS tablet Take 1 tablet by mouth daily.    Marland Kitchen thiamine 100 MG tablet Take 1 tablet (100 mg total) by mouth daily. 30 tablet 2  . predniSONE (DELTASONE) 10 MG tablet Take 5 tablets (50 mg) for 2 days, then take 4 tablets (40 mg) for 2 days, then take 3 tablets (30 mg) for 2 days, then take 2 tablets (20 mg) for 2 days, then take 1 tablet (10 mg) for 2 days, then take 1/2 tablet (5 mg) for 2 days 31 tablet 0   No current facility-administered medications for this visit.    Allergies:   Patient has no known allergies.    Social History:  The patient  reports that he quit smoking about 21 years ago. His smoking use included cigarettes. He has a 15.00 pack-year smoking history. He has never used smokeless tobacco. He reports current alcohol use. He reports that he does not use drugs.   Family History:  The patient's family history includes Heart disease in his mother; Heart failure in his father; Stroke in his mother.    ROS:  Please see the history of present illness.   Otherwise, review of systems are positive for intentional weight loss.   All other systems are reviewed and negative.    PHYSICAL EXAM: VS:  BP 128/70   Pulse (!) 55   Ht 6'  2.5" (1.892 m)   Wt 200 lb 12.8 oz (91.1 kg)   SpO2 91%   BMI 25.44 kg/m  , BMI Body mass index is 25.44 kg/m. GEN: Well nourished, well developed, in no acute distress  HEENT: normal  Neck: no JVD, carotid bruits, or masses Cardiac: RRR; no murmurs, rubs, or gallops,no edema  Respiratory:  clear to auscultation bilaterally, normal work of breathing GI: soft, nontender, nondistended, + BS MS: no deformity or atrophy  Skin: warm and dry, no rash Neuro:  Strength and sensation are intact Psych: euthymic mood, full affect   EKG:   The ekg ordered today demonstrates sinus bradycardia, no ST segment changes   Recent Labs: No results found for requested labs within last  8760 hours.   Lipid Panel    Component Value Date/Time   CHOL 122 01/14/2015 1147   TRIG 92.0 01/14/2015 1147   HDL 43.90 01/14/2015 1147   CHOLHDL 3 01/14/2015 1147   VLDL 18.4 01/14/2015 1147   LDLCALC 60 01/14/2015 1147     Other studies Reviewed: Additional studies/ records that were reviewed today with results demonstrating: most recent labs reviewed.   ASSESSMENT AND PLAN:  1. Persistent AFib: In NSR. No bleeding issues with ELiquis. 2. NICM: No CHF sx. 3. CAD: Mild by 2016 cath.  No angina 4. Thoracic aortic aneurysm: Minimal dilated aortic root in 2018. 5. HTN: The current medical regimen is effective;  continue present plan and medications. 6. Pericarditis: Stable.  Rx for prednisone taper.   7. No further testing needed beofre surgery.  OK to hold Eliquis 2 days prior to surgery.   Current medicines are reviewed at length with the patient today.  The patient concerns regarding his medicines were addressed.  The following changes have been made:  Prednisone taper prescribed  Labs/ tests ordered today include:   Orders Placed This Encounter  Procedures  . EKG 12-Lead    Recommend 150 minutes/week of aerobic exercise Low fat, low carb, high fiber diet recommended  Disposition:   FU in  1 year   Signed, Larae Grooms, MD  10/24/2019 3:24 PM    [facility] [address], [place], [place]  [address] Phone: [phone]; Fax: [phone]

## 2019-10-25 NOTE — Progress Notes (Signed)
Called and made patient aware that Dr. Irish Lack is okay with him holding his Eliquis for 2 days prior to his skin surgery. Patient verbalized understanding. OV notes with clearance sent to Dr. Harlow Asa.

## 2019-10-28 ENCOUNTER — Other Ambulatory Visit (HOSPITAL_COMMUNITY)
Admission: RE | Admit: 2019-10-28 | Discharge: 2019-10-28 | Disposition: A | Discharge status: Home / Self Care | Payer: Medicare Other | Source: Ambulatory Visit | Attending: Surgery | Admitting: Surgery

## 2019-10-28 DIAGNOSIS — Z20822 Contact with and (suspected) exposure to covid-19: Secondary | ICD-10-CM | POA: Insufficient documentation

## 2019-10-28 DIAGNOSIS — Z01812 Encounter for preprocedural laboratory examination: Secondary | ICD-10-CM | POA: Diagnosis present

## 2019-10-28 LAB — SARS CORONAVIRUS 2 (TAT 6-24 HRS): SARS Coronavirus 2: NEGATIVE

## 2019-10-28 NOTE — Progress Notes (Signed)
DUE TO COVID-19 ONLY ONE VISITOR IS ALLOWED TO COME WITH YOU AND STAY IN THE WAITING ROOM ONLY DURING PRE OP AND PROCEDURE DAY OF SURGERY. THE 1 VISITOR MAY VISIT WITH YOU AFTER SURGERY IN YOUR PRIVATE ROOM DURING VISITING HOURS ONLY!  YOU NEED TO HAVE A COVID 19 TEST ON_______ @_______ , THIS TEST MUST BE DONE BEFORE SURGERY, COME  [address], [place] , [address].  ([place]) ONCE YOUR COVID TEST IS COMPLETED, PLEASE BEGIN THE QUARANTINE INSTRUCTIONS AS OUTLINED IN YOUR HANDOUT.                HALL ASHBECK  10/28/2019   Your procedure is scheduled on:  10/31/19   Report to [facility] Main  Entrance   Report to admitting at   [place] AM     Call this number if you have problems the morning of surgery [phone]    Remember: Do not eat food   :After Midnight. BRUSH YOUR TEETH MORNING OF SURGERY AND RINSE YOUR MOUTH OUT, NO CHEWING GUM CANDY OR MINTS.     Take these medicines the morning of surgery with A SIP OF WATER:  Pacerone, amlodipine  DO NOT TAKE ANY DIABETIC MEDICATIONS DAY OF YOUR SURGERY                               You may not have any metal on your body including hair pins and              piercings  Do not wear jewelry, make-up, lotions, powders or perfumes, deodorant             Do not wear nail polish on your fingernails.  Do not shave  48 hours prior to surgery.              Men may shave face and neck.   Do not bring valuables to the hospital. [place].  Contacts, dentures or bridgework may not be worn into surgery.  Leave suitcase in the car. After surgery it may be brought to your room.     Patients discharged the day of surgery will not be allowed to drive home. IF YOU ARE HAVING SURGERY AND GOING HOME THE SAME DAY, YOU MUST HAVE AN ADULT TO DRIVE YOU HOME AND BE WITH YOU FOR 24 HOURS. YOU MAY GO HOME BY TAXI OR UBER OR ORTHERWISE, BUT AN ADULT MUST ACCOMPANY YOU HOME AND STAY  WITH YOU FOR 24 HOURS.  Name and phone number of your driver:  Special Instructions: N/A              Please read over the following fact sheets you were given: _____________________________________________________________________             NO SOLID FOOD AFTER MIDNIGHT THE NIGHT PRIOR TO SURGERY. NOTHING BY MOUTH EXCEPT CLEAR LIQUIDS UNTIL    0530am. PLEASE FINISH ENSURE DRINK PER SURGEON ORDER  WHICH NEEDS TO BE COMPLETED AT 0530am.   CLEAR LIQUID DIET   Foods Allowed  Foods Excluded  Coffee and tea, regular and decaf                             liquids that you cannot  Plain Jell-O any favor except red or purple                                           see through such as: Fruit ices (not with fruit pulp)                                     milk, soups, orange juice  Iced Popsicles                                    All solid food Carbonated beverages, regular and diet                                    Cranberry, grape and apple juices Sports drinks like Gatorade Lightly seasoned clear broth or consume(fat free) Sugar, honey syrup  Sample Menu Breakfast                                Lunch                                     Supper Cranberry juice                    Beef broth                            Chicken broth Jell-O                                     Grape juice                           Apple juice Coffee or tea                        Jell-O                                      Popsicle                                                Coffee or tea                        Coffee or tea  _____________________________________________________________________  [facility] - Preparing for Surgery Before surgery, you can play an important role.  Because skin is not sterile, your skin  needs to be as free of germs as possible.  You can reduce the number of germs on your skin by washing with CHG  (chlorahexidine gluconate) soap before surgery.  CHG is an antiseptic cleaner which kills germs and bonds with the skin to continue killing germs even after washing. Please DO NOT use if you have an allergy to CHG or antibacterial soaps.  If your skin becomes reddened/irritated stop using the CHG and inform your nurse when you arrive at Short Stay. Do not shave (including legs and underarms) for at least 48 hours prior to the first CHG shower.  You may shave your face/neck. Please follow these instructions carefully:  1.  Shower with CHG Soap the night before surgery and the  morning of Surgery.  2.  If you choose to wash your hair, wash your hair first as usual with your  normal  shampoo.  3.  After you shampoo, rinse your hair and body thoroughly to remove the  shampoo.                           4.  Use CHG as you would any other liquid soap.  You can apply chg directly  to the skin and wash                       Gently with a scrungie or clean washcloth.  5.  Apply the CHG Soap to your body ONLY FROM THE NECK DOWN.   Do not use on face/ open                           Wound or open sores. Avoid contact with eyes, ears mouth and genitals (private parts).                       Wash face,  Genitals (private parts) with your normal soap.             6.  Wash thoroughly, paying special attention to the area where your surgery  will be performed.  7.  Thoroughly rinse your body with warm water from the neck down.  8.  DO NOT shower/wash with your normal soap after using and rinsing off  the CHG Soap.                9.  Pat yourself dry with a clean towel.            10.  Wear clean pajamas.            11.  Place clean sheets on your bed the night of your first shower and do not  sleep with pets. Day of Surgery : Do not apply any lotions/deodorants the morning of surgery.  Please wear clean clothes to the hospital/surgery center.  FAILURE TO FOLLOW THESE INSTRUCTIONS MAY RESULT IN THE CANCELLATION OF  YOUR SURGERY PATIENT SIGNATURE_________________________________  NURSE SIGNATURE__________________________________  ________________________________________________________________________

## 2019-10-30 ENCOUNTER — Encounter (HOSPITAL_COMMUNITY)
Admission: RE | Admit: 2019-10-30 | Discharge: 2019-10-30 | Disposition: A | Discharge status: Home / Self Care | Payer: Medicare Other | Source: Ambulatory Visit | Attending: Surgery | Admitting: Surgery

## 2019-10-30 ENCOUNTER — Encounter (HOSPITAL_COMMUNITY): Payer: Self-pay

## 2019-10-30 ENCOUNTER — Other Ambulatory Visit: Payer: Self-pay

## 2019-10-30 DIAGNOSIS — Z01818 Encounter for other preprocedural examination: Secondary | ICD-10-CM | POA: Insufficient documentation

## 2019-10-30 DIAGNOSIS — R9431 Abnormal electrocardiogram [ECG] [EKG]: Secondary | ICD-10-CM | POA: Diagnosis not present

## 2019-10-30 HISTORY — DX: Other specified postprocedural states: Z98.890

## 2019-10-30 HISTORY — DX: Cardiac arrhythmia, unspecified: I49.9

## 2019-10-30 HISTORY — DX: Heart failure, unspecified: I50.9

## 2019-10-30 HISTORY — DX: Nausea with vomiting, unspecified: R11.2

## 2019-10-30 LAB — CBC
HCT: 44.8 % (ref 39.0–52.0)
Hemoglobin: 14.9 g/dL (ref 13.0–17.0)
MCH: 32.7 pg (ref 26.0–34.0)
MCHC: 33.3 g/dL (ref 30.0–36.0)
MCV: 98.2 fL (ref 80.0–100.0)
Platelets: 380 10*3/uL (ref 150–400)
RBC: 4.56 MIL/uL (ref 4.22–5.81)
RDW: 13.2 % (ref 11.5–15.5)
WBC: 11.2 10*3/uL — ABNORMAL HIGH (ref 4.0–10.5)
nRBC: 0 % (ref 0.0–0.2)

## 2019-10-30 LAB — BASIC METABOLIC PANEL
Anion gap: 13 (ref 5–15)
BUN: 17 mg/dL (ref 8–23)
CO2: 25 mmol/L (ref 22–32)
Calcium: 9.1 mg/dL (ref 8.9–10.3)
Chloride: 102 mmol/L (ref 98–111)
Creatinine, Ser: 1.36 mg/dL — ABNORMAL HIGH (ref 0.61–1.24)
GFR calc Af Amer: 57 mL/min — ABNORMAL LOW (ref >60)
GFR calc non Af Amer: 49 mL/min — ABNORMAL LOW (ref >60)
Glucose, Bld: 101 mg/dL — ABNORMAL HIGH (ref 70–99)
Potassium: 4.1 mmol/L (ref 3.5–5.1)
Sodium: 140 mmol/L (ref 135–145)

## 2019-10-30 NOTE — H&P (View-Only) (Signed)
Anesthesia Chart Review   Case: [id number] Date/Time: 10/31/19 0815   Procedure: EXCISION SKIN NEOPLASM LEFT CHEST WALL (Left )   Anesthesia type: General   Pre-op diagnosis: SKIN NEOPLASM   Location: [facility] ROOM 05 / [facility]   Surgeons: Armandina Gemma, MD      DISCUSSION:79 y.o. former smoker (15 pack years, quit 07/11/98) with h/o PONV, HTN, HLD, PAF (on Eliquis), NICM, CAD (LHC 3/16: pLAD 80, mLAD 95, pRCA 74, EF 25%), recurrent pericarditis, thoracic aortic aneurysm (4cm followed by cardio), OSA, skin neoplasm scheduled for above procedure 10/31/2019 with Dr. Armandina Gemma.   Pt last seen by cardiologist, Dr. Larae Grooms, 10/24/2019.  Per OV note,  "1. Persistent AFib: In NSR. No bleeding issues with ELiquis. 2. NICM: No CHF sx. 3. CAD: Mild by 2016 cath.  No angina 4. Thoracic aortic aneurysm: Minimal dilated aortic root in 2018. 5. HTN: The current medical regimen is effective;  continue present plan and medications. 6. Pericarditis: Stable.  Rx for prednisone taper.     7. No further testing needed beofre surgery.  OK to hold Eliquis 2 days prior to surgery."  Anticipate pt can proceed with planned procedure barring acute status change.   VS: There were no vitals taken for this visit.  PROVIDERS: Josetta Huddle, MD is PCP   Larae Grooms, MD is Cardiologist  LABS: DOS (all labs ordered are listed, but only abnormal results are displayed)  Labs Reviewed - No data to display   IMAGES:   EKG: EKG tracing requested 10/24/2019 Sinus bradycardia, no ST segment changes   CV: Echo 12/18/2014 Study Conclusions   - Left ventricle: LVEF is approximately 50% with mild apical  hypokinesis. Improved from echo of March 2016. The cavity size  was mildly dilated. Wall thickness was increased in a pattern of  mild LVH. Features are consistent with a pseudonormal left  ventricular filling pattern, with concomitant abnormal relaxation  and increased filling pressure (grade 2  diastolic dysfunction).  - Mitral valve: There was mild regurgitation.  - Left atrium: The atrium was severely dilated.  - Right atrium: The atrium was severely dilated.  - Pulmonary arteries: PA peak pressure: 43 mm Hg (S).  Past Medical History:  Diagnosis Date  . CAD (coronary artery disease)    a. LHC 3/16: pLAD 20, mLAD 20, pRCA 20, EF 25%  . CHF (congestive heart failure) (HCC)    hx of acute systolic hf- Q000111Q  . Dysrhythmia    afib   . Heart murmur    since birth   . History of PFTs    a. PFTs 8/17: FEV1 88% predicted; FEV1/FVC 81%, corr DLCO 57%  . Hyperlipidemia   . Hypertension   . LVH (left ventricular hypertrophy)   . Mitral regurgitation   . NICM (nonischemic cardiomyopathy) ([place])    Tachy mediated 2/2 AF with RVR >> a. Echo 3/16: EF 30-35% >> b. Echo 6/16:  EF 50% with mild apical HK, mild LVH, Gr 2 DD, mild MR, severe BAE, PASP 43 mmHg  . OSA (obstructive sleep apnea) 02/12/2015   Mild to moderate with AHI 14.7/hr no cpap or oxygen   . PAF (paroxysmal atrial fibrillation) ([place]) 09/2014   CHADS2-VASc=5 >> Eliquis // 6/17: Amiodarone started  . Pericarditis    a. Hospitalized in [place]. Episode in 1998. Had a cath which was clean (also in 1990s). 2 further episodes that were mild.  Marland Kitchen PONV (postoperative nausea and vomiting)   . Thoracic ascending aortic  aneurysm ([place]) 04/26/2016   High res CT 9/17: 4.0 cm ascending thoracic aortic aneurysm // Chest CTA 9/18: ascending aorta 3.9 cm, CAD, benign thyroid nodules, stable lung reticular changes, aortic atherosclerosis    Past Surgical History:  Procedure Laterality Date  . APPENDECTOMY    . BACK SURGERY    . CARDIAC CATHETERIZATION  1998   "clean"  . CARDIOVERSION N/A 11/21/2014   Procedure: CARDIOVERSION;  Surgeon: Fay Records, MD;  Location: [facility] ENDOSCOPY;  Service: Cardiovascular;  Laterality: N/A;  . CARDIOVERSION N/A 10/22/2015   Procedure: CARDIOVERSION;  Surgeon: Herminio Commons, MD;  Location: [place];   Service: Cardiovascular;  Laterality: N/A;  . FRACTURE SURGERY    . LEFT HEART CATHETERIZATION WITH CORONARY ANGIOGRAM N/A 10/01/2014   Procedure: LEFT HEART CATHETERIZATION WITH CORONARY ANGIOGRAM;  Surgeon: Wellington Hampshire, MD;  Location: [place] CATH LAB;  Service: Cardiovascular;  Laterality: N/A;  . LUMBAR LAMINECTOMY  1970's   "partial"  . ORIF FINGER / THUMB FRACTURE Left ~ 2006   "thumb"  . TONSILLECTOMY      MEDICATIONS: . amiodarone (PACERONE) 200 MG tablet  . amLODipine (NORVASC) 5 MG tablet  . apixaban (ELIQUIS) 5 MG TABS tablet  . cholecalciferol (VITAMIN D3) 25 MCG (1000 UNIT) tablet  . furosemide (LASIX) 40 MG tablet  . losartan (COZAAR) 25 MG tablet  . Multiple Vitamin (MULTIVITAMIN WITH MINERALS) TABS tablet   No current facility-administered medications for this encounter.    Maia Plan [facility] Pre-Surgical Testing [phone] 10/30/19  10:56 AM

## 2019-10-30 NOTE — Progress Notes (Signed)
Anesthesia Chart Review   Case: [id number] Date/Time: 10/31/19 0815   Procedure: EXCISION SKIN NEOPLASM LEFT CHEST WALL (Left )   Anesthesia type: General   Pre-op diagnosis: SKIN NEOPLASM   Location[facility]   Surgeons: Armandina Gemma, MD      DISCUSSION:79 y.o. former smoker (15 pack years, quit 07/11/98) with h/o PONV, HTN, HLD, PAF (on Eliquis), NICM, CAD (LHC 3/16: pLAD 23, mLAD 37, pRCA 43, EF 25%), recurrent pericarditis, thoracic aortic aneurysm (4cm followed by cardio), OSA, skin neoplasm scheduled for above procedure 10/31/2019 with Dr. Armandina Gemma.   Pt last seen by cardiologist, Dr. Larae Grooms, 10/24/2019.  Per OV note,  "1. Persistent AFib: In NSR. No bleeding issues with ELiquis. 2. NICM: No CHF sx. 3. CAD: Mild by 2016 cath.  No angina 4. Thoracic aortic aneurysm: Minimal dilated aortic root in 2018. 5. HTN: The current medical regimen is effective;  continue present plan and medications. 6. Pericarditis: Stable.  Rx for prednisone taper.     7. No further testing needed beofre surgery.  OK to hold Eliquis 2 days prior to surgery."  Anticipate pt can proceed with planned procedure barring acute status change.   VS: There were no vitals taken for this visit.  PROVIDERS: Josetta Huddle, MD is PCP   Larae Grooms, MD is Cardiologist  LABS: DOS (all labs ordered are listed, but only abnormal results are displayed)  Labs Reviewed - No data to display   IMAGES:   EKG: EKG tracing requested 10/24/2019 Sinus bradycardia, no ST segment changes   CV: Echo 12/18/2014 Study Conclusions   - Left ventricle: LVEF is approximately 50% with mild apical  hypokinesis. Improved from echo of March 2016. The cavity size  was mildly dilated. Wall thickness was increased in a pattern of  mild LVH. Features are consistent with a pseudonormal left  ventricular filling pattern, with concomitant abnormal relaxation  and increased filling pressure (grade 2  diastolic dysfunction).  - Mitral valve: There was mild regurgitation.  - Left atrium: The atrium was severely dilated.  - Right atrium: The atrium was severely dilated.  - Pulmonary arteries: PA peak pressure: 43 mm Hg (S).  Past Medical History:  Diagnosis Date  . CAD (coronary artery disease)    a. LHC 3/16: pLAD 20, mLAD 20, pRCA 20, EF 25%  . CHF (congestive heart failure) (HCC)    hx of acute systolic hf- Q000111Q  . Dysrhythmia    afib   . Heart murmur    since birth   . History of PFTs    a. PFTs 8/17: FEV1 88% predicted; FEV1/FVC 81%, corr DLCO 57%  . Hyperlipidemia   . Hypertension   . LVH (left ventricular hypertrophy)   . Mitral regurgitation   . NICM (nonischemic cardiomyopathy) ([place])    Tachy mediated 2/2 AF with RVR >> a. Echo 3/16: EF 30-35% >> b. Echo 6/16:  EF 50% with mild apical HK, mild LVH, Gr 2 DD, mild MR, severe BAE, PASP 43 mmHg  . OSA (obstructive sleep apnea) 02/12/2015   Mild to moderate with AHI 14.7/hr no cpap or oxygen   . PAF (paroxysmal atrial fibrillation) ([place]) 09/2014   CHADS2-VASc=5 >> Eliquis // 6/17: Amiodarone started  . Pericarditis    a. Hospitalized in [place]. Episode in 1998. Had a cath which was clean (also in 1990s). 2 further episodes that were mild.  Marland Kitchen PONV (postoperative nausea and vomiting)   . Thoracic ascending aortic  aneurysm ([place]) 04/26/2016   High res CT 9/17: 4.0 cm ascending thoracic aortic aneurysm // Chest CTA 9/18: ascending aorta 3.9 cm, CAD, benign thyroid nodules, stable lung reticular changes, aortic atherosclerosis    Past Surgical History:  Procedure Laterality Date  . APPENDECTOMY    . BACK SURGERY    . CARDIAC CATHETERIZATION  1998   "clean"  . CARDIOVERSION N/A 11/21/2014   Procedure: CARDIOVERSION;  Surgeon: Fay Records, MD;  Location: [facility] ENDOSCOPY;  Service: Cardiovascular;  Laterality: N/A;  . CARDIOVERSION N/A 10/22/2015   Procedure: CARDIOVERSION;  Surgeon: Herminio Commons, MD;  Location: [place];   Service: Cardiovascular;  Laterality: N/A;  . FRACTURE SURGERY    . LEFT HEART CATHETERIZATION WITH CORONARY ANGIOGRAM N/A 10/01/2014   Procedure: LEFT HEART CATHETERIZATION WITH CORONARY ANGIOGRAM;  Surgeon: Wellington Hampshire, MD;  Location: [place] CATH LAB;  Service: Cardiovascular;  Laterality: N/A;  . LUMBAR LAMINECTOMY  1970's   "partial"  . ORIF FINGER / THUMB FRACTURE Left ~ 2006   "thumb"  . TONSILLECTOMY      MEDICATIONS: . amiodarone (PACERONE) 200 MG tablet  . amLODipine (NORVASC) 5 MG tablet  . apixaban (ELIQUIS) 5 MG TABS tablet  . cholecalciferol (VITAMIN D3) 25 MCG (1000 UNIT) tablet  . furosemide (LASIX) 40 MG tablet  . losartan (COZAAR) 25 MG tablet  . Multiple Vitamin (MULTIVITAMIN WITH MINERALS) TABS tablet   No current facility-administered medications for this encounter.    Maia Plan [facility] Pre-Surgical Testing [phone] 10/30/19  10:56 AM

## 2019-10-30 NOTE — Progress Notes (Addendum)
Anesthesia Review:  PCP: Cardiologist :DR Irish Lack - cleared pt for surgery  Chest x-ray : EKG : done 10/24/19- office visit with DR Irish Lack- have called medical records at cardiology to get ekg scanned in - if not scanned in by lab appt on 10/30/19 will repeat ekg  Echo :2017-epic  CT chest- 2018- to evaluate thoracic aortic aneurysm  Cardiac Cath :  Sleep Study/ CPAP : Fasting Blood Sugar :      / Checks Blood Sugar -- times a day:   Blood Thinner/ Instructions /Last Dose: ASA / Instructions/ Last Dose :  Eliquis- per patient and office visit note - last dose on 4.19.21 pm  Patient denies shortness of breath, chest pain, fever, and cough at this phone interview.

## 2019-10-30 NOTE — Progress Notes (Signed)
Patient called back to say he did not receive Ensure preop drink in his instruction bag.  Instructed patient to have clear liquids until 0530am and reviewed clear liquid instructions with patient.  Patient stated he had went to store and bought some Ensure but I told him not to drink that Ensure .  Patient voiced understanding.

## 2019-10-31 ENCOUNTER — Ambulatory Visit (HOSPITAL_COMMUNITY): Payer: Medicare Other | Admitting: Registered Nurse

## 2019-10-31 ENCOUNTER — Ambulatory Visit (HOSPITAL_COMMUNITY): Payer: Medicare Other | Admitting: Physician Assistant

## 2019-10-31 ENCOUNTER — Encounter (HOSPITAL_COMMUNITY)
Admission: RE | Disposition: A | Discharge status: Home / Self Care | Payer: Self-pay | Source: Home / Self Care | Attending: Surgery

## 2019-10-31 ENCOUNTER — Encounter (HOSPITAL_COMMUNITY): Payer: Self-pay | Admitting: Surgery

## 2019-10-31 ENCOUNTER — Ambulatory Visit (HOSPITAL_COMMUNITY)
Admission: RE | Admit: 2019-10-31 | Discharge: 2019-10-31 | Disposition: A | Discharge status: Home / Self Care | Payer: Medicare Other | Attending: Surgery | Admitting: Surgery

## 2019-10-31 DIAGNOSIS — I2510 Atherosclerotic heart disease of native coronary artery without angina pectoris: Secondary | ICD-10-CM | POA: Diagnosis not present

## 2019-10-31 DIAGNOSIS — J449 Chronic obstructive pulmonary disease, unspecified: Secondary | ICD-10-CM | POA: Diagnosis not present

## 2019-10-31 DIAGNOSIS — C44519 Basal cell carcinoma of skin of other part of trunk: Secondary | ICD-10-CM | POA: Diagnosis not present

## 2019-10-31 DIAGNOSIS — E785 Hyperlipidemia, unspecified: Secondary | ICD-10-CM | POA: Diagnosis not present

## 2019-10-31 DIAGNOSIS — I340 Nonrheumatic mitral (valve) insufficiency: Secondary | ICD-10-CM | POA: Insufficient documentation

## 2019-10-31 DIAGNOSIS — I509 Heart failure, unspecified: Secondary | ICD-10-CM | POA: Insufficient documentation

## 2019-10-31 DIAGNOSIS — G4733 Obstructive sleep apnea (adult) (pediatric): Secondary | ICD-10-CM | POA: Diagnosis not present

## 2019-10-31 DIAGNOSIS — Z7901 Long term (current) use of anticoagulants: Secondary | ICD-10-CM | POA: Insufficient documentation

## 2019-10-31 DIAGNOSIS — Z87891 Personal history of nicotine dependence: Secondary | ICD-10-CM | POA: Diagnosis not present

## 2019-10-31 DIAGNOSIS — I480 Paroxysmal atrial fibrillation: Secondary | ICD-10-CM | POA: Insufficient documentation

## 2019-10-31 DIAGNOSIS — Z79899 Other long term (current) drug therapy: Secondary | ICD-10-CM | POA: Diagnosis not present

## 2019-10-31 DIAGNOSIS — I110 Hypertensive heart disease with heart failure: Secondary | ICD-10-CM | POA: Diagnosis not present

## 2019-10-31 DIAGNOSIS — D492 Neoplasm of unspecified behavior of bone, soft tissue, and skin: Secondary | ICD-10-CM | POA: Diagnosis present

## 2019-10-31 DIAGNOSIS — I428 Other cardiomyopathies: Secondary | ICD-10-CM | POA: Insufficient documentation

## 2019-10-31 HISTORY — PX: LESION REMOVAL: SHX5196

## 2019-10-31 SURGERY — WIDE EXCISION, LESION, UPPER EXTREMITY
Anesthesia: General | Laterality: Left

## 2019-10-31 MED ORDER — LIDOCAINE 2% (20 MG/ML) 5 ML SYRINGE
INTRAMUSCULAR | Status: DC | PRN
  Administered 2019-10-31: 60 mg via INTRAVENOUS

## 2019-10-31 MED ORDER — PROPOFOL 10 MG/ML IV BOLUS
INTRAVENOUS | Status: DC | PRN
  Administered 2019-10-31: 80 mg via INTRAVENOUS

## 2019-10-31 MED ORDER — ONDANSETRON HCL 4 MG/2ML IJ SOLN
INTRAMUSCULAR | Status: DC | PRN
  Administered 2019-10-31: 4 mg via INTRAVENOUS

## 2019-10-31 MED ORDER — ETOMIDATE 2 MG/ML IV SOLN
INTRAVENOUS | Status: DC | PRN
  Administered 2019-10-31: 20 mg via INTRAVENOUS

## 2019-10-31 MED ORDER — ACETAMINOPHEN 10 MG/ML IV SOLN
INTRAVENOUS | Status: AC
  Filled 2019-10-31: qty 100

## 2019-10-31 MED ORDER — ETOMIDATE 2 MG/ML IV SOLN
INTRAVENOUS | Status: AC
  Filled 2019-10-31: qty 10

## 2019-10-31 MED ORDER — PHENYLEPHRINE HCL-NACL 20-0.9 MG/250ML-% IV SOLN
INTRAVENOUS | Status: DC | PRN
  Administered 2019-10-31: 25 ug/min via INTRAVENOUS

## 2019-10-31 MED ORDER — 0.9 % SODIUM CHLORIDE (POUR BTL) OPTIME
TOPICAL | Status: DC | PRN
  Administered 2019-10-31: 1000 mL

## 2019-10-31 MED ORDER — CHLORHEXIDINE GLUCONATE CLOTH 2 % EX PADS: 6.0000 | MEDICATED_PAD | Freq: Once | CUTANEOUS | Status: DC

## 2019-10-31 MED ORDER — LIDOCAINE HCL (PF) 1 % IJ SOLN
INTRAMUSCULAR | Status: AC
  Filled 2019-10-31: qty 30

## 2019-10-31 MED ORDER — BUPIVACAINE-EPINEPHRINE 0.5% -1:200000 IJ SOLN
INTRAMUSCULAR | Status: DC | PRN
  Administered 2019-10-31: 20 mL

## 2019-10-31 MED ORDER — CEFAZOLIN SODIUM-DEXTROSE 2-4 GM/100ML-% IV SOLN
2.0000 g | INTRAVENOUS | Status: AC
  Administered 2019-10-31: 09:00:00 2 g via INTRAVENOUS
  Filled 2019-10-31: qty 100

## 2019-10-31 MED ORDER — OXYCODONE HCL 5 MG/5ML PO SOLN: 5.0000 mg | Freq: Once | ORAL | Status: DC | PRN

## 2019-10-31 MED ORDER — PROPOFOL 10 MG/ML IV BOLUS
INTRAVENOUS | Status: AC
  Filled 2019-10-31: qty 20

## 2019-10-31 MED ORDER — DEXAMETHASONE SODIUM PHOSPHATE 10 MG/ML IJ SOLN
INTRAMUSCULAR | Status: DC | PRN
  Administered 2019-10-31: 8 mg via INTRAVENOUS

## 2019-10-31 MED ORDER — TRAMADOL HCL 50 MG PO TABS: 50.0000 mg | ORAL_TABLET | Freq: Four times a day (QID) | ORAL | 0 refills | Status: DC | PRN

## 2019-10-31 MED ORDER — ONDANSETRON HCL 4 MG/2ML IJ SOLN: 4.0000 mg | Freq: Four times a day (QID) | INTRAMUSCULAR | Status: DC | PRN

## 2019-10-31 MED ORDER — FENTANYL CITRATE (PF) 100 MCG/2ML IJ SOLN
INTRAMUSCULAR | Status: AC
  Filled 2019-10-31: qty 2

## 2019-10-31 MED ORDER — ONDANSETRON HCL 4 MG/2ML IJ SOLN
INTRAMUSCULAR | Status: AC
  Filled 2019-10-31: qty 2

## 2019-10-31 MED ORDER — FENTANYL CITRATE (PF) 100 MCG/2ML IJ SOLN
INTRAMUSCULAR | Status: DC | PRN
  Administered 2019-10-31 (×2): 25 ug via INTRAVENOUS

## 2019-10-31 MED ORDER — BUPIVACAINE-EPINEPHRINE (PF) 0.5% -1:200000 IJ SOLN
INTRAMUSCULAR | Status: AC
  Filled 2019-10-31: qty 30

## 2019-10-31 MED ORDER — LACTATED RINGERS IV SOLN: INTRAVENOUS | Status: DC

## 2019-10-31 MED ORDER — ACETAMINOPHEN 10 MG/ML IV SOLN
INTRAVENOUS | Status: DC | PRN
  Administered 2019-10-31: 1000 mg via INTRAVENOUS

## 2019-10-31 MED ORDER — DEXAMETHASONE SODIUM PHOSPHATE 10 MG/ML IJ SOLN
INTRAMUSCULAR | Status: AC
  Filled 2019-10-31: qty 1

## 2019-10-31 MED ORDER — OXYCODONE HCL 5 MG PO TABS: 5.0000 mg | ORAL_TABLET | Freq: Once | ORAL | Status: DC | PRN

## 2019-10-31 MED ORDER — FENTANYL CITRATE (PF) 100 MCG/2ML IJ SOLN: 25.0000 ug | INTRAMUSCULAR | Status: DC | PRN

## 2019-10-31 MED ORDER — LIDOCAINE 2% (20 MG/ML) 5 ML SYRINGE
INTRAMUSCULAR | Status: AC
  Filled 2019-10-31: qty 5

## 2019-10-31 MED ORDER — PHENYLEPHRINE HCL (PRESSORS) 10 MG/ML IV SOLN
INTRAVENOUS | Status: AC
  Filled 2019-10-31: qty 2

## 2019-10-31 SURGICAL SUPPLY — 36 items
BENZOIN TINCTURE PRP APPL 2/3 (GAUZE/BANDAGES/DRESSINGS) ×2 IMPLANT
BLADE HEX COATED 2.75 (ELECTRODE) ×2 IMPLANT
BLADE SURG 15 STRL LF DISP TIS (BLADE) ×1 IMPLANT
BLADE SURG 15 STRL SS (BLADE) ×2
BLADE SURG SZ10 CARB STEEL (BLADE) IMPLANT
CHLORAPREP W/TINT 26 (MISCELLANEOUS) ×2 IMPLANT
CLSR STERI-STRIP ANTIMIC 1/2X4 (GAUZE/BANDAGES/DRESSINGS) ×2 IMPLANT
COVER SURGICAL LIGHT HANDLE (MISCELLANEOUS) ×2 IMPLANT
COVER WAND RF STERILE (DRAPES) IMPLANT
DECANTER SPIKE VIAL GLASS SM (MISCELLANEOUS) IMPLANT
DRAPE LAPAROTOMY T 102X78X121 (DRAPES) ×2 IMPLANT
DRAPE LAPAROTOMY TRNSV 102X78 (DRAPES) IMPLANT
DRAPE SHEET LG 3/4 BI-LAMINATE (DRAPES) IMPLANT
ELECT REM PT RETURN 15FT ADLT (MISCELLANEOUS) ×2 IMPLANT
GAUZE SPONGE 4X4 12PLY STRL (GAUZE/BANDAGES/DRESSINGS) ×2 IMPLANT
GLOVE BIOGEL PI IND STRL 7.0 (GLOVE) ×1 IMPLANT
GLOVE BIOGEL PI INDICATOR 7.0 (GLOVE) ×1
GLOVE SURG ORTHO 8.0 STRL STRW (GLOVE) ×2 IMPLANT
GOWN STRL REUS W/TWL LRG LVL3 (GOWN DISPOSABLE) ×2 IMPLANT
GOWN STRL REUS W/TWL XL LVL3 (GOWN DISPOSABLE) ×4 IMPLANT
KIT BASIN (CUSTOM PROCEDURE TRAY) ×2 IMPLANT
KIT TURNOVER KIT A (KITS) IMPLANT
MARKER SKIN DUAL TIP RULER LAB (MISCELLANEOUS) IMPLANT
NEEDLE HYPO 25X1 1.5 SAFETY (NEEDLE) ×2 IMPLANT
NS IRRIG 1000ML POUR BTL (IV SOLUTION) ×2 IMPLANT
PACK BASIC VI WITH GOWN DISP (CUSTOM PROCEDURE TRAY) ×2 IMPLANT
PENCIL SMOKE EVACUATOR (MISCELLANEOUS) ×2 IMPLANT
SPONGE LAP 4X18 RFD (DISPOSABLE) ×4 IMPLANT
STAPLER VISISTAT 35W (STAPLE) IMPLANT
STRIP CLOSURE SKIN 1/2X4 (GAUZE/BANDAGES/DRESSINGS) ×2 IMPLANT
SUT MNCRL AB 4-0 PS2 18 (SUTURE) ×2 IMPLANT
SUT VIC AB 3-0 SH 18 (SUTURE) ×4 IMPLANT
SYR CONTROL 10ML LL (SYRINGE) ×2 IMPLANT
TAPE CLOTH SURG 4X10 WHT LF (GAUZE/BANDAGES/DRESSINGS) ×2 IMPLANT
TOWEL OR 17X26 10 PK STRL BLUE (TOWEL DISPOSABLE) ×2 IMPLANT
YANKAUER SUCT BULB TIP 10FT TU (MISCELLANEOUS) IMPLANT

## 2019-10-31 NOTE — H&P (Signed)
General Surgery [facility]  Sharen Hones Documented: 10/22/2019 2:25 PM Location: [place] [facility] Patient #: [id number] DOB: Apr 19, 1941 Married / Language: Bruce Cohen / Race: White Male   History of Present Illness Bruce Regal MD; 10/22/2019 2:39 PM) The patient is a 79 year old male who presents with a skin neoplasm.  CHIEF COMPLAINT: skin neoplasm, left upper chest wall  Patient is referred by Dr. Geanie Logan for surgical evaluation and management of a newly diagnosed skin neoplasm in the left upper anterior chest wall. Patient states that this is been present for many years and has waxed and waned in size. He has treated it with topical antibiotic ointment. Recently the lesion has become larger and more persistent. He was seen by his primary care physician and referred to our practice for surgical excision for definitive diagnosis and management. Patient states that he has had no other such lesions removed. He denies any axillary or cervical masses. Past medical history is significant for cardiac disease. Patient is on anticoagulation for atrial fibrillation. He is scheduled to see his cardiologist later this week.   Past Surgical History (Bruce Cohen, [place]; 10/22/2019 2:26 PM) Appendectomy  Oral Surgery  Spinal Surgery - Lower Back  Vasectomy   Allergies (Bruce Cohen, Bruce Cohen; 10/22/2019 2:26 PM) No Known Drug Allergies [10/22/2019]: Allergies Reconciled   Medication History (Bruce Cohen, Cohen; 10/22/2019 2:27 PM) Eliquis (5MG  Tablet, Oral) Active. Furosemide (40MG  Tablet, Oral) Active. Losartan Potassium (25MG  Tablet, Oral) Active. Colchicine (0.6MG  Tablet, Oral) Active. Folic Acid (1MG  Tablet, Oral) Active. Atorvastatin Calcium (40MG  Tablet, Oral) Active. Vitamin D3 (Oral) Specific strength unknown - Active. Medications Reconciled  Social History (Bruce Cohen, Bruce Cohen; 10/22/2019 2:26 PM) Alcohol use  Occasional  alcohol use. Caffeine use  Coffee. No drug use  Tobacco use  Former smoker.  Family History (Bruce Cohen, Bruce Cohen; 10/22/2019 2:26 PM) Family history unknown  First Degree Relatives   Other Problems (Bruce Cohen, Bruce Cohen; 10/22/2019 2:26 PM) Back Pain     Review of Systems (Bruce Cohen; 10/22/2019 2:26 PM) General Not Present- Appetite Loss, Chills, Fatigue, Fever, Night Sweats, Weight Gain and Weight Loss. Skin Present- Change in Wart/Mole, Dryness and New Lesions. Not Present- Hives, Jaundice, Non-Healing Wounds, Rash and Ulcer. HEENT Present- Hearing Loss and Wears glasses/contact lenses. Not Present- Earache, Hoarseness, Nose Bleed, Oral Ulcers, Ringing in the Ears, Seasonal Allergies, Sinus Pain, Sore Throat, Visual Disturbances and Yellow Eyes. Respiratory Not Present- Bloody sputum, Chronic Cough, Difficulty Breathing, Snoring and Wheezing. Breast Not Present- Breast Mass, Breast Pain, Nipple Discharge and Skin Changes. Cardiovascular Not Present- Chest Pain, Difficulty Breathing Lying Down, Leg Cramps, Palpitations, Rapid Heart Rate, Shortness of Breath and Swelling of Extremities. Gastrointestinal Not Present- Abdominal Pain, Bloating, Bloody Stool, Change in Bowel Habits, Chronic diarrhea, Constipation, Difficulty Swallowing, Excessive gas, Gets full quickly at meals, Hemorrhoids, Indigestion, Nausea, Rectal Pain and Vomiting. Male Genitourinary Not Present- Blood in Urine, Change in Urinary Stream, Frequency, Impotence, Nocturia, Painful Urination, Urgency and Urine Leakage.  Vitals (Bruce Cohen; 10/22/2019 2:27 PM) 10/22/2019 2:27 PM Weight: 198.2 lb Height: 75in Body Surface Area: 2.19 m Body Mass Index: 24.77 kg/m  Temp.: 97.28F  Pulse: 78 (Regular)  BP: 122/84 (Sitting, Left Arm, Standard)       Physical Exam Bruce Regal MD; 10/22/2019 2:41 PM) The physical exam findings are as follows: Note:GENERAL APPEARANCE Development:  normal Nutritional status: normal Gross deformities: none  SKIN Rash, lesions, ulcers:  On the anterior left upper chest wall just lateral to the midline is a raised irregular partially ulcerated lesion measuring 2.0 x 2.5 cm in size. This is mobile and nontender. There are no satellite lesions. Induration, erythema: none Nodules: none palpable  EYES Conjunctiva and lids: normal Pupils: equal and reactive Iris: normal bilaterally  EARS, NOSE, MOUTH, THROAT External ears: no lesion or deformity External nose: no lesion or deformity Hearing: grossly normal Due to Covid-19 pandemic, patient is wearing a mask.  NECK Symmetric: yes Trachea: midline Thyroid: no palpable nodules in the thyroid bed No palpable cervical or supraclavicular lymphadenopathy  CHEST Respiratory effort: normal Retraction or accessory muscle use: no Breath sounds: normal bilaterally Rales, rhonchi, wheeze: none  CARDIOVASCULAR Auscultation: Controlled rate Murmurs: none Pulses: radial pulse 2+ palpable Lower extremity edema: Mild bilateral  MUSCULOSKELETAL Station and gait: normal Digits and nails: no clubbing or cyanosis Muscle strength: grossly normal all extremities Range of motion: grossly normal all extremities Deformity: none  LYMPHATIC Cervical: none palpable Supraclavicular: none palpable Left axilla is free of adenopathy  PSYCHIATRIC Oriented to person, place, and time: yes Mood and affect: normal for situation Judgment and insight: appropriate for situation    Assessment & Plan Bruce Regal MD; 10/22/2019 2:44 PM) NEOPLASM OF SKIN OF CHEST (D49.2) Current Plans Patient is referred by his primary care physician for surgical evaluation and management of a skin neoplasm on the left anterior chest wall. Patient states this is been present for a number of years but has recently increased in size and been more persistent. On examination, there is a 2.5 cm lesion which is irregular  and partially ulcerated worrisome for malignancy. It does appear to be mobile. On examination there is no obvious lymphadenopathy.  I have recommended proceeding with excision of the lesion with primary closure. We discussed the size and location of the surgical incision. I explained that the actual incision will be considerably larger than the lesion itself in order for [place] to be able to primarily close the incision. We will likely do this under anesthesia as we will need to raise skin flaps circumferentially. Patient is on anticoagulation for atrial fibrillation. He is seeing his cardiologist later this week. We will ask for cardiac clearance. Patient likely will need to stop his anticoagulation for 3 days prior to the procedure and then restart his anticoagulation on the day following his procedure. I would arrange for this to be done in the main operating room as an outpatient procedure.  The risks and benefits of the procedure have been discussed at length with the patient. The patient understands the proposed procedure, potential alternative treatments, and the course of recovery to be expected. All of the patient's questions have been answered at this time. The patient wishes to proceed with surgery.  Armandina Gemma, MD [facility] Office: [phone]

## 2019-10-31 NOTE — Interval H&P Note (Signed)
History and Physical Interval Note:  10/31/2019 8:17 AM  Bruce Cohen  has presented today for surgery, with the diagnosis of SKIN NEOPLASM.  The various methods of treatment have been discussed with the patient and family. After consideration of risks, benefits and other options for treatment, the patient has consented to    Procedure(s): EXCISION SKIN NEOPLASM LEFT CHEST WALL (Left) as a surgical intervention.    The patient's history has been reviewed, patient examined, no change in status, stable for surgery.  I have reviewed the patient's chart and labs.  Questions were answered to the patient's satisfaction.    Armandina Gemma, MD [facility] Office: [place]

## 2019-10-31 NOTE — Anesthesia Preprocedure Evaluation (Signed)
Anesthesia Evaluation  Patient identified by MRN, date of birth, ID band Patient awake    Reviewed: Allergy & Precautions, H&P , NPO status , Patient's Chart, lab work & pertinent test results  History of Anesthesia Complications (+) PONV and history of anesthetic complications  Airway Mallampati: II   Neck ROM: full    Dental   Pulmonary sleep apnea , COPD, former smoker,    breath sounds clear to auscultation       Cardiovascular hypertension, + CAD and +CHF  + dysrhythmias Atrial Fibrillation + Valvular Problems/Murmurs MR  Rhythm:regular Rate:Normal     Neuro/Psych    GI/Hepatic   Endo/Other    Renal/GU Renal InsufficiencyRenal disease     Musculoskeletal   Abdominal   Peds  Hematology   Anesthesia Other Findings   Reproductive/Obstetrics                             Anesthesia Physical Anesthesia Plan  ASA: III  Anesthesia Plan: General   Post-op Pain Management:    Induction: Intravenous  PONV Risk Score and Plan: 3 and Ondansetron, Dexamethasone and Treatment may vary due to age or medical condition  Airway Management Planned: LMA  Additional Equipment:   Intra-op Plan:   Post-operative Plan: Extubation in OR  Informed Consent: I have reviewed the patients History and Physical, chart, labs and discussed the procedure including the risks, benefits and alternatives for the proposed anesthesia with the patient or authorized representative who has indicated his/her understanding and acceptance.       Plan Discussed with: CRNA, Anesthesiologist and Surgeon  Anesthesia Plan Comments:         Anesthesia Quick Evaluation

## 2019-10-31 NOTE — Transfer of Care (Signed)
Immediate Anesthesia Transfer of Care Note  Patient: Bruce Cohen  Procedure(s) Performed: EXCISION SKIN NEOPLASM LEFT UPPER CHEST WALL, 9X3 CM WITH LAYERED CLOSURE (Left )  Patient Location: PACU  Anesthesia Type:General  Level of Consciousness: awake, alert , oriented and patient cooperative  Airway & Oxygen Therapy: Patient Spontanous Breathing and Patient connected to face mask oxygen  Post-op Assessment: Report given to RN, Post -op Vital signs reviewed and stable and Patient moving all extremities  Post vital signs: Reviewed and stable  Last Vitals:  Vitals Value Taken Time  BP 128/77 10/31/19 0931  Temp    Pulse 56 10/31/19 0934  Resp 11 10/31/19 0934  SpO2 100 % 10/31/19 0934  Vitals shown include unvalidated device data.  Last Pain:  Vitals:   10/31/19 0658  TempSrc:   PainSc: 0-No pain         Complications: No apparent anesthesia complications

## 2019-10-31 NOTE — Anesthesia Procedure Notes (Signed)
Procedure Name: LMA Insertion Date/Time: 10/31/2019 8:36 AM Performed by: Victoriano Lain, CRNA Pre-anesthesia Checklist: Patient identified, Emergency Drugs available, Suction available, Patient being monitored and Timeout performed Patient Re-evaluated:Patient Re-evaluated prior to induction Oxygen Delivery Method: Circle system utilized Preoxygenation: Pre-oxygenation with 100% oxygen Induction Type: IV induction Ventilation: Mask ventilation without difficulty LMA: LMA with gastric port inserted Number of attempts: 1 Placement Confirmation: positive ETCO2 and breath sounds checked- equal and bilateral Tube secured with: Tape Dental Injury: Teeth and Oropharynx as per pre-operative assessment

## 2019-10-31 NOTE — Anesthesia Postprocedure Evaluation (Signed)
Anesthesia Post Note  Patient: Bruce Cohen  Procedure(s) Performed: EXCISION SKIN NEOPLASM LEFT UPPER CHEST WALL, 9X3 CM WITH LAYERED CLOSURE (Left )     Patient location during evaluation: PACU Anesthesia Type: General Level of consciousness: awake and alert and oriented Pain management: pain level controlled Vital Signs Assessment: post-procedure vital signs reviewed and stable Respiratory status: spontaneous breathing, nonlabored ventilation and respiratory function stable Cardiovascular status: blood pressure returned to baseline and stable Postop Assessment: no apparent nausea or vomiting Anesthetic complications: no    Last Vitals:  Vitals:   10/31/19 0945 10/31/19 0948  BP: 115/67   Pulse: (!) 55 (!) 54  Resp: 16 18  Temp:  36.6 C  SpO2: 99% 99%    Last Pain:  Vitals:   10/31/19 0948  TempSrc:   PainSc: 0-No pain                 [name] A.

## 2019-10-31 NOTE — Op Note (Signed)
Operative Note  Pre-operative Diagnosis:  Skin neoplasm left upper chest wall  Post-operative Diagnosis:  same  Surgeon:  Armandina Gemma, MD  Assistant:  none   Procedure:  Excision skin neoplasm left upper chest wall, skin and subcutaneous tissue, 9 x 3 cm, with layered closure  Anesthesia:  General (LMA)  Estimated Blood Loss:  minimal  Drains: none         Specimen: to pathology  Indications: Patient is a 79 year old male with an enlarging skin neoplasm on the left upper chest wall suspicious for squamous cell carcinoma.  Patient now comes to surgery for excision.  Procedure:  The patient was seen in the pre-op holding area. The risks, benefits, complications, treatment options, and expected outcomes were previously discussed with the patient. The patient agreed with the proposed plan and has signed the informed consent form.  The patient was brought to the operating room by the surgical team, identified as [name] and the procedure verified. A "time out" was completed and the above information confirmed.  Following administration of general anesthesia, the patient is prepped and draped in the usual aseptic fashion.  After ascertaining that an adequate level of anesthesia been achieved, an elliptical incision is made around the neoplasm on the left upper chest wall.  Dissection is carried full-thickness into the deep subcutaneous tissues down to the level of the platysma.  The entire lesion is excised.  Sutures are used for orientation purposes for the specimen.  The specimen is submitted to pathology for review.  Hemostasis is achieved with the electrocautery.  Local anesthetic is infiltrated circumferentially.  Skin flaps are then developed just above the level of the platysma using the electrocautery for hemostasis.  Wound is then closed in layers with interrupted 3-0 Vicryl sutures in the subcutaneous tissues followed by a running 4-0 Monocryl subcuticular suture.  Wound is  washed and dried and benzoin and Steri-Strips are applied.  Sterile gauze dressings are applied.  Patient is awakened from anesthesia and brought to the recovery room in stable condition.  The patient tolerated the procedure well.   Armandina Gemma, MD [facility] Office: [phone]

## 2019-11-02 ENCOUNTER — Other Ambulatory Visit: Payer: Self-pay | Admitting: Interventional Cardiology

## 2019-11-04 LAB — SURGICAL PATHOLOGY

## 2019-11-13 ENCOUNTER — Other Ambulatory Visit: Payer: Self-pay | Admitting: Interventional Cardiology

## 2020-01-09 ENCOUNTER — Other Ambulatory Visit: Payer: Self-pay

## 2020-01-09 ENCOUNTER — Emergency Department (HOSPITAL_COMMUNITY): Payer: Medicare Other

## 2020-01-09 ENCOUNTER — Inpatient Hospital Stay (HOSPITAL_COMMUNITY)
Admission: EM | Admit: 2020-01-09 | Discharge: 2020-01-16 | DRG: 871 | Disposition: A | Discharge status: Home Health | Payer: Medicare Other | Attending: Internal Medicine | Admitting: Internal Medicine

## 2020-01-09 ENCOUNTER — Encounter (HOSPITAL_COMMUNITY): Payer: Self-pay

## 2020-01-09 ENCOUNTER — Inpatient Hospital Stay (HOSPITAL_COMMUNITY): Payer: Medicare Other

## 2020-01-09 DIAGNOSIS — J90 Pleural effusion, not elsewhere classified: Secondary | ICD-10-CM

## 2020-01-09 DIAGNOSIS — I2510 Atherosclerotic heart disease of native coronary artery without angina pectoris: Secondary | ICD-10-CM | POA: Diagnosis present

## 2020-01-09 DIAGNOSIS — R5381 Other malaise: Secondary | ICD-10-CM | POA: Diagnosis present

## 2020-01-09 DIAGNOSIS — E871 Hypo-osmolality and hyponatremia: Secondary | ICD-10-CM | POA: Diagnosis not present

## 2020-01-09 DIAGNOSIS — E876 Hypokalemia: Secondary | ICD-10-CM | POA: Diagnosis present

## 2020-01-09 DIAGNOSIS — B9620 Unspecified Escherichia coli [E. coli] as the cause of diseases classified elsewhere: Secondary | ICD-10-CM | POA: Diagnosis not present

## 2020-01-09 DIAGNOSIS — I7121 Aneurysm of the ascending aorta, without rupture: Secondary | ICD-10-CM | POA: Diagnosis present

## 2020-01-09 DIAGNOSIS — Z87891 Personal history of nicotine dependence: Secondary | ICD-10-CM | POA: Diagnosis not present

## 2020-01-09 DIAGNOSIS — E872 Acidosis, unspecified: Secondary | ICD-10-CM | POA: Diagnosis present

## 2020-01-09 DIAGNOSIS — I420 Dilated cardiomyopathy: Secondary | ICD-10-CM | POA: Diagnosis present

## 2020-01-09 DIAGNOSIS — D689 Coagulation defect, unspecified: Secondary | ICD-10-CM | POA: Diagnosis present

## 2020-01-09 DIAGNOSIS — Z7901 Long term (current) use of anticoagulants: Secondary | ICD-10-CM

## 2020-01-09 DIAGNOSIS — I10 Essential (primary) hypertension: Secondary | ICD-10-CM | POA: Diagnosis present

## 2020-01-09 DIAGNOSIS — I130 Hypertensive heart and chronic kidney disease with heart failure and stage 1 through stage 4 chronic kidney disease, or unspecified chronic kidney disease: Secondary | ICD-10-CM

## 2020-01-09 DIAGNOSIS — R809 Proteinuria, unspecified: Secondary | ICD-10-CM | POA: Diagnosis present

## 2020-01-09 DIAGNOSIS — N390 Urinary tract infection, site not specified: Secondary | ICD-10-CM | POA: Diagnosis present

## 2020-01-09 DIAGNOSIS — D6869 Other thrombophilia: Secondary | ICD-10-CM | POA: Diagnosis not present

## 2020-01-09 DIAGNOSIS — I4819 Other persistent atrial fibrillation: Secondary | ICD-10-CM | POA: Diagnosis present

## 2020-01-09 DIAGNOSIS — I440 Atrioventricular block, first degree: Secondary | ICD-10-CM | POA: Diagnosis present

## 2020-01-09 DIAGNOSIS — R739 Hyperglycemia, unspecified: Secondary | ICD-10-CM | POA: Diagnosis present

## 2020-01-09 DIAGNOSIS — N5089 Other specified disorders of the male genital organs: Secondary | ICD-10-CM | POA: Diagnosis present

## 2020-01-09 DIAGNOSIS — I480 Paroxysmal atrial fibrillation: Secondary | ICD-10-CM | POA: Diagnosis present

## 2020-01-09 DIAGNOSIS — J449 Chronic obstructive pulmonary disease, unspecified: Secondary | ICD-10-CM | POA: Diagnosis present

## 2020-01-09 DIAGNOSIS — I5023 Acute on chronic systolic (congestive) heart failure: Secondary | ICD-10-CM | POA: Diagnosis present

## 2020-01-09 DIAGNOSIS — R32 Unspecified urinary incontinence: Secondary | ICD-10-CM | POA: Diagnosis present

## 2020-01-09 DIAGNOSIS — I5022 Chronic systolic (congestive) heart failure: Secondary | ICD-10-CM | POA: Diagnosis not present

## 2020-01-09 DIAGNOSIS — Z91199 Patient's noncompliance with other medical treatment and regimen due to unspecified reason: Secondary | ICD-10-CM

## 2020-01-09 DIAGNOSIS — B999 Unspecified infectious disease: Secondary | ICD-10-CM | POA: Diagnosis not present

## 2020-01-09 DIAGNOSIS — N179 Acute kidney failure, unspecified: Secondary | ICD-10-CM | POA: Diagnosis present

## 2020-01-09 DIAGNOSIS — E782 Mixed hyperlipidemia: Secondary | ICD-10-CM | POA: Diagnosis present

## 2020-01-09 DIAGNOSIS — R7881 Bacteremia: Secondary | ICD-10-CM | POA: Diagnosis present

## 2020-01-09 DIAGNOSIS — I517 Cardiomegaly: Secondary | ICD-10-CM | POA: Diagnosis present

## 2020-01-09 DIAGNOSIS — N492 Inflammatory disorders of scrotum: Secondary | ICD-10-CM | POA: Diagnosis present

## 2020-01-09 DIAGNOSIS — N1831 Chronic kidney disease, stage 3a: Secondary | ICD-10-CM | POA: Diagnosis present

## 2020-01-09 DIAGNOSIS — D638 Anemia in other chronic diseases classified elsewhere: Secondary | ICD-10-CM | POA: Diagnosis present

## 2020-01-09 DIAGNOSIS — A419 Sepsis, unspecified organism: Secondary | ICD-10-CM | POA: Diagnosis not present

## 2020-01-09 DIAGNOSIS — A4151 Sepsis due to Escherichia coli [E. coli]: Principal | ICD-10-CM | POA: Diagnosis present

## 2020-01-09 DIAGNOSIS — E860 Dehydration: Secondary | ICD-10-CM | POA: Diagnosis present

## 2020-01-09 DIAGNOSIS — I428 Other cardiomyopathies: Secondary | ICD-10-CM

## 2020-01-09 DIAGNOSIS — E8809 Other disorders of plasma-protein metabolism, not elsewhere classified: Secondary | ICD-10-CM | POA: Diagnosis not present

## 2020-01-09 DIAGNOSIS — R7989 Other specified abnormal findings of blood chemistry: Secondary | ICD-10-CM | POA: Diagnosis present

## 2020-01-09 DIAGNOSIS — Z20822 Contact with and (suspected) exposure to covid-19: Secondary | ICD-10-CM | POA: Diagnosis present

## 2020-01-09 DIAGNOSIS — R6520 Severe sepsis without septic shock: Secondary | ICD-10-CM | POA: Diagnosis present

## 2020-01-09 DIAGNOSIS — Z79899 Other long term (current) drug therapy: Secondary | ICD-10-CM

## 2020-01-09 DIAGNOSIS — D6859 Other primary thrombophilia: Secondary | ICD-10-CM | POA: Diagnosis present

## 2020-01-09 DIAGNOSIS — J849 Interstitial pulmonary disease, unspecified: Secondary | ICD-10-CM | POA: Diagnosis present

## 2020-01-09 DIAGNOSIS — D72829 Elevated white blood cell count, unspecified: Secondary | ICD-10-CM | POA: Diagnosis present

## 2020-01-09 DIAGNOSIS — E861 Hypovolemia: Secondary | ICD-10-CM | POA: Diagnosis present

## 2020-01-09 DIAGNOSIS — I4510 Unspecified right bundle-branch block: Secondary | ICD-10-CM | POA: Diagnosis present

## 2020-01-09 LAB — URINALYSIS, ROUTINE W REFLEX MICROSCOPIC
Bilirubin Urine: NEGATIVE
Glucose, UA: NEGATIVE mg/dL
Ketones, ur: NEGATIVE mg/dL
Nitrite: POSITIVE — AB
Protein, ur: 30 mg/dL — AB
Specific Gravity, Urine: 1.016 (ref 1.005–1.030)
WBC, UA: >50 WBC/hpf — ABNORMAL HIGH (ref 0–5)
pH: 5 (ref 5.0–8.0)

## 2020-01-09 LAB — CBC WITH DIFFERENTIAL/PLATELET
Abs Immature Granulocytes: 0.32 10*3/uL — ABNORMAL HIGH (ref 0.00–0.07)
Basophils Absolute: 0.1 10*3/uL (ref 0.0–0.1)
Basophils Relative: 0 %
Eosinophils Absolute: 0.3 10*3/uL (ref 0.0–0.5)
Eosinophils Relative: 1 %
HCT: 35.5 % — ABNORMAL LOW (ref 39.0–52.0)
Hemoglobin: 12 g/dL — ABNORMAL LOW (ref 13.0–17.0)
Immature Granulocytes: 1 %
Lymphocytes Relative: 2 %
Lymphs Abs: 0.4 10*3/uL — ABNORMAL LOW (ref 0.7–4.0)
MCH: 32.3 pg (ref 26.0–34.0)
MCHC: 33.8 g/dL (ref 30.0–36.0)
MCV: 95.7 fL (ref 80.0–100.0)
Monocytes Absolute: 0.7 10*3/uL (ref 0.1–1.0)
Monocytes Relative: 3 %
Neutro Abs: 20.5 10*3/uL — ABNORMAL HIGH (ref 1.7–7.7)
Neutrophils Relative %: 93 %
Platelets: 246 10*3/uL (ref 150–400)
RBC: 3.71 MIL/uL — ABNORMAL LOW (ref 4.22–5.81)
RDW: 13.4 % (ref 11.5–15.5)
WBC: 22.3 10*3/uL — ABNORMAL HIGH (ref 4.0–10.5)
nRBC: 0 % (ref 0.0–0.2)

## 2020-01-09 LAB — LACTIC ACID, PLASMA
Lactic Acid, Venous: 2.6 mmol/L (ref 0.5–1.9)
Lactic Acid, Venous: 1.8 mmol/L (ref 0.5–1.9)

## 2020-01-09 LAB — PROTIME-INR
INR: 1.5 — ABNORMAL HIGH (ref 0.8–1.2)
Prothrombin Time: 17.9 seconds — ABNORMAL HIGH (ref 11.4–15.2)

## 2020-01-09 LAB — BRAIN NATRIURETIC PEPTIDE: B Natriuretic Peptide: 869.9 pg/mL — ABNORMAL HIGH (ref 0.0–100.0)

## 2020-01-09 LAB — COMPREHENSIVE METABOLIC PANEL
ALT: 21 U/L (ref 0–44)
AST: 23 U/L (ref 15–41)
Albumin: 2.6 g/dL — ABNORMAL LOW (ref 3.5–5.0)
Alkaline Phosphatase: 111 U/L (ref 38–126)
Anion gap: 12 (ref 5–15)
BUN: 38 mg/dL — ABNORMAL HIGH (ref 8–23)
CO2: 19 mmol/L — ABNORMAL LOW (ref 22–32)
Calcium: 8.4 mg/dL — ABNORMAL LOW (ref 8.9–10.3)
Chloride: 102 mmol/L (ref 98–111)
Creatinine, Ser: 1.78 mg/dL — ABNORMAL HIGH (ref 0.61–1.24)
GFR calc Af Amer: 41 mL/min — ABNORMAL LOW (ref >60)
GFR calc non Af Amer: 35 mL/min — ABNORMAL LOW (ref >60)
Glucose, Bld: 113 mg/dL — ABNORMAL HIGH (ref 70–99)
Potassium: 3.4 mmol/L — ABNORMAL LOW (ref 3.5–5.1)
Sodium: 133 mmol/L — ABNORMAL LOW (ref 135–145)
Total Bilirubin: 0.7 mg/dL (ref 0.3–1.2)
Total Protein: 6.4 g/dL — ABNORMAL LOW (ref 6.5–8.1)

## 2020-01-09 LAB — SARS CORONAVIRUS 2 BY RT PCR (HOSPITAL ORDER, PERFORMED IN ~~LOC~~ HOSPITAL LAB): SARS Coronavirus 2: NEGATIVE

## 2020-01-09 LAB — APTT: aPTT: 45 seconds — ABNORMAL HIGH (ref 24–36)

## 2020-01-09 MED ORDER — SODIUM CHLORIDE 0.9 % IV SOLN: 1.0000 g | INTRAVENOUS | Status: DC

## 2020-01-09 MED ORDER — SODIUM CHLORIDE 0.9 % IV BOLUS
1000.0000 mL | Freq: Once | INTRAVENOUS | Status: AC
  Administered 2020-01-09: 1000 mL via INTRAVENOUS

## 2020-01-09 MED ORDER — PREDNISONE 20 MG PO TABS: 10.0000 mg | ORAL_TABLET | ORAL | Status: DC

## 2020-01-09 MED ORDER — ADULT MULTIVITAMIN W/MINERALS CH
1.0000 | ORAL_TABLET | Freq: Every day | ORAL | Status: DC
  Administered 2020-01-09 – 2020-01-16 (×8): 1 via ORAL
  Filled 2020-01-09 (×8): qty 1

## 2020-01-09 MED ORDER — LACTATED RINGERS IV BOLUS
500.0000 mL | Freq: Once | INTRAVENOUS | Status: AC
  Administered 2020-01-09: 500 mL via INTRAVENOUS

## 2020-01-09 MED ORDER — ACETAMINOPHEN 650 MG RE SUPP: 650.0000 mg | Freq: Four times a day (QID) | RECTAL | Status: DC | PRN

## 2020-01-09 MED ORDER — POTASSIUM CHLORIDE CRYS ER 20 MEQ PO TBCR
40.0000 meq | EXTENDED_RELEASE_TABLET | Freq: Once | ORAL | Status: AC
  Administered 2020-01-09: 40 meq via ORAL
  Filled 2020-01-09: qty 2

## 2020-01-09 MED ORDER — VITAMIN D 25 MCG (1000 UNIT) PO TABS
1000.0000 [IU] | ORAL_TABLET | Freq: Every day | ORAL | Status: DC
  Administered 2020-01-09: 1000 [IU] via ORAL
  Filled 2020-01-09 (×2): qty 1

## 2020-01-09 MED ORDER — SODIUM CHLORIDE 0.9 % IV SOLN: INTRAVENOUS | Status: DC

## 2020-01-09 MED ORDER — TRAMADOL HCL 50 MG PO TABS
50.0000 mg | ORAL_TABLET | Freq: Four times a day (QID) | ORAL | Status: DC | PRN
  Administered 2020-01-10: 50 mg via ORAL
  Administered 2020-01-10 – 2020-01-11 (×2): 100 mg via ORAL
  Administered 2020-01-12: 50 mg via ORAL
  Filled 2020-01-09: qty 2
  Filled 2020-01-09 (×2): qty 1
  Filled 2020-01-09: qty 2

## 2020-01-09 MED ORDER — APIXABAN 5 MG PO TABS
5.0000 mg | ORAL_TABLET | Freq: Two times a day (BID) | ORAL | Status: DC
  Administered 2020-01-09 – 2020-01-16 (×14): 5 mg via ORAL
  Filled 2020-01-09 (×15): qty 1

## 2020-01-09 MED ORDER — AMIODARONE HCL 200 MG PO TABS
200.0000 mg | ORAL_TABLET | ORAL | Status: DC
  Administered 2020-01-09 – 2020-01-16 (×6): 200 mg via ORAL
  Filled 2020-01-09 (×6): qty 1

## 2020-01-09 MED ORDER — ACETAMINOPHEN 500 MG PO TABS
1000.0000 mg | ORAL_TABLET | Freq: Once | ORAL | Status: AC
  Administered 2020-01-09: 1000 mg via ORAL
  Filled 2020-01-09: qty 2

## 2020-01-09 MED ORDER — ACETAMINOPHEN 325 MG PO TABS
650.0000 mg | ORAL_TABLET | Freq: Four times a day (QID) | ORAL | Status: DC | PRN
  Administered 2020-01-10 – 2020-01-16 (×8): 650 mg via ORAL
  Filled 2020-01-09 (×8): qty 2

## 2020-01-09 MED ORDER — AMIODARONE HCL 200 MG PO TABS
100.0000 mg | ORAL_TABLET | ORAL | Status: DC
  Administered 2020-01-11 – 2020-01-12 (×2): 100 mg via ORAL
  Filled 2020-01-09 (×2): qty 1

## 2020-01-09 MED ORDER — SODIUM CHLORIDE 0.9 % IV SOLN
2.0000 g | INTRAVENOUS | Status: DC
  Administered 2020-01-09 – 2020-01-14 (×6): 2 g via INTRAVENOUS
  Filled 2020-01-09 (×2): qty 20
  Filled 2020-01-09: qty 0.1
  Filled 2020-01-09 (×2): qty 20
  Filled 2020-01-09: qty 0.1
  Filled 2020-01-09: qty 20

## 2020-01-09 NOTE — Progress Notes (Signed)
PHARMACY NOTE:  ANTIMICROBIAL DOSAGE ADJUSTMENT  Current antimicrobial regimen includes a mismatch between antimicrobial dosage and estimated renal function.  As per policy approved by the Pharmacy & Therapeutics and Medical Executive Committees, the antimicrobial dosage will be adjusted accordingly.  Current antimicrobial dosage:  Ceftriaxone 1g IV q24h  Indication: UTI/sepsis    Antimicrobial dosage has been changed to:  Ceftriaxone 2g IV q24h  Additional comments: increase ceftriaxone dose in code sepsis patient   Arturo Morton, PharmD, BCPS Please check AMION for all [place] contact numbers Clinical Pharmacist 01/09/2020 12:43 PM

## 2020-01-09 NOTE — H&P (Signed)
History and Physical    BRODRICK Cohen GGY:[id number] DOB: [birth year] DOA: 01/09/2020  PCP: Josetta Huddle, MD (Confirm with patient/family/NH records and if not entered, this has to be entered at [facility] point of entry) Patient coming from: Home  I have personally briefly reviewed patient's old medical records in [place]  Chief Complaint: Dysuria  HPI: Bruce Cohen is a 79 y.o. male with medical history significant of CAD, chronic systolic CHF, cardiomyopathy, paroxysmal A. fib, COPD, hypertension, hyperlipidemia, thoracic aneurysm, presented with dysuria.  Symptoms started 3 days ago, with urinary frequency, urine incontinent, lower abdominal discomfort, episodes of chills but no fever, no nauseous vomiting.   ED Course: BP borderline, but stabilized after IV bolus. UA showed UTI pic.  Review of Systems: As per HPI otherwise 10 point review of systems negative.    Past Medical History:  Diagnosis Date  . CAD (coronary artery disease)    a. LHC 3/16: pLAD 20, mLAD 20, pRCA 20, EF 25%  . CHF (congestive heart failure) (HCC)    hx of acute systolic hf- 0350  . Dysrhythmia    afib   . Heart murmur    since birth   . History of PFTs    a. PFTs 8/17: FEV1 88% predicted; FEV1/FVC 81%, corr DLCO 57%  . Hyperlipidemia   . Hypertension   . LVH (left ventricular hypertrophy)   . Mitral regurgitation   . NICM (nonischemic cardiomyopathy) ([place])    Tachy mediated 2/2 AF with RVR >> a. Echo 3/16: EF 30-35% >> b. Echo 6/16:  EF 50% with mild apical HK, mild LVH, Gr 2 DD, mild MR, severe BAE, PASP 43 mmHg  . OSA (obstructive sleep apnea) 02/12/2015   Mild to moderate with AHI 14.7/hr no cpap or oxygen   . PAF (paroxysmal atrial fibrillation) ([place]) 09/2014   CHADS2-VASc=5 >> Eliquis // 6/17: Amiodarone started  . Pericarditis    a. Hospitalized in [place]. Episode in 1998. Had a cath which was clean (also in 1990s). 2 further episodes that were mild.  Marland Kitchen PONV (postoperative nausea and  vomiting)   . Thoracic ascending aortic aneurysm ([place]) 04/26/2016   High res CT 9/17: 4.0 cm ascending thoracic aortic aneurysm // Chest CTA 9/18: ascending aorta 3.9 cm, CAD, benign thyroid nodules, stable lung reticular changes, aortic atherosclerosis    Past Surgical History:  Procedure Laterality Date  . APPENDECTOMY    . BACK SURGERY    . CARDIAC CATHETERIZATION  1998   "clean"  . CARDIOVERSION N/A 11/21/2014   Procedure: CARDIOVERSION;  Surgeon: Fay Records, MD;  Location[facility] ENDOSCOPY;  Service: Cardiovascular;  Laterality: N/A;  . CARDIOVERSION N/A 10/22/2015   Procedure: CARDIOVERSION;  Surgeon: Herminio Commons, MD;  Location: [place];  Service: Cardiovascular;  Laterality: N/A;  . FRACTURE SURGERY    . LEFT HEART CATHETERIZATION WITH CORONARY ANGIOGRAM N/A 10/01/2014   Procedure: LEFT HEART CATHETERIZATION WITH CORONARY ANGIOGRAM;  Surgeon: Wellington Hampshire, MD;  Location: [place] CATH LAB;  Service: Cardiovascular;  Laterality: N/A;  . LESION REMOVAL Left 10/31/2019   Procedure: EXCISION SKIN NEOPLASM LEFT UPPER CHEST WALL, 9X3 CM WITH LAYERED CLOSURE;  Surgeon: Armandina Gemma, MD;  Location: [facility] ORS;  Service: General;  Laterality: Left;  . LUMBAR LAMINECTOMY  1970's   "partial"  . ORIF FINGER / THUMB FRACTURE Left ~ 2006   "thumb"  . TONSILLECTOMY       reports that he quit smoking about 21 years ago. His  smoking use included cigarettes. He has a 15.00 pack-year smoking history. He has never used smokeless tobacco. He reports current alcohol use. He reports that he does not use drugs.  No Known Allergies  Family History  Problem Relation Age of Onset  . Heart disease Mother        VALVE SURGERY  . Stroke Mother   . Heart failure Father   . CAD Neg Hx   . Heart attack Neg Hx      Prior to Admission medications   Medication Sig Start Date End Date Taking? Authorizing Provider  amiodarone (PACERONE) 200 MG tablet TAKE 1 TABLET EVERY DAY AND 1/2 TABLET ON SATURDAY  AND SUNDAY Patient taking differently: Take 100-200 mg by mouth See admin instructions.  11/04/19  Yes Bruce Booze, MD  amLODipine (NORVASC) 5 MG tablet TAKE 2 TABLETS BY MOUTH EVERY DAY Patient taking differently: Take 10 mg by mouth daily.  08/07/19  Yes Bruce Booze, MD  apixaban (ELIQUIS) 5 MG TABS tablet Take 1 tablet (5 mg total) by mouth 2 (two) times daily. 11/19/14  Yes Weaver, Scott T, PA-C  cholecalciferol (VITAMIN D3) 25 MCG (1000 UNIT) tablet Take 1,000 Units by mouth daily.   Yes [provider]  furosemide (LASIX) 40 MG tablet Take 1 tablet (40 mg total) by mouth daily. 11/14/19  Yes Bruce Booze, MD  losartan (COZAAR) 25 MG tablet Take 2 tablets (50 mg total) by mouth daily. PLEASE SCHEDULE AN APPT FOR FURTHER REFILLS 1ST ATTEMPT [phone] 10/14/19  Yes Bruce Booze, MD  Multiple Vitamin (MULTIVITAMIN WITH MINERALS) TABS tablet Take 1 tablet by mouth daily.   Yes [provider]  predniSONE (DELTASONE) 10 MG tablet Take 10 mg by mouth as directed. Took 2 tabs (20 mg) for 3 days. He restarted an old Rx   Yes [provider]  traMADol (ULTRAM) 50 MG tablet Take 1-2 tablets (50-100 mg total) by mouth every 6 (six) hours as needed. 10/31/19  Yes Armandina Gemma, MD    Physical Exam: Vitals:   01/09/20 1146 01/09/20 1211  BP: (!) 111/57   Pulse: 76   Resp: 16   Temp: 100.1 F (37.8 C)   TempSrc: Oral   SpO2: 97%   Weight: 89.8 kg 89.8 kg  Height: 6\' 3"  (1.905 m) 6\' 3"  (1.905 m)    Constitutional: NAD, calm, comfortable Vitals:   01/09/20 1146 01/09/20 1211  BP: (!) 111/57   Pulse: 76   Resp: 16   Temp: 100.1 F (37.8 C)   TempSrc: Oral   SpO2: 97%   Weight: 89.8 kg 89.8 kg  Height: 6\' 3"  (1.905 m) 6\' 3"  (1.905 m)   Eyes: PERRL, lids and conjunctivae normal ENMT: Mucous membranes are dry. Posterior pharynx clear of any exudate or lesions.Normal dentition.  Neck: normal, supple, no masses, no  thyromegaly Respiratory: clear to auscultation bilaterally, no wheezing, no crackles. Normal respiratory effort. No accessory muscle use.  Cardiovascular: Regular rate and rhythm, no murmurs / rubs / gallops. No extremity edema. 2+ pedal pulses. No carotid bruits.  Abdomen: no tenderness, no masses palpated. No hepatosplenomegaly. Bowel sounds positive.  Musculoskeletal: no clubbing / cyanosis. No joint deformity upper and lower extremities. Good ROM, no contractures. Normal muscle tone.  Skin: rash on scrotum Neurologic: CN 2-12 grossly intact. Sensation intact, DTR normal. Strength 5/5 in all 4.  Psychiatric: Normal judgment and insight. Alert and oriented x 3. Normal mood.     Labs on Admission:  I have personally reviewed following labs and imaging studies  CBC: Recent Labs  Lab 01/09/20 1244  WBC 22.3*  NEUTROABS 20.5*  HGB 12.0*  HCT 35.5*  MCV 95.7  PLT 619   Basic Metabolic Panel: Recent Labs  Lab 01/09/20 1244  NA 133*  K 3.4*  CL 102  CO2 19*  GLUCOSE 113*  BUN 38*  CREATININE 1.78*  CALCIUM 8.4*   GFR: Estimated Creatinine Clearance: 40.2 mL/min (A) (by C-G formula based on SCr of 1.78 mg/dL (H)). Liver Function Tests: Recent Labs  Lab 01/09/20 1244  AST 23  ALT 21  ALKPHOS 111  BILITOT 0.7  PROT 6.4*  ALBUMIN 2.6*   No results for input(s): LIPASE, AMYLASE in the last 168 hours. No results for input(s): AMMONIA in the last 168 hours. Coagulation Profile: Recent Labs  Lab 01/09/20 1244  INR 1.5*   Cardiac Enzymes: No results for input(s): CKTOTAL, CKMB, CKMBINDEX, TROPONINI in the last 168 hours. BNP (last 3 results) No results for input(s): PROBNP in the last 8760 hours. HbA1C: No results for input(s): HGBA1C in the last 72 hours. CBG: No results for input(s): GLUCAP in the last 168 hours. Lipid Profile: No results for input(s): CHOL, HDL, LDLCALC, TRIG, CHOLHDL, LDLDIRECT in the last 72 hours. Thyroid Function Tests: No results for  input(s): TSH, T4TOTAL, FREET4, T3FREE, THYROIDAB in the last 72 hours. Anemia Panel: No results for input(s): VITAMINB12, FOLATE, FERRITIN, TIBC, IRON, RETICCTPCT in the last 72 hours. Urine analysis:    Component Value Date/Time   COLORURINE YELLOW 01/09/2020 1234   APPEARANCEUR CLOUDY (A) 01/09/2020 1234   LABSPEC 1.016 01/09/2020 1234   PHURINE 5.0 01/09/2020 1234   GLUCOSEU NEGATIVE 01/09/2020 1234   HGBUR SMALL (A) 01/09/2020 1234   BILIRUBINUR NEGATIVE 01/09/2020 1234   KETONESUR NEGATIVE 01/09/2020 1234   PROTEINUR 30 (A) 01/09/2020 1234   NITRITE POSITIVE (A) 01/09/2020 1234   LEUKOCYTESUR LARGE (A) 01/09/2020 1234    Radiological Exams on Admission: DG Chest Port 1 View  Result Date: 01/09/2020 CLINICAL DATA:  Shortness of breath. EXAM: PORTABLE CHEST 1 VIEW COMPARISON:  10/05/2015. FINDINGS: Cardiomegaly. No pulmonary venous congestion. Mild bilateral interstitial prominence noted. A component these changes may be chronic. Active interstitial process including interstitial edema and/or pneumonitis cannot be excluded. No pleural effusion or pneumothorax. IMPRESSION: Cardiomegaly. Mild bilateral interstitial prominence noted. Component these changes may be chronic, an active interstitial process including interstitial edema and/or pneumonitis cannot be excluded. Electronically Signed   By: Marcello Moores  Register   On: 01/09/2020 13:22    EKG: Independently reviewed.Chronic RBBB  Assessment/Plan Active Problems:   Acute lower UTI   Sepsis ([place])  (please populate well all problems here in Problem List. (For example, if patient is on BP meds at home and you resume or decide to hold them, it is a problem that needs to be her. Same for CAD, COPD, HLD and so on)  Sepsis 2/2 UTI -Received IV process, blood pressure borderline, will hold her diuresis and BP meds tonight, continue maintenance IVF and monitor volume status -Continue ceftriaxone, urine pending -PVR and renal  ultrasound  Chronic systolic CHF -Hypovolemic, continue maintenance IV fluids and monitor on volume status - Hold Lasix for today  HTN -BP meds until out of sepsis  Scrotum cellulitis -Likely from urine irritation, covered with ceftriaxone for UTI, will consult wound care  Hypokalemia -Replace and recheck  Paroxysmal A. Fib -No acute issue, continue amiodarone and Eliquis  AKI on CKD stage III -  Secondary to sepsis, will check renal ultrasound  DVT prophylaxis: Heparin subcu Code Status: Full code Family Communication: None at bedside Disposition Plan: Depends on clinical progress may need 2 to 3 days hospital stay Consults called: None Admission status: Tele admit   Lequita Halt MD [facility] Pager [phone]    01/09/2020, 5:35 PM

## 2020-01-09 NOTE — Progress Notes (Signed)
Received patient from ED, AOx4, denies pain, VSS with soft BP at 92/52, PR 53, oriented to room, bed controls, call light and plan of care.  Gave ice water to drink , placed on telemtery monitor and primofit.  Will monitor.

## 2020-01-09 NOTE — ED Provider Notes (Signed)
[place] EMERGENCY DEPARTMENT Provider Note   CSN: [id number] Arrival date & time: 01/09/20  1143     History Chief Complaint  Patient presents with  . Shortness of Breath    Bruce Cohen is a 79 y.o. male.  Patient is a 79 year old male with a history of CAD, CHF, paroxysmal atrial fibrillation, cardiomyopathy, thoracic ascending aortic aneurysm at 4.0 cm, COPD, CKD, ongoing tobacco abuse who presents today with 3 to 4-day history of dysuria, frequency, urgency, bilateral flank pain that is waxing and waning, fever and shortness of breath.  Patient reports that sometimes he will be able to urinate but other times it will just leak urine and he is having increased chills, rigors and weakness.  He is only been eating 1 meal a day but denies any nausea or vomiting.  Patient reports once a day he has been taking an antibiotic that he had at home but cannot remember the name of it.  However his symptoms are only worsening.  Today when his son came over patient was unable to get out of bed and they called the ambulance.  Speaking with his son on the phone he reports that for the last 3 days he has been trying to take him to the doctor but his father refused until today.  Patient denies having productive cough but has felt more short of breath over the last few days.  Son is concerned that he is not been taking his Lasix.  Patient denies any chest pain.  He currently has no pain but states he will get intermittent cramping pain in his bilateral flanks.  The history is provided by the patient.  Shortness of Breath      Past Medical History:  Diagnosis Date  . CAD (coronary artery disease)    a. LHC 3/16: pLAD 20, mLAD 20, pRCA 20, EF 25%  . CHF (congestive heart failure) (HCC)    hx of acute systolic hf- [id number]  . Dysrhythmia    afib   . Heart murmur    since birth   . History of PFTs    a. PFTs 8/17: FEV1 88% predicted; FEV1/FVC 81%, corr DLCO 57%  . Hyperlipidemia    . Hypertension   . LVH (left ventricular hypertrophy)   . Mitral regurgitation   . NICM (nonischemic cardiomyopathy) ([place])    Tachy mediated 2/2 AF with RVR >> a. Echo 3/16: EF 30-35% >> b. Echo 6/16:  EF 50% with mild apical HK, mild LVH, Gr 2 DD, mild MR, severe BAE, PASP 43 mmHg  . OSA (obstructive sleep apnea) 02/12/2015   Mild to moderate with AHI 14.7/hr no cpap or oxygen   . PAF (paroxysmal atrial fibrillation) ([place]) 09/2014   CHADS2-VASc=5 >> Eliquis // 6/17: Amiodarone started  . Pericarditis    a. Hospitalized in [place]. Episode in 1998. Had a cath which was clean (also in 1990s). 2 further episodes that were mild.  Marland Kitchen PONV (postoperative nausea and vomiting)   . Thoracic ascending aortic aneurysm ([place]) 04/26/2016   High res CT 9/17: 4.0 cm ascending thoracic aortic aneurysm // Chest CTA 9/18: ascending aorta 3.9 cm, CAD, benign thyroid nodules, stable lung reticular changes, aortic atherosclerosis    Patient Active Problem List   Diagnosis Date Noted  . Thoracic ascending aortic aneurysm ([place]) 04/26/2016  . NICM (nonischemic cardiomyopathy) ([place]) 01/01/2016  . High risk medication use 01/01/2016  . LVH (left ventricular hypertrophy) 12/01/2015  . Bradycardia 12/01/2015  .  CAD (coronary artery disease) 10/12/2015  . OSA (obstructive sleep apnea) 02/12/2015  . Persistent atrial fibrillation ([place])   . Acute respiratory failure with hypoxia ([place]) 09/29/2014  . Pulmonary edema, acute ([place]) 09/29/2014  . HTN (hypertension) 09/29/2014  . Patient nonadherence 09/29/2014  . COPD (chronic obstructive pulmonary disease) ([place]) 09/29/2014  . Tobacco abuse 09/29/2014  . CKD (chronic kidney disease), stage II 09/29/2014  . Interstitial lung disease ([place])   . Nonspecific abnormal unspecified cardiovascular function study 06/28/2013  . Mixed hyperlipidemia 06/28/2013    Past Surgical History:  Procedure Laterality Date  . APPENDECTOMY    . BACK SURGERY    . CARDIAC  CATHETERIZATION  1998   "clean"  . CARDIOVERSION N/A 11/21/2014   Procedure: CARDIOVERSION;  Surgeon: Fay Records, MD;  Location: [facility] ENDOSCOPY;  Service: Cardiovascular;  Laterality: N/A;  . CARDIOVERSION N/A 10/22/2015   Procedure: CARDIOVERSION;  Surgeon: Herminio Commons, MD;  Location: [facility];  Service: Cardiovascular;  Laterality: N/A;  . FRACTURE SURGERY    . LEFT HEART CATHETERIZATION WITH CORONARY ANGIOGRAM N/A 10/01/2014   Procedure: LEFT HEART CATHETERIZATION WITH CORONARY ANGIOGRAM;  Surgeon: Wellington Hampshire, MD;  Location: [facility] CATH LAB;  Service: Cardiovascular;  Laterality: N/A;  . LESION REMOVAL Left 10/31/2019   Procedure: EXCISION SKIN NEOPLASM LEFT UPPER CHEST WALL, 9X3 CM WITH LAYERED CLOSURE;  Surgeon: Armandina Gemma, MD;  Location: [facility] ORS;  Service: General;  Laterality: Left;  . LUMBAR LAMINECTOMY  1970's   "partial"  . ORIF FINGER / THUMB FRACTURE Left ~ 2006   "thumb"  . TONSILLECTOMY         Family History  Problem Relation Age of Onset  . Heart disease Mother        VALVE SURGERY  . Stroke Mother   . Heart failure Father   . CAD Neg Hx   . Heart attack Neg Hx     Social History   Tobacco Use  . Smoking status: Former Smoker    Packs/day: 0.25    Years: 60.00    Pack years: 15.00    Types: Cigarettes    Quit date: 07/11/1998    Years since quitting: 21.5  . Smokeless tobacco: Never Used  Vaping Use  . Vaping Use: Never used  Substance Use Topics  . Alcohol use: Yes    Alcohol/week: 0.0 standard drinks    Comment: 09/29/2014 "maybe 3-4 beers/month"  . Drug use: No    Home Medications Prior to Admission medications   Medication Sig Start Date End Date Taking? Authorizing Provider  amiodarone (PACERONE) 200 MG tablet TAKE 1 TABLET EVERY DAY AND 1/2 TABLET ON SATURDAY AND SUNDAY 11/04/19   Jettie Booze, MD  amLODipine (NORVASC) 5 MG tablet TAKE 2 TABLETS BY MOUTH EVERY DAY Patient taking differently: Take 10 mg by mouth daily.   08/07/19   Jettie Booze, MD  apixaban (ELIQUIS) 5 MG TABS tablet Take 1 tablet (5 mg total) by mouth 2 (two) times daily. 11/19/14   Richardson Dopp T, PA-C  cholecalciferol (VITAMIN D3) 25 MCG (1000 UNIT) tablet Take 1,000 Units by mouth daily.    [provider]  furosemide (LASIX) 40 MG tablet Take 1 tablet (40 mg total) by mouth daily. 11/14/19   Jettie Booze, MD  losartan (COZAAR) 25 MG tablet Take 2 tablets (50 mg total) by mouth daily. PLEASE SCHEDULE AN APPT FOR FURTHER REFILLS 1ST ATTEMPT [phone] 10/14/19   Jettie Booze, MD  Multiple Vitamin (  MULTIVITAMIN WITH MINERALS) TABS tablet Take 1 tablet by mouth daily.    [provider]  traMADol (ULTRAM) 50 MG tablet Take 1-2 tablets (50-100 mg total) by mouth every 6 (six) hours as needed. 10/31/19   Armandina Gemma, MD    Allergies    Patient has no known allergies.  Review of Systems   Review of Systems  Respiratory: Positive for shortness of breath.   All other systems reviewed and are negative.   Physical Exam Updated Vital Signs BP (!) 111/57 (BP Location: Left Arm)   Pulse 76   Temp 100.1 F (37.8 C) (Oral)   Resp 16   Ht 6\' 3"  (1.905 m)   Wt 89.8 kg   SpO2 97%   BMI 24.75 kg/m   Physical Exam Vitals and nursing note reviewed.  Constitutional:      General: He is not in acute distress.    Appearance: He is well-developed and normal weight.  HENT:     Head: Normocephalic and atraumatic.  Eyes:     Conjunctiva/sclera: Conjunctivae normal.     Pupils: Pupils are equal, round, and reactive to light.  Cardiovascular:     Rate and Rhythm: Normal rate and regular rhythm.     Pulses: Normal pulses.     Heart sounds: No murmur heard.   Pulmonary:     Effort: Pulmonary effort is normal. No respiratory distress.     Breath sounds: Examination of the right-lower field reveals rales. Examination of the left-lower field reveals rales. Rales present. No wheezing.  Abdominal:     General:  There is no distension.     Palpations: Abdomen is soft.     Tenderness: There is no abdominal tenderness. There is no right CVA tenderness, left CVA tenderness, guarding or rebound.  Genitourinary:   Musculoskeletal:        General: No tenderness. Normal range of motion.     Cervical back: Normal range of motion and neck supple.     Right lower leg: No edema.     Left lower leg: No edema.  Skin:    General: Skin is warm and dry.     Findings: No erythema or rash.  Neurological:     General: No focal deficit present.     Mental Status: He is alert and oriented to person, place, and time. Mental status is at baseline.  Psychiatric:        Mood and Affect: Mood normal.        Behavior: Behavior normal.        Thought Content: Thought content normal.     ED Results / Procedures / Treatments   Labs (all labs ordered are listed, but only abnormal results are displayed) Labs Reviewed  LACTIC ACID, PLASMA - Abnormal; Notable for the following components:      Result Value   Lactic Acid, Venous 2.6 (*)    All other components within normal limits  COMPREHENSIVE METABOLIC PANEL - Abnormal; Notable for the following components:   Sodium 133 (*)    Potassium 3.4 (*)    CO2 19 (*)    Glucose, Bld 113 (*)    BUN 38 (*)    Creatinine, Ser 1.78 (*)    Calcium 8.4 (*)    Total Protein 6.4 (*)    Albumin 2.6 (*)    GFR calc non Af Amer 35 (*)    GFR calc Af Amer 41 (*)    All other components within normal limits  CBC WITH DIFFERENTIAL/PLATELET - Abnormal; Notable for the following components:   WBC 22.3 (*)    RBC 3.71 (*)    Hemoglobin 12.0 (*)    HCT 35.5 (*)    Neutro Abs 20.5 (*)    Lymphs Abs 0.4 (*)    Abs Immature Granulocytes 0.32 (*)    All other components within normal limits  APTT - Abnormal; Notable for the following components:   aPTT 45 (*)    All other components within normal limits  PROTIME-INR - Abnormal; Notable for the following components:   Prothrombin  Time 17.9 (*)    INR 1.5 (*)    All other components within normal limits  URINALYSIS, ROUTINE W REFLEX MICROSCOPIC - Abnormal; Notable for the following components:   APPearance CLOUDY (*)    Hgb urine dipstick SMALL (*)    Protein, ur 30 (*)    Nitrite POSITIVE (*)    Leukocytes,Ua LARGE (*)    WBC, UA >50 (*)    Bacteria, UA MANY (*)    Non Squamous Epithelial 6-10 (*)    All other components within normal limits  BRAIN NATRIURETIC PEPTIDE - Abnormal; Notable for the following components:   B Natriuretic Peptide 869.9 (*)    All other components within normal limits  CULTURE, BLOOD (ROUTINE X 2)  SARS CORONAVIRUS 2 BY RT PCR (HOSPITAL ORDER, [facility])  CULTURE, BLOOD (ROUTINE X 2)  URINE CULTURE  LACTIC ACID, PLASMA    EKG EKG Interpretation  Date/Time:  Thursday January 09 2020 11:58:27 EDT Ventricular Rate:  74 PR Interval:    QRS Duration: 168 QT Interval:  507 QTC Calculation: 563 R Axis:   56 Text Interpretation: Sinus rhythm Right bundle branch block Right bundle branch block now complete since prior EKG in april 2021 Confirmed by Blanchie Dessert [phone]) on 01/09/2020 12:22:02 PM   Radiology DG Chest Port 1 View  Result Date: 01/09/2020 CLINICAL DATA:  Shortness of breath. EXAM: PORTABLE CHEST 1 VIEW COMPARISON:  10/05/2015. FINDINGS: Cardiomegaly. No pulmonary venous congestion. Mild bilateral interstitial prominence noted. A component these changes may be chronic. Active interstitial process including interstitial edema and/or pneumonitis cannot be excluded. No pleural effusion or pneumothorax. IMPRESSION: Cardiomegaly. Mild bilateral interstitial prominence noted. Component these changes may be chronic, an active interstitial process including interstitial edema and/or pneumonitis cannot be excluded. Electronically Signed   By: Marcello Moores  Register   On: 01/09/2020 13:22    Procedures Procedures (including critical care time)  Medications  Ordered in ED Medications  cefTRIAXone (ROCEPHIN) 1 g in sodium chloride 0.9 % 100 mL IVPB (has no administration in time range)    ED Course  I have reviewed the triage vital signs and the nursing notes.  Pertinent labs & imaging results that were available during my care of the patient were reviewed by me and considered in my medical decision making (see chart for details).    MDM Rules/Calculators/A&P                          79 year old male presenting today with symptoms concerning for sepsis most likely from a urinary source.  Patient has been having dysuria, frequency and urgency and now fever.  Patient has been taking an antibiotic at home that he does not know the name of.  However he has not seen his doctor for this issue and was just taking something he had leftover.  Son states he  is also been taking prednisone at home because he had some of that and thought that might be helping with his shortness of breath.  Patient has rales bilaterally on exam and is satting 97%.  There is no significant wheezing at this time.  He has no abdominal pain concerning for an acute bowel process.  However concern for UTI.  Possibility for pneumonia.  Lower suspicion for Covid.  Patient has no evidence of Fournier's gangrene but does have macerated skin and rawness from leaking urine that has been sitting on his scrotum breaking down his skin.  Code sepsis initiated.  Given patient shortness of breath and history of dilated cardiomyopathy and CHF full weight for lactate.  Patient given Tylenol and Rocephin. 4:18 PM Patient's lactate is 2.6, Covid is negative, PE with new AKI with creatinine of 1.78 from baseline of 1.4, leukocytosis of 22,000 with stable hemoglobin, BNP elevated at 869 and UA with positive nitrites, positive leukocytes, greater than 50 white blood cells and many bacteria.  Chest x-ray does show cardiomegaly mild bilateral interstitial prominence noted which could be acute interstitial edema  or pneumonitis.   Given lactate of 2.6 patient has had soft blood pressures in the 17C systolics but maps remain greater than 60.  On reevaluation patient reports that shortness of breath is significantly improved after 500 mL of fluid.  Repeat lactate is pending.  Patient given a total of 1500 but 92mL/kg would be 3L and given lower lactate, dilated cardiomyopathy will go slow on fluids.  CRITICAL CARE Performed by: Samai Corea Total critical care time: 30 minutes Critical care time was exclusive of separately billable procedures and treating other patients. Critical care was necessary to treat or prevent imminent or life-threatening deterioration. Critical care was time spent personally by me on the following activities: development of treatment plan with patient and/or surrogate as well as nursing, discussions with consultants, evaluation of patient's response to treatment, examination of patient, obtaining history from patient or surrogate, ordering and performing treatments and interventions, ordering and review of laboratory studies, ordering and review of radiographic studies, pulse oximetry and re-evaluation of patient's condition.  MDM Number of Diagnoses or Management Options   Amount and/or Complexity of Data Reviewed Clinical lab tests: ordered and reviewed Tests in the radiology section of CPT: ordered and reviewed Tests in the medicine section of CPT: ordered and reviewed Decide to obtain previous medical records or to obtain history from someone other than the patient: yes Obtain history from someone other than the patient: yes Review and summarize past medical records: yes Discuss the patient with other providers: yes Independent visualization of images, tracings, or specimens: yes  Risk of Complications, Morbidity, and/or Mortality Presenting problems: high Diagnostic procedures: low Management options: low  Patient Progress Patient progress: improved    Final  Clinical Impression(s) / ED Diagnoses Final diagnoses:  Sepsis with acute renal failure without septic shock, due to unspecified organism, unspecified acute renal failure type [facility])    Rx / DC Orders ED Discharge Orders    None       Blanchie Dessert, MD 01/09/20 1629

## 2020-01-09 NOTE — ED Triage Notes (Addendum)
Pt BIB [facility] EMS, pt worsening SOB over 3 days from baseline, no reported hx of CHF or COPD by EMS. Pt believes he has a UTI and has been taking son's antibiotic for it, does not know the name of the antibiotic. 97% on RA, placed on 3L for comfort, up to 98%. Febrile for EMS, 103, no antipyretics given.

## 2020-01-10 DIAGNOSIS — R6520 Severe sepsis without septic shock: Secondary | ICD-10-CM

## 2020-01-10 DIAGNOSIS — A419 Sepsis, unspecified organism: Secondary | ICD-10-CM

## 2020-01-10 DIAGNOSIS — N179 Acute kidney failure, unspecified: Secondary | ICD-10-CM

## 2020-01-10 LAB — BLOOD CULTURE ID PANEL (REFLEXED)

## 2020-01-10 LAB — BASIC METABOLIC PANEL
Anion gap: 11 (ref 5–15)
BUN: 35 mg/dL — ABNORMAL HIGH (ref 8–23)
CO2: 19 mmol/L — ABNORMAL LOW (ref 22–32)
Calcium: 8.2 mg/dL — ABNORMAL LOW (ref 8.9–10.3)
Chloride: 106 mmol/L (ref 98–111)
Creatinine, Ser: 1.52 mg/dL — ABNORMAL HIGH (ref 0.61–1.24)
GFR calc Af Amer: 50 mL/min — ABNORMAL LOW (ref >60)
GFR calc non Af Amer: 43 mL/min — ABNORMAL LOW (ref >60)
Glucose, Bld: 115 mg/dL — ABNORMAL HIGH (ref 70–99)
Potassium: 3.8 mmol/L (ref 3.5–5.1)
Sodium: 136 mmol/L (ref 135–145)

## 2020-01-10 LAB — CBC
HCT: 31.8 % — ABNORMAL LOW (ref 39.0–52.0)
Hemoglobin: 10.9 g/dL — ABNORMAL LOW (ref 13.0–17.0)
MCH: 32.9 pg (ref 26.0–34.0)
MCHC: 34.3 g/dL (ref 30.0–36.0)
MCV: 96.1 fL (ref 80.0–100.0)
Platelets: 264 10*3/uL (ref 150–400)
RBC: 3.31 MIL/uL — ABNORMAL LOW (ref 4.22–5.81)
RDW: 13.6 % (ref 11.5–15.5)
WBC: 22.5 10*3/uL — ABNORMAL HIGH (ref 4.0–10.5)
nRBC: 0 % (ref 0.0–0.2)

## 2020-01-10 MED ORDER — LEVALBUTEROL HCL 0.63 MG/3ML IN NEBU
0.6300 mg | INHALATION_SOLUTION | Freq: Four times a day (QID) | RESPIRATORY_TRACT | Status: DC
  Administered 2020-01-10 – 2020-01-11 (×2): 0.63 mg via RESPIRATORY_TRACT
  Filled 2020-01-10 (×3): qty 3

## 2020-01-10 MED ORDER — GERHARDT'S BUTT CREAM
TOPICAL_CREAM | Freq: Four times a day (QID) | CUTANEOUS | Status: DC
  Administered 2020-01-10: 1 via TOPICAL
  Filled 2020-01-10 (×2): qty 1

## 2020-01-10 MED ORDER — LEVALBUTEROL HCL 0.63 MG/3ML IN NEBU: 0.6300 mg | INHALATION_SOLUTION | Freq: Three times a day (TID) | RESPIRATORY_TRACT | Status: DC

## 2020-01-10 NOTE — Progress Notes (Signed)
[facility] Progress Note  Patient: Bruce Cohen    GDJ:[id number]  [place]: 01/09/2020     Date of Service: the patient was seen and examined on 01/10/2020  Brief hospital course: Bruce Cohen is a 79 y.o. male with medical history significant of CAD, chronic systolic CHF, cardiomyopathy, paroxysmal A. fib, COPD, hypertension, hyperlipidemia, thoracic aneurysm, presented with dysuria.  Symptoms started 3 days ago, with urinary frequency, urine incontinent, lower abdominal discomfort, episodes of chills but no fever, no nauseous vomiting.    Currently plan is continue IV antibiotics.  Assessment and Plan: 1.  Sepsis due to UTI POA from E. coli Gram-negative sepsis. Significant scrotal swelling but currently no evidence of gangrene or ulceration or abscess. Blood cultures positive for E. coli. Continue with IV ceftriaxone. We will check CT pelvis still. Unable to perform IV contrast due to patient's renal function.  2.  Acute kidney injury on chronic renal disease stage IIIa Renal function improving. Likely in the setting of sepsis and dehydration. Monitor.  IV fluids stopped.  3.  Chronic systolic CHF Initially on admission hypovolemic. Currently mild wheezing. Discontinue fluid.  Continue to hold Lasix.  Resume tomorrow.  4.  Essential hypertension Blood pressure on hold due to sepsis.  Monitor.  5.  Hypokalemia Replaced.  6.  Paroxysmal A. fib Continue amiodarone.  Continue Eliquis.  Diet: Cardiac diet DVT Prophylaxis:  apixaban (ELIQUIS) tablet 5 mg    Advance goals of care discussion: Full code  Family Communication: no family was present at bedside, at the time of interview.   Disposition:  Status is: Inpatient  Remains inpatient appropriate because:IV treatments appropriate due to intensity of illness or inability to take PO   Dispo: The patient is from: Home              Anticipated d/c is to: Home              Anticipated d/c date is: 3 days               Patient currently is not medically stable to d/c.        Subjective: No nausea no vomiting.  Continues to have scrotal pain.  No fever no chills.  No diarrhea.  Physical Exam:  General: Appear in mild distress, scrotal redness, no other rash; Oral Mucosa Clear, moist. no Abnormal Neck Mass Or lumps, Conjunctiva normal  Cardiovascular: S1 and S2 Present, no Murmur, Respiratory: good respiratory effort, Bilateral Air entry present and Clear to Auscultation, no Crackles, no wheezes Abdomen: Bowel Sound present, Soft and no tenderness Extremities: no Pedal edema, no calf tenderness Neurology: alert and oriented to time, place, and person affect appropriate. no new focal deficit Gait not checked due to patient safety concerns  Vitals:   01/09/20 2108 01/10/20 0057 01/10/20 0523 01/10/20 1425  BP: (!) 92/52 (!) 100/53 116/69 (!) 143/68  Pulse: (!) 53 (!) 51 (!) 59 77  Resp: 16 15 16  (!) 24  Temp: 98.2 F (36.8 C) 98.1 F (36.7 C) 97.6 F (36.4 C) 98.7 F (37.1 C)  TempSrc: Oral Oral Oral Oral  SpO2: 96% 98% 94% 92%  Weight: 94.4 kg     Height: 6\' 3"  (1.905 m)       Intake/Output Summary (Last 24 hours) at 01/10/2020 1931 Last data filed at 01/10/2020 1317 Gross per 24 hour  Intake 1779.27 ml  Output 350 ml  Net 1429.27 ml   Filed Weights   01/09/20 1146 01/09/20 1211 01/09/20  2108  Weight: 89.8 kg 89.8 kg 94.4 kg    Data Reviewed: I have personally reviewed and interpreted daily labs, tele strips, imagings as discussed above. I reviewed all nursing notes, pharmacy notes, vitals, pertinent old records I have discussed plan of care as described above with RN and patient/family.  CBC: Recent Labs  Lab 01/09/20 1244 01/10/20 0205  WBC 22.3* 22.5*  NEUTROABS 20.5*  --   HGB 12.0* 10.9*  HCT 35.5* 31.8*  MCV 95.7 96.1  PLT 246 979   Basic Metabolic Panel: Recent Labs  Lab 01/09/20 1244 01/10/20 0750  NA 133* 136  K 3.4* 3.8  CL 102 106  CO2 19* 19*    GLUCOSE 113* 115*  BUN 38* 35*  CREATININE 1.78* 1.52*  CALCIUM 8.4* 8.2*    Studies: No results found.  Scheduled Meds: . [START ON 01/11/2020] amiodarone  100 mg Oral Once per day on Sun Sat  . amiodarone  200 mg Oral Once per day on Mon Tue Wed Thu Fri  . apixaban  5 mg Oral BID  . Gerhardt's butt cream   Topical QID  . levalbuterol  0.63 mg Nebulization Q6H  . multivitamin with minerals  1 tablet Oral Daily   Continuous Infusions: . cefTRIAXone (ROCEPHIN)  IV 2 g (01/10/20 1317)   PRN Meds: acetaminophen **OR** acetaminophen, traMADol  Time spent: 35 minutes  Author: Berle Mull, MD [facility] 01/10/2020 7:31 PM  To reach On-call, see care teams to locate the attending and reach out via [email or web address]. Between 7PM-7AM, please contact night-coverage If you still have difficulty reaching the attending provider, please page the [facility] (Director on Call) for [facility] on amion for assistance.

## 2020-01-10 NOTE — Progress Notes (Signed)
PHARMACY - PHYSICIAN COMMUNICATION CRITICAL VALUE ALERT - BLOOD CULTURE IDENTIFICATION (BCID)  Bruce Cohen is an 79 y.o. male who presented to [facility] on 01/09/2020 with a chief complaint of dysuria.   Assessment:  79 year old male admitted with dysuria, urinary frequency and incontinence. Now with E coli in 1/2 sets of blood cultures. Likely a urinary source. Urine culture pending.   Name of physician (or Provider) Contacted: Marlowe Sax  Current antibiotics: Ceftriaxone 2 gm q 24 hours  Changes to prescribed antibiotics recommended:  Patient is on recommended antibiotics - No changes needed  Results for orders placed or performed during the hospital encounter of 01/09/20  Blood Culture ID Panel (Reflexed) (Collected: 01/09/2020 12:50 PM)  Result Value Ref Range   Enterococcus species NOT DETECTED NOT DETECTED   Listeria monocytogenes NOT DETECTED NOT DETECTED   Staphylococcus species NOT DETECTED NOT DETECTED   Staphylococcus aureus (BCID) NOT DETECTED NOT DETECTED   Streptococcus species NOT DETECTED NOT DETECTED   Streptococcus agalactiae NOT DETECTED NOT DETECTED   Streptococcus pneumoniae NOT DETECTED NOT DETECTED   Streptococcus pyogenes NOT DETECTED NOT DETECTED   Acinetobacter baumannii NOT DETECTED NOT DETECTED   Enterobacteriaceae species DETECTED (A) NOT DETECTED   Enterobacter cloacae complex NOT DETECTED NOT DETECTED   Escherichia coli DETECTED (A) NOT DETECTED   Klebsiella oxytoca NOT DETECTED NOT DETECTED   Klebsiella pneumoniae NOT DETECTED NOT DETECTED   Proteus species NOT DETECTED NOT DETECTED   Serratia marcescens NOT DETECTED NOT DETECTED   Carbapenem resistance NOT DETECTED NOT DETECTED   Haemophilus influenzae NOT DETECTED NOT DETECTED   Neisseria meningitidis NOT DETECTED NOT DETECTED   Pseudomonas aeruginosa NOT DETECTED NOT DETECTED   Candida albicans NOT DETECTED NOT DETECTED   Candida glabrata NOT DETECTED NOT DETECTED   Candida krusei NOT  DETECTED NOT DETECTED   Candida parapsilosis NOT DETECTED NOT DETECTED   Candida tropicalis NOT DETECTED NOT DETECTED    Jimmy Footman, PharmD, BCPS, BCIDP Infectious Diseases Clinical Pharmacist Phone: [phone] 01/10/2020  9:48 AM

## 2020-01-10 NOTE — Consult Note (Signed)
WOC Nurse Consult Note: Patient receiving care in [facility] [phone]. Reason for Consult: scrotal wound Wound type: MASD-IAD from urinary incontinence Pressure Injury POA: Yes/No/NA Measurement: Wound bed: erythematous, painful Drainage (amount, consistency, odor)  Periwound: Dressing procedure/placement/frequency: QID application of Gerhardt's butt cream after cleansing with no rinse spray. Monitor the wound area(s) for worsening of condition such as: Signs/symptoms of infection,  Increase in size,  Development of or worsening of odor, Development of pain, or increased pain at the affected locations.  Notify the medical team if any of these develop.  Thank you for the consult.  Discussed plan of care with the patient and bedside nurse.  Page nurse will not follow at this time.  Please re-consult the [place] team if needed.  Val Riles, RN, MSN, CWOCN, CNS-BC, pager [phone]

## 2020-01-11 ENCOUNTER — Inpatient Hospital Stay (HOSPITAL_COMMUNITY): Payer: Medicare Other

## 2020-01-11 LAB — COMPREHENSIVE METABOLIC PANEL
ALT: 31 U/L (ref 0–44)
AST: 23 U/L (ref 15–41)
Albumin: 2.2 g/dL — ABNORMAL LOW (ref 3.5–5.0)
Alkaline Phosphatase: 104 U/L (ref 38–126)
Anion gap: 12 (ref 5–15)
BUN: 27 mg/dL — ABNORMAL HIGH (ref 8–23)
CO2: 18 mmol/L — ABNORMAL LOW (ref 22–32)
Calcium: 8 mg/dL — ABNORMAL LOW (ref 8.9–10.3)
Chloride: 105 mmol/L (ref 98–111)
Creatinine, Ser: 1.55 mg/dL — ABNORMAL HIGH (ref 0.61–1.24)
GFR calc Af Amer: 49 mL/min — ABNORMAL LOW (ref >60)
GFR calc non Af Amer: 42 mL/min — ABNORMAL LOW (ref >60)
Glucose, Bld: 93 mg/dL (ref 70–99)
Potassium: 3.6 mmol/L (ref 3.5–5.1)
Sodium: 135 mmol/L (ref 135–145)
Total Bilirubin: 0.8 mg/dL (ref 0.3–1.2)
Total Protein: 5.6 g/dL — ABNORMAL LOW (ref 6.5–8.1)

## 2020-01-11 LAB — CBC WITH DIFFERENTIAL/PLATELET
Abs Immature Granulocytes: 0.79 10*3/uL — ABNORMAL HIGH (ref 0.00–0.07)
Basophils Absolute: 0.1 10*3/uL (ref 0.0–0.1)
Basophils Relative: 1 %
Eosinophils Absolute: 0.1 10*3/uL (ref 0.0–0.5)
Eosinophils Relative: 0 %
HCT: 36.7 % — ABNORMAL LOW (ref 39.0–52.0)
Hemoglobin: 12.2 g/dL — ABNORMAL LOW (ref 13.0–17.0)
Immature Granulocytes: 5 %
Lymphocytes Relative: 7 %
Lymphs Abs: 1.2 10*3/uL (ref 0.7–4.0)
MCH: 32.7 pg (ref 26.0–34.0)
MCHC: 33.2 g/dL (ref 30.0–36.0)
MCV: 98.4 fL (ref 80.0–100.0)
Monocytes Absolute: 1.2 10*3/uL — ABNORMAL HIGH (ref 0.1–1.0)
Monocytes Relative: 7 %
Neutro Abs: 13.3 10*3/uL — ABNORMAL HIGH (ref 1.7–7.7)
Neutrophils Relative %: 80 %
Platelets: 229 10*3/uL (ref 150–400)
RBC: 3.73 MIL/uL — ABNORMAL LOW (ref 4.22–5.81)
RDW: 13.7 % (ref 11.5–15.5)
WBC: 16.7 10*3/uL — ABNORMAL HIGH (ref 4.0–10.5)
nRBC: 0 % (ref 0.0–0.2)

## 2020-01-11 LAB — URINE CULTURE

## 2020-01-11 LAB — MAGNESIUM: Magnesium: 2 mg/dL (ref 1.7–2.4)

## 2020-01-11 MED ORDER — LEVALBUTEROL HCL 0.63 MG/3ML IN NEBU
0.6300 mg | INHALATION_SOLUTION | Freq: Four times a day (QID) | RESPIRATORY_TRACT | Status: DC | PRN
  Administered 2020-01-15: 0.63 mg via RESPIRATORY_TRACT
  Filled 2020-01-11: qty 3

## 2020-01-11 MED ORDER — FUROSEMIDE 10 MG/ML IJ SOLN
20.0000 mg | Freq: Once | INTRAMUSCULAR | Status: AC
  Administered 2020-01-11: 20 mg via INTRAVENOUS
  Filled 2020-01-11: qty 2

## 2020-01-11 MED ORDER — SENNOSIDES-DOCUSATE SODIUM 8.6-50 MG PO TABS
1.0000 | ORAL_TABLET | Freq: Two times a day (BID) | ORAL | Status: DC
  Administered 2020-01-11: 1 via ORAL
  Filled 2020-01-11 (×3): qty 1

## 2020-01-11 MED ORDER — BISACODYL 10 MG RE SUPP: 10.0000 mg | Freq: Once | RECTAL | Status: DC

## 2020-01-11 MED ORDER — TAMSULOSIN HCL 0.4 MG PO CAPS
0.4000 mg | ORAL_CAPSULE | Freq: Every day | ORAL | Status: DC
  Administered 2020-01-11 – 2020-01-16 (×6): 0.4 mg via ORAL
  Filled 2020-01-11 (×6): qty 1

## 2020-01-11 MED ORDER — TRAZODONE HCL 50 MG PO TABS
50.0000 mg | ORAL_TABLET | Freq: Once | ORAL | Status: AC
  Administered 2020-01-11: 50 mg via ORAL
  Filled 2020-01-11: qty 1

## 2020-01-11 MED ORDER — ALBUMIN HUMAN 5 % IV SOLN
12.5000 g | Freq: Once | INTRAVENOUS | Status: AC
  Administered 2020-01-11: 12.5 g via INTRAVENOUS
  Filled 2020-01-11: qty 250

## 2020-01-11 MED ORDER — GUAIFENESIN ER 600 MG PO TB12
600.0000 mg | ORAL_TABLET | Freq: Two times a day (BID) | ORAL | Status: DC
  Administered 2020-01-11 – 2020-01-16 (×11): 600 mg via ORAL
  Filled 2020-01-11 (×11): qty 1

## 2020-01-11 NOTE — Progress Notes (Signed)
PT Cancellation Note  Patient Details Name: Bruce Cohen MRN: [id number] DOB: 1940/08/28   Cancelled Treatment:     Pt refusing physical therapy evaluation secondary to dysuria.    Wyona Almas, PT, DPT Acute Rehabilitation Services Pager [phone] Office [phone]    Deno Etienne 01/11/2020, 3:47 PM

## 2020-01-11 NOTE — Progress Notes (Signed)
[facility] Progress Note  Patient: Bruce Cohen    GHW:[id number]  [place]: 01/09/2020     Date of Service: the patient was seen and examined on 01/11/2020  Brief hospital course: Bruce Cohen is a 79 y.o. male with medical history significant of CAD, chronic systolic CHF, cardiomyopathy, paroxysmal A. fib, COPD, hypertension, hyperlipidemia, thoracic aneurysm, presented with dysuria.  Symptoms started 3 days ago, with urinary frequency, urine incontinent, lower abdominal discomfort, episodes of chills but no fever, no nauseous vomiting.    Currently plan is continue IV antibiotics.  Assessment and Plan: 1.  Sepsis due to UTI POA from E. coli Gram-negative sepsis. Significant scrotal swelling but currently no evidence of gangrene or ulceration or abscess. Blood cultures positive for E. coli. Continue with IV ceftriaxone. Ct abdomen and pelvis unremarkable Unable to perform IV contrast due to patient's renal function.  2.  Acute kidney injury on chronic renal disease stage IIIa Renal function improving. Likely in the setting of sepsis and dehydration. Monitor.  IV fluids stopped.  3.  Chronic systolic CHF Initially on admission hypovolemic. Currently mild wheezing. Discontinue fluid.  Resume Lasix.  4.  Essential hypertension Blood pressure on hold due to sepsis.  Monitor.  5.  Hypokalemia Replaced.  6.  Paroxysmal A. fib Continue amiodarone.  Continue Eliquis.  Diet: Cardiac diet DVT Prophylaxis:  apixaban (ELIQUIS) tablet 5 mg    Advance goals of care discussion: Full code  Family Communication: no family was present at bedside, at the time of interview.   Disposition:  Status is: Inpatient  Remains inpatient appropriate because:IV treatments appropriate due to intensity of illness or inability to take PO   Dispo: The patient is from: Home              Anticipated d/c is to: Home              Anticipated d/c date is: 3 days              Patient  currently is not medically stable to d/c.        Subjective: feels bad, still has shortness of breath, also reports pain while urination and chills with fever.   Physical Exam:  General: Appear in moderatedistress, scrotal redness, no other rash; Oral Mucosa Clear, moist. no Abnormal Neck Mass Or lumps, Conjunctiva normal  Cardiovascular: S1 and S2 Present, no Murmur, Respiratory: good respiratory effort, Bilateral Air entry present and Clear to Auscultation, no Crackles, bilateral wheezes Abdomen: Bowel Sound present, Soft and no tenderness Extremities: bilateral Pedal edema, no calf tenderness Neurology: alert and oriented to time, place, and person affect appropriate. no new focal deficit Gait not checked due to patient safety concerns  Vitals:   01/10/20 1425 01/10/20 2058 01/11/20 0516 01/11/20 1425  BP: (!) 143/68 140/67 (!) 141/69 135/78  Pulse: 77 79 85 79  Resp: (!) 24 20 17 18   Temp: 98.7 F (37.1 C) 98.5 F (36.9 C) 98.7 F (37.1 C) 99.4 F (37.4 C)  TempSrc: Oral Oral Oral Oral  SpO2: 92% 94% 93% 93%  Weight:      Height:        Intake/Output Summary (Last 24 hours) at 01/11/2020 1854 Last data filed at 01/11/2020 1800 Gross per 24 hour  Intake 590.63 ml  Output 1600 ml  Net -1009.37 ml   Filed Weights   01/09/20 1146 01/09/20 1211 01/09/20 2108  Weight: 89.8 kg 89.8 kg 94.4 kg    Data Reviewed: I have  personally reviewed and interpreted daily labs, tele strips, imagings as discussed above. I reviewed all nursing notes, pharmacy notes, vitals, pertinent old records I have discussed plan of care as described above with RN and patient/family.  CBC: Recent Labs  Lab 01/09/20 1244 01/10/20 0205 01/11/20 0751  WBC 22.3* 22.5* 16.7*  NEUTROABS 20.5*  --  13.3*  HGB 12.0* 10.9* 12.2*  HCT 35.5* 31.8* 36.7*  MCV 95.7 96.1 98.4  PLT 246 264 454   Basic Metabolic Panel: Recent Labs  Lab 01/09/20 1244 01/10/20 0750 01/11/20 0751  NA 133* 136 135    K 3.4* 3.8 3.6  CL 102 106 105  CO2 19* 19* 18*  GLUCOSE 113* 115* 93  BUN 38* 35* 27*  CREATININE 1.78* 1.52* 1.55*  CALCIUM 8.4* 8.2* 8.0*  MG  --   --  2.0    Studies: CT ABDOMEN PELVIS WO CONTRAST  Result Date: 01/11/2020 CLINICAL DATA:  Scrotal swelling EXAM: CT ABDOMEN AND PELVIS WITHOUT CONTRAST TECHNIQUE: Multidetector CT imaging of the abdomen and pelvis was performed following the standard protocol without IV contrast. COMPARISON:  Renal ultrasound dated 01/09/2020 FINDINGS: Lower chest: There is a small left pleural effusion. Chronic reticular/fibrotic changes are seen in the lung bases. Hepatobiliary: No focal liver abnormality is seen. Density layering in the gallbladder may reflect gallstones or gallbladder sludge. No gallbladder wall thickening or biliary dilatation. Pancreas: Unremarkable. No pancreatic ductal dilatation or surrounding inflammatory changes. Spleen: Normal in size without focal abnormality. Adrenals/Urinary Tract: Adrenal glands are unremarkable. Kidneys are normal, without renal calculi, focal lesion, or hydronephrosis. Multiple bladder diverticula are noted. Stomach/Bowel: Stomach is within normal limits. No pericecal inflammatory changes are noted to suggest acute appendicitis. No evidence of bowel wall thickening, distention, or inflammatory changes. Vascular/Lymphatic: Aortic atherosclerosis. No enlarged abdominal or pelvic lymph nodes. Reproductive: Prostate is unremarkable. No large scrotal fluid collection or inflammatory changes are noted given the limitations of this modality. Other: No abdominal wall hernia or abnormality. No abdominopelvic ascites. Musculoskeletal: Degenerative changes are seen in the spine. IMPRESSION: 1. No large scrotal fluid collection or inflammatory changes are noted given the limitations of this modality. 2. Multiple bladder diverticula. 3. Small left pleural effusion. Aortic Atherosclerosis (ICD10-I70.0). Electronically Signed   By:  Zerita Boers M.D.   On: 01/11/2020 10:04    Scheduled Meds: . amiodarone  100 mg Oral Once per day on Sun Sat  . amiodarone  200 mg Oral Once per day on Mon Tue Wed Thu Fri  . apixaban  5 mg Oral BID  . Gerhardt's butt cream   Topical QID  . guaiFENesin  600 mg Oral BID  . multivitamin with minerals  1 tablet Oral Daily  . senna-docusate  1 tablet Oral BID  . tamsulosin  0.4 mg Oral Daily   Continuous Infusions: . cefTRIAXone (ROCEPHIN)  IV Stopped (01/11/20 1237)   PRN Meds: acetaminophen **OR** acetaminophen, levalbuterol, traMADol  Time spent: 35 minutes  Author: Berle Mull, MD [facility] 01/11/2020 6:54 PM  To reach On-call, see care teams to locate the attending and reach out via [email or web address]. Between 7PM-7AM, please contact night-coverage If you still have difficulty reaching the attending provider, please page the [facility] (Director on Call) for [facility] on amion for assistance.

## 2020-01-11 NOTE — Plan of Care (Signed)
  Problem: Education: Goal: Knowledge of General Education information will improve Description: Including pain rating scale, medication(s)/side effects and non-pharmacologic comfort measures Outcome: Progressing   Problem: Health Behavior/Discharge Planning: Goal: Ability to manage health-related needs will improve Outcome: Progressing   Problem: Clinical Measurements: Goal: Diagnostic test results will improve Outcome: Progressing Goal: Respiratory complications will improve Outcome: Progressing   Problem: Activity: Goal: Risk for activity intolerance will decrease Outcome: Progressing   Problem: Nutrition: Goal: Adequate nutrition will be maintained Outcome: Progressing   Problem: Elimination: Goal: Will not experience complications related to bowel motility Outcome: Progressing Goal: Will not experience complications related to urinary retention Outcome: Progressing   Problem: Pain Managment: Goal: General experience of comfort will improve Outcome: Progressing   Problem: Safety: Goal: Ability to remain free from injury will improve Outcome: Progressing   Problem: Skin Integrity: Goal: Risk for impaired skin integrity will decrease Outcome: Progressing

## 2020-01-12 LAB — CULTURE, BLOOD (ROUTINE X 2): Special Requests: ADEQUATE

## 2020-01-12 LAB — COMPREHENSIVE METABOLIC PANEL
ALT: 22 U/L (ref 0–44)
AST: 17 U/L (ref 15–41)
Albumin: 2.1 g/dL — ABNORMAL LOW (ref 3.5–5.0)
Alkaline Phosphatase: 92 U/L (ref 38–126)
Anion gap: 9 (ref 5–15)
BUN: 19 mg/dL (ref 8–23)
CO2: 22 mmol/L (ref 22–32)
Calcium: 8 mg/dL — ABNORMAL LOW (ref 8.9–10.3)
Chloride: 103 mmol/L (ref 98–111)
Creatinine, Ser: 1.3 mg/dL — ABNORMAL HIGH (ref 0.61–1.24)
GFR calc Af Amer: >60 mL/min (ref >60)
GFR calc non Af Amer: 52 mL/min — ABNORMAL LOW (ref >60)
Glucose, Bld: 139 mg/dL — ABNORMAL HIGH (ref 70–99)
Potassium: 3.5 mmol/L (ref 3.5–5.1)
Sodium: 134 mmol/L — ABNORMAL LOW (ref 135–145)
Total Bilirubin: 0.5 mg/dL (ref 0.3–1.2)
Total Protein: 5.7 g/dL — ABNORMAL LOW (ref 6.5–8.1)

## 2020-01-12 LAB — CBC
HCT: 34.1 % — ABNORMAL LOW (ref 39.0–52.0)
Hemoglobin: 11.5 g/dL — ABNORMAL LOW (ref 13.0–17.0)
MCH: 32.6 pg (ref 26.0–34.0)
MCHC: 33.7 g/dL (ref 30.0–36.0)
MCV: 96.6 fL (ref 80.0–100.0)
Platelets: 216 10*3/uL (ref 150–400)
RBC: 3.53 MIL/uL — ABNORMAL LOW (ref 4.22–5.81)
RDW: 13.6 % (ref 11.5–15.5)
WBC: 13.9 10*3/uL — ABNORMAL HIGH (ref 4.0–10.5)
nRBC: 0 % (ref 0.0–0.2)

## 2020-01-12 MED ORDER — FUROSEMIDE 40 MG PO TABS
40.0000 mg | ORAL_TABLET | Freq: Every day | ORAL | Status: DC
  Administered 2020-01-12 – 2020-01-16 (×5): 40 mg via ORAL
  Filled 2020-01-12 (×5): qty 1

## 2020-01-12 MED ORDER — DOCUSATE SODIUM 100 MG PO CAPS
100.0000 mg | ORAL_CAPSULE | Freq: Two times a day (BID) | ORAL | Status: DC
  Administered 2020-01-12 – 2020-01-16 (×7): 100 mg via ORAL
  Filled 2020-01-12 (×7): qty 1

## 2020-01-12 NOTE — Plan of Care (Signed)
  Problem: Urinary Elimination: Goal: Signs and symptoms of infection will decrease Outcome: Progressing   

## 2020-01-12 NOTE — Plan of Care (Signed)
  Problem: Education: Goal: Knowledge of General Education information will improve Description: Including pain rating scale, medication(s)/side effects and non-pharmacologic comfort measures Outcome: Progressing   Problem: Health Behavior/Discharge Planning: Goal: Ability to manage health-related needs will improve Outcome: Progressing   Problem: Clinical Measurements: Goal: Ability to maintain clinical measurements within normal limits will improve Outcome: Progressing Goal: Respiratory complications will improve Outcome: Progressing   Problem: Activity: Goal: Risk for activity intolerance will decrease Outcome: Progressing   Problem: Nutrition: Goal: Adequate nutrition will be maintained Outcome: Progressing   Problem: Elimination: Goal: Will not experience complications related to bowel motility Outcome: Progressing Goal: Will not experience complications related to urinary retention Outcome: Progressing   Problem: Pain Managment: Goal: General experience of comfort will improve Outcome: Progressing   Problem: Safety: Goal: Ability to remain free from injury will improve Outcome: Progressing   Problem: Skin Integrity: Goal: Risk for impaired skin integrity will decrease Outcome: Progressing   

## 2020-01-12 NOTE — Evaluation (Signed)
Physical Therapy Evaluation Patient Details Name: Bruce Cohen MRN: [id number] DOB: 01-Jul-1941 Today's Date: 01/12/2020   History of Present Illness  Pt is a 41 y..M with significant PMH of CAD, CHF, cardiomyopathy, A. fib, COPD, hypertension, who presented with dysuria. Admitted with sepsis due to UTI.   Clinical Impression  Prior to admission, pt lives with his spouse/children and is independent. On PT evaluation, pt presents with decreased functional mobility secondary to decreased cardiopulmonary endurance, balance, and weakness. Ambulating 25 feet with a walker at a min guard assist level before fatiguing and requiring return to bed. Desaturation to 87% on RA, rebounded > 90% on 2L O2. Education reinforced regarding IS use, pt pulling ~1500. Recommending HHPT to maximize functional independence.    Follow Up Recommendations Home health PT;Supervision for mobility/OOB    Equipment Recommendations  None recommended by PT    Recommendations for Other Services       Precautions / Restrictions Precautions Precautions: Fall Restrictions Weight Bearing Restrictions: No      Mobility  Bed Mobility Overal bed mobility: Needs Assistance Bed Mobility: Sit to Supine       Sit to supine: Supervision      Transfers Overall transfer level: Needs assistance Equipment used: Rolling walker (2 wheeled) Transfers: Sit to/from Stand Sit to Stand: Min guard            Ambulation/Gait Ambulation/Gait assistance: Min guard   Assistive device: Rolling walker (2 wheeled) Gait Pattern/deviations: Step-through pattern;Decreased stride length Gait velocity: decreased   General Gait Details: Slow speed, no gross unsteadiness, fatigues easily  Financial trader Rankin (Stroke Patients Only)       Balance Overall balance assessment: Needs assistance Sitting-balance support: Feet supported Sitting balance-Leahy Scale: Good      Standing balance support: Bilateral upper extremity supported;During functional activity Standing balance-Leahy Scale: Poor Standing balance comment: reliant on walker                             Pertinent Vitals/Pain Pain Assessment: Faces Faces Pain Scale: Hurts a little bit Pain Location: dysuria when using urinal Pain Descriptors / Indicators: Burning Pain Intervention(s): Monitored during session    Home Living Family/patient expects to be discharged to:: Private residence Living Arrangements: Spouse/significant other;Children Available Help at Discharge: Family;Available 24 hours/day Type of Home: House Home Access: Stairs to enter   [employer] of Steps: 2 Home Layout: Two level Home Equipment: Environmental consultant - 2 wheels      Prior Function Level of Independence: Independent               Hand Dominance        Extremity/Trunk Assessment   Upper Extremity Assessment Upper Extremity Assessment: Generalized weakness    Lower Extremity Assessment Lower Extremity Assessment: Generalized weakness       Communication   Communication: HOH  Cognition Arousal/Alertness: Awake/alert Behavior During Therapy: WFL for tasks assessed/performed Overall Cognitive Status: Within Functional Limits for tasks assessed                                        General Comments      Exercises     Assessment/Plan    PT Assessment Patient needs continued PT services  PT Problem List Decreased strength;Decreased  activity tolerance;Decreased balance;Decreased mobility       PT Treatment Interventions DME instruction;Gait training;Stair training;Functional mobility training;Therapeutic activities;Therapeutic exercise;Balance training;Patient/family education    PT Goals (Current goals can be found in the Care Plan section)  Acute Rehab PT Goals Patient Stated Goal: feel better PT Goal Formulation: With patient Time For Goal  Achievement: 01/26/20 Potential to Achieve Goals: Good    Frequency Min 3X/week   Barriers to discharge        Co-evaluation               AM-PAC PT "6 Clicks" Mobility  Outcome Measure Help needed turning from your back to your side while in a flat bed without using bedrails?: None Help needed moving from lying on your back to sitting on the side of a flat bed without using bedrails?: A Little Help needed moving to and from a bed to a chair (including a wheelchair)?: A Little Help needed standing up from a chair using your arms (e.g., wheelchair or bedside chair)?: A Little Help needed to walk in hospital room?: A Little Help needed climbing 3-5 steps with a railing? : A Little 6 Click Score: 19    End of Session Equipment Utilized During Treatment: Oxygen Activity Tolerance: Patient limited by fatigue Patient left: in bed;with call bell/phone within reach Nurse Communication: Mobility status PT Visit Diagnosis: Difficulty in walking, not elsewhere classified (R26.2);Unsteadiness on feet (R26.81)    Time: 7517-0017 PT Time Calculation (min) (ACUTE ONLY): 12 min   Charges:   PT Evaluation $PT Eval Moderate Complexity: 1 Mod            Wyona Almas, PT, DPT Acute Rehabilitation Services Pager [phone] Office [phone]   Deno Etienne 01/12/2020, 3:07 PM

## 2020-01-12 NOTE — Progress Notes (Signed)
[facility] Progress Note  Patient: Bruce Cohen    KKX:[id number]  DOA: 01/09/2020     Date of Service: the patient was seen and examined on 01/12/2020  Brief hospital course: Bruce Gasior Telfordis a 79 y.o.malewith medical history significant ofCAD,chronic systolic CHF, cardiomyopathy,paroxysmal A. fib, COPD, hypertension, hyperlipidemia, thoracic aneurysm, presented with dysuria. Symptoms started 3 days ago,with urinary frequency, urine incontinent,lower abdominal discomfort,episodes of chills but no fever, no nauseous vomiting.    Currently plan is continue antibiotics.  Assessment and Plan: 1.  Sepsis due to UTI POA from E. coli Gram-negative sepsis. Significant scrotal swelling but currently no evidence of gangrene or ulceration or abscess. Blood cultures positive for E. coli. Continue with IV ceftriaxone. Ct abdomen and pelvis unremarkable Unable to perform IV contrast due to patient's renal function.  2.  Acute kidney injury on chronic renal disease stage IIIa Renal function improving. Likely in the setting of sepsis and dehydration. Monitor.  IV fluids stopped. Renal function improving with albumin and diuresis.  Monitor vitals initiating oral Lasix.  3.  Acute chronic systolic CHF Initially on admission hypovolemic.  Received IV fluids. Currently mild wheezing. Discontinue fluid.   Disciples remains volume overloaded. Received IV albumin along with Lasix. Resume oral Lasix.  4.  Essential hypertension Blood pressure on hold due to sepsis.  Monitor.  5.  Hypokalemia Replaced.  6.  Paroxysmal A. fib Continue amiodarone.  Continue Eliquis.  Diet: Cardiac diet DVT Prophylaxis:  apixaban (ELIQUIS) tablet 5 mg    Advance goals of care discussion: Full code  Family Communication: no family was present at bedside, at the time of interview.   Disposition:  Status is: Inpatient  Remains inpatient appropriate because:IV treatments appropriate due to  intensity of illness or inability to take PO  Dispo: The patient is from: Home              Anticipated d/c is to: Home              Anticipated d/c date is: 2 days              Patient currently is not medically stable to d/c.  Subjective: Feeling better continues to have fatigue and tiredness.  No nausea no vomiting.  Continues to shortness of breath but improving.  Physical Exam:  General: Appear in moderate distress, no Ras, scrotal redness; Oral Mucosa Clear, moist. no Abnormal Neck Mass Or lumps, Conjunctiva normal  Cardiovascular: S1 and S2 Present, no Murmur, Respiratory: increased respiratory effort, Bilateral Air entry present and bilateral  Crackles, no wheezes Abdomen: Bowel Sound present, Soft and no tenderness Extremities: no Pedal edema, no calf tenderness Neurology: alert and oriented to time, place, and person affect appropriate. no new focal deficit Gait not checked due to patient safety concerns  Vitals:   01/11/20 2059 01/12/20 0558 01/12/20 1021 01/12/20 1436  BP: 134/76 132/75  130/60  Pulse: 80 82  73  Resp: 18 18  16   Temp: 98.7 F (37.1 C) 98.4 F (36.9 C)  98.6 F (37 C)  TempSrc: Oral Oral    SpO2: 94% 96% 92% 96%  Weight:      Height:        Intake/Output Summary (Last 24 hours) at 01/12/2020 1609 Last data filed at 01/12/2020 0606 Gross per 24 hour  Intake 440.63 ml  Output 1575 ml  Net -1134.37 ml   Filed Weights   01/09/20 1146 01/09/20 1211 01/09/20 2108  Weight: 89.8 kg 89.8 kg 94.4  kg    Data Reviewed: I have personally reviewed and interpreted daily labs, tele strips, imagings as discussed above. I reviewed all nursing notes, pharmacy notes, vitals, pertinent old records I have discussed plan of care as described above with RN and patient/family.  CBC: Recent Labs  Lab 01/09/20 1244 01/10/20 0205 01/11/20 0751 01/12/20 0844  WBC 22.3* 22.5* 16.7* 13.9*  NEUTROABS 20.5*  --  13.3*  --   HGB 12.0* 10.9* 12.2* 11.5*  HCT 35.5*  31.8* 36.7* 34.1*  MCV 95.7 96.1 98.4 96.6  PLT 246 264 229 366   Basic Metabolic Panel: Recent Labs  Lab 01/09/20 1244 01/10/20 0750 01/11/20 0751 01/12/20 0844  NA 133* 136 135 134*  K 3.4* 3.8 3.6 3.5  CL 102 106 105 103  CO2 19* 19* 18* 22  GLUCOSE 113* 115* 93 139*  BUN 38* 35* 27* 19  CREATININE 1.78* 1.52* 1.55* 1.30*  CALCIUM 8.4* 8.2* 8.0* 8.0*  MG  --   --  2.0  --     Studies: No results found.  Scheduled Meds: . amiodarone  100 mg Oral Once per day on Sun Sat  . amiodarone  200 mg Oral Once per day on Mon Tue Wed Thu Fri  . apixaban  5 mg Oral BID  . docusate sodium  100 mg Oral BID  . furosemide  40 mg Oral Daily  . Gerhardt's butt cream   Topical QID  . guaiFENesin  600 mg Oral BID  . multivitamin with minerals  1 tablet Oral Daily  . tamsulosin  0.4 mg Oral Daily   Continuous Infusions: . cefTRIAXone (ROCEPHIN)  IV 2 g (01/12/20 1149)   PRN Meds: acetaminophen **OR** acetaminophen, levalbuterol, traMADol  Time spent: 35 minutes  Author: Berle Mull, MD [facility] 01/12/2020 4:09 PM  To reach On-call, see care teams to locate the attending and reach out via [email or web address]. Between 7PM-7AM, please contact night-coverage If you still have difficulty reaching the attending provider, please page the [facility] (Director on Call) for [facility] on amion for assistance.

## 2020-01-13 LAB — RENAL FUNCTION PANEL
Albumin: 1.9 g/dL — ABNORMAL LOW (ref 3.5–5.0)
Anion gap: 10 (ref 5–15)
BUN: 16 mg/dL (ref 8–23)
CO2: 24 mmol/L (ref 22–32)
Calcium: 7.7 mg/dL — ABNORMAL LOW (ref 8.9–10.3)
Chloride: 102 mmol/L (ref 98–111)
Creatinine, Ser: 1.25 mg/dL — ABNORMAL HIGH (ref 0.61–1.24)
GFR calc Af Amer: >60 mL/min (ref >60)
GFR calc non Af Amer: 54 mL/min — ABNORMAL LOW (ref >60)
Glucose, Bld: 149 mg/dL — ABNORMAL HIGH (ref 70–99)
Phosphorus: 2.5 mg/dL (ref 2.5–4.6)
Potassium: 3 mmol/L — ABNORMAL LOW (ref 3.5–5.1)
Sodium: 136 mmol/L (ref 135–145)

## 2020-01-13 LAB — CBC
HCT: 31.9 % — ABNORMAL LOW (ref 39.0–52.0)
Hemoglobin: 10.7 g/dL — ABNORMAL LOW (ref 13.0–17.0)
MCH: 32.1 pg (ref 26.0–34.0)
MCHC: 33.5 g/dL (ref 30.0–36.0)
MCV: 95.8 fL (ref 80.0–100.0)
Platelets: 248 10*3/uL (ref 150–400)
RBC: 3.33 MIL/uL — ABNORMAL LOW (ref 4.22–5.81)
RDW: 13.5 % (ref 11.5–15.5)
WBC: 11 10*3/uL — ABNORMAL HIGH (ref 4.0–10.5)
nRBC: 0 % (ref 0.0–0.2)

## 2020-01-13 MED ORDER — POTASSIUM CHLORIDE CRYS ER 20 MEQ PO TBCR
40.0000 meq | EXTENDED_RELEASE_TABLET | ORAL | Status: AC
  Administered 2020-01-13 (×2): 40 meq via ORAL
  Filled 2020-01-13 (×2): qty 2

## 2020-01-13 MED ORDER — SALINE SPRAY 0.65 % NA SOLN
1.0000 | NASAL | Status: DC | PRN
  Administered 2020-01-13: 1 via NASAL
  Filled 2020-01-13: qty 44

## 2020-01-13 NOTE — Progress Notes (Signed)
SATURATION QUALIFICATIONS:  Patient Saturations on Room Air at Rest = 90%  Patient Saturations on Room Air while Ambulating = 86%  Patient Saturations on 2 Liters of oxygen while Ambulating = 95%   

## 2020-01-13 NOTE — TOC Initial Note (Addendum)
Transition of Care [facility]) - Initial/Assessment Note    Patient Details  Name: Bruce Cohen MRN: [id number] Date of Birth: 09-03-1940  Transition of Care [facility]) CM/SW Contact:    Marilu Favre, RN Phone Number: 01/13/2020, 10:36 AM  Clinical Narrative:                 Discussed PT recommendation of HHPT with patient. Provided medicare.gov list of home health agencies. Patient states his wife has had [facility] in past but he cannot remember the name of the agency. NCM offered to call his wife , patient declined stated he has no preference.   Cory with [facility] accepted referral.    Patient has a walker at home already. Patient currently on oxygen but does not have oxygen at home. Messaged MD to see if he wants to order oxygen ambulation saturation note. Await response.    1529 Received ambulation oxygenation saturation note, and order for home oxygen. Ordered home oxygen with Freda Munro with [facility], they will deliver portable tank to room and arrange delivery for home oxygen DME with patient/ family   Expected Discharge Plan: [place]     Patient Goals and CMS Choice Patient states their goals for this hospitalization and ongoing recovery are:: to return to home CMS Medicare.gov Compare Post Acute Care list provided to:: Patient Choice offered to / list presented to : Patient  Expected Discharge Plan and Services Expected Discharge Plan: [place]   Discharge Planning Services: CM Consult Post Acute Care Choice: [place] arrangements for the past 2 months: [place]: PT [place]: [place] Date [place]: 01/13/20 Time [place]: 0630 Representative spoke with at [place]: left message for [facility] awaiting call back  Prior Living Arrangements/Services Living arrangements for the past 2 months: Single Family Home Lives with:: Spouse,  Adult Children Patient language and need for interpreter reviewed:: Yes Do you feel safe going back to the place where you live?: Yes      Need for Family Participation in Patient Care: Yes (Comment) Care giver support system in place?: Yes (comment) Current home services: DME Criminal Activity/Legal Involvement Pertinent to Current Situation/Hospitalization: No - Comment as needed  Activities of Daily Living      Permission Sought/Granted   Permission granted to share information with : No              Emotional Assessment Appearance:: Appears stated age Attitude/Demeanor/Rapport: Engaged Affect (typically observed): Accepting Orientation: : Oriented to Self, Oriented to Place, Oriented to  Time, Oriented to Situation Alcohol / Substance Use: Not Applicable Psych Involvement: No (comment)  Admission diagnosis:  AKI (acute kidney injury) ([place]) [N17.9] Sepsis ([place]) [A41.9] Sepsis with acute renal failure without septic shock, due to unspecified organism, unspecified acute renal failure type ([place]) [A41.9, R65.20, N17.9] Patient Active Problem List   Diagnosis Date Noted  . Acute lower UTI 01/09/2020  . Sepsis ([place]) 01/09/2020  . AKI (acute kidney injury) ([place])   . Thoracic ascending aortic aneurysm ([place]) 04/26/2016  . NICM (nonischemic cardiomyopathy) ([place]) 01/01/2016  . High risk medication use 01/01/2016  . LVH (left ventricular hypertrophy) 12/01/2015  . Bradycardia 12/01/2015  . CAD (coronary artery disease) 10/12/2015  . OSA (obstructive sleep apnea) 02/12/2015  .  Persistent atrial fibrillation ([place])   . Acute respiratory failure with hypoxia ([place]) 09/29/2014  . Pulmonary edema, acute ([place]) 09/29/2014  . HTN (hypertension) 09/29/2014  . Patient nonadherence 09/29/2014  . COPD (chronic obstructive pulmonary disease) ([place]) 09/29/2014  . Tobacco abuse 09/29/2014  . CKD (chronic kidney disease), stage II 09/29/2014  . Interstitial lung disease ([place])   . Nonspecific  abnormal unspecified cardiovascular function study 06/28/2013  . Mixed hyperlipidemia 06/28/2013   PCP:  Josetta Huddle, MD Pharmacy:   [facility], [place] [address] Phone: [phone] Fax: [phone]  [id number] [place], [place] [address] [place] [address] Phone: [phone] Fax: [phone]     Social Determinants of Health (SDOH) Interventions    Readmission Risk Interventions No flowsheet data found.

## 2020-01-13 NOTE — Plan of Care (Signed)
  Problem: Urinary Elimination: Goal: Signs and symptoms of infection will decrease Outcome: Progressing   Problem: Education: Goal: Knowledge of General Education information will improve Description: Including pain rating scale, medication(s)/side effects and non-pharmacologic comfort measures Outcome: Progressing   Problem: Health Behavior/Discharge Planning: Goal: Ability to manage health-related needs will improve Outcome: Progressing   Problem: Clinical Measurements: Goal: Ability to maintain clinical measurements within normal limits will improve Outcome: Progressing Goal: Will remain free from infection Outcome: Progressing Goal: Respiratory complications will improve Outcome: Progressing Goal: Cardiovascular complication will be avoided Outcome: Progressing   Problem: Activity: Goal: Risk for activity intolerance will decrease Outcome: Progressing   Problem: Elimination: Goal: Will not experience complications related to bowel motility Outcome: Progressing Goal: Will not experience complications related to urinary retention Outcome: Progressing   Problem: Pain Managment: Goal: General experience of comfort will improve Outcome: Progressing   Problem: Safety: Goal: Ability to remain free from injury will improve Outcome: Progressing   Problem: Skin Integrity: Goal: Risk for impaired skin integrity will decrease Outcome: Progressing

## 2020-01-13 NOTE — Progress Notes (Signed)
[facility] Progress Note  Patient: Bruce Cohen    ZOX:[id number]  DOA: 01/09/2020     Date of Service: the patient was seen and examined on 01/13/2020  Brief hospital course: Bruce Albornoz Telfordis a 79 y.o.malewith medical history significant ofCAD,chronic systolic CHF, cardiomyopathy,paroxysmal A. fib, COPD, hypertension, hyperlipidemia, thoracic aneurysm, presented with dysuria. Symptoms started 3 days ago,with urinary frequency, urine incontinent,lower abdominal discomfort,episodes of chills but no fever, no nauseous vomiting.    Currently plan is continue antibiotics.  Assessment and Plan: 1.  Sepsis due to UTI POA from E. coli Gram-negative sepsis. Continues to report chills but fever curve is normal. Significant scrotal swelling but currently no evidence of gangrene or ulceration or abscess. Blood cultures positive for E. coli. Continue with IV ceftriaxone. Ct abdomen and pelvis unremarkable Unable to perform IV contrast due to patient's renal function.  2.  Acute kidney injury on chronic renal disease stage IIIa Renal function improving. Likely in the setting of sepsis and dehydration. Monitor.  IV fluids stopped. Treated with albumin and diuresis. Monitor renal function now that patient is on oral Lasix.  3.  Acute chronic systolic CHF Initially on admission hypovolemic.  Received IV fluids. Currently mild wheezing. Discontinue fluid.   remains volume overloaded. Received IV albumin along with Lasix. Resume oral Lasix.  4.  Essential hypertension Blood pressure on hold due to sepsis.  Monitor.  5.  Hypokalemia Replaced.  6.  Paroxysmal A. fib Continue amiodarone.  Continue Eliquis.  Diet: Cardiac diet DVT Prophylaxis:  apixaban (ELIQUIS) tablet 5 mg    Advance goals of care discussion: Full code  Family Communication: no family was present at bedside, at the time of interview.   Disposition:  Status is: Inpatient  Remains inpatient  appropriate because:IV treatments appropriate due to intensity of illness or inability to take PO  Dispo: The patient is from: Home              Anticipated d/c is to: Home              Anticipated d/c date is: 2 days              Patient currently is not medically stable to d/c.  Subjective: Feeling somewhat better this morning. Overnight had chills and fever.  No nausea no vomiting.  No pain anywhere.  Continues to have shortness of breath.  Physical Exam:  General: Appear in mild distress, no rash, scrotal redness without any tenderness; Oral Mucosa Clear, moist. no Abnormal Neck Mass Or lumps, Conjunctiva normal  Cardiovascular: S1 and S2 Present, no Murmur, Respiratory: Normal respiratory effort, Bilateral Air entry present and improving bilateral  Crackles, no wheezes Abdomen: Bowel Sound present, Soft and no tenderness Extremities: no Pedal edema, no calf tenderness Neurology: alert and oriented to time, place, and person affect appropriate. no new focal deficit Gait not checked due to patient safety concerns  Vitals:   01/12/20 2233 01/13/20 0507 01/13/20 1430 01/13/20 1559  BP:  130/62 130/64   Pulse:  78 73   Resp: 17 17 19    Temp:  98.2 F (36.8 C) 98.6 F (37 C) 98.3 F (36.8 C)  TempSrc:  Oral Oral   SpO2:  98% 99%   Weight:      Height:        Intake/Output Summary (Last 24 hours) at 01/13/2020 1934 Last data filed at 01/13/2020 1758 Gross per 24 hour  Intake 440 ml  Output 1850 ml  Net -1410 ml  Filed Weights   01/09/20 1146 01/09/20 1211 01/09/20 2108  Weight: 89.8 kg 89.8 kg 94.4 kg    Data Reviewed: I have personally reviewed and interpreted daily labs, tele strips, imagings as discussed above. I reviewed all nursing notes, pharmacy notes, vitals, pertinent old records I have discussed plan of care as described above with RN and patient/family.  CBC: Recent Labs  Lab 01/09/20 1244 01/10/20 0205 01/11/20 0751 01/12/20 0844 01/13/20 1007  WBC  22.3* 22.5* 16.7* 13.9* 11.0*  NEUTROABS 20.5*  --  13.3*  --   --   HGB 12.0* 10.9* 12.2* 11.5* 10.7*  HCT 35.5* 31.8* 36.7* 34.1* 31.9*  MCV 95.7 96.1 98.4 96.6 95.8  PLT 246 264 229 216 409   Basic Metabolic Panel: Recent Labs  Lab 01/09/20 1244 01/10/20 0750 01/11/20 0751 01/12/20 0844 01/13/20 1007  NA 133* 136 135 134* 136  K 3.4* 3.8 3.6 3.5 3.0*  CL 102 106 105 103 102  CO2 19* 19* 18* 22 24  GLUCOSE 113* 115* 93 139* 149*  BUN 38* 35* 27* 19 16  CREATININE 1.78* 1.52* 1.55* 1.30* 1.25*  CALCIUM 8.4* 8.2* 8.0* 8.0* 7.7*  MG  --   --  2.0  --   --   PHOS  --   --   --   --  2.5    Studies: No results found.  Scheduled Meds: . amiodarone  100 mg Oral Once per day on Sun Sat  . amiodarone  200 mg Oral Once per day on Mon Tue Wed Thu Fri  . apixaban  5 mg Oral BID  . docusate sodium  100 mg Oral BID  . furosemide  40 mg Oral Daily  . Gerhardt's butt cream   Topical QID  . guaiFENesin  600 mg Oral BID  . multivitamin with minerals  1 tablet Oral Daily  . tamsulosin  0.4 mg Oral Daily   Continuous Infusions: . cefTRIAXone (ROCEPHIN)  IV 2 g (01/13/20 0953)   PRN Meds: acetaminophen **OR** acetaminophen, levalbuterol, sodium chloride, traMADol  Time spent: 35 minutes  Author: Berle Mull, MD [facility] 01/13/2020 7:34 PM  To reach On-call, see care teams to locate the attending and reach out via [email or web address]. Between 7PM-7AM, please contact night-coverage If you still have difficulty reaching the attending provider, please page the [facility] (Director on Call) for [facility] on amion for assistance.

## 2020-01-14 LAB — CBC WITH DIFFERENTIAL/PLATELET
Abs Immature Granulocytes: 0.3 10*3/uL — ABNORMAL HIGH (ref 0.00–0.07)
Basophils Absolute: 0 10*3/uL (ref 0.0–0.1)
Basophils Relative: 0 %
Eosinophils Absolute: 0.2 10*3/uL (ref 0.0–0.5)
Eosinophils Relative: 2 %
HCT: 33.3 % — ABNORMAL LOW (ref 39.0–52.0)
Hemoglobin: 11.2 g/dL — ABNORMAL LOW (ref 13.0–17.0)
Lymphocytes Relative: 14 %
Lymphs Abs: 1.6 10*3/uL (ref 0.7–4.0)
MCH: 32.1 pg (ref 26.0–34.0)
MCHC: 33.6 g/dL (ref 30.0–36.0)
MCV: 95.4 fL (ref 80.0–100.0)
Metamyelocytes Relative: 3 %
Monocytes Absolute: 0.5 10*3/uL (ref 0.1–1.0)
Monocytes Relative: 4 %
Neutro Abs: 8.9 10*3/uL — ABNORMAL HIGH (ref 1.7–7.7)
Neutrophils Relative %: 77 %
Platelets: 308 10*3/uL (ref 150–400)
RBC: 3.49 MIL/uL — ABNORMAL LOW (ref 4.22–5.81)
RDW: 13.4 % (ref 11.5–15.5)
WBC: 11.5 10*3/uL — ABNORMAL HIGH (ref 4.0–10.5)
nRBC: 0 % (ref 0.0–0.2)
nRBC: 0 /100 WBC

## 2020-01-14 LAB — RENAL FUNCTION PANEL
Albumin: 2 g/dL — ABNORMAL LOW (ref 3.5–5.0)
Anion gap: 10 (ref 5–15)
BUN: 15 mg/dL (ref 8–23)
CO2: 25 mmol/L (ref 22–32)
Calcium: 8.1 mg/dL — ABNORMAL LOW (ref 8.9–10.3)
Chloride: 102 mmol/L (ref 98–111)
Creatinine, Ser: 1.26 mg/dL — ABNORMAL HIGH (ref 0.61–1.24)
GFR calc Af Amer: >60 mL/min (ref >60)
GFR calc non Af Amer: 54 mL/min — ABNORMAL LOW (ref >60)
Glucose, Bld: 96 mg/dL (ref 70–99)
Phosphorus: 3.1 mg/dL (ref 2.5–4.6)
Potassium: 3.8 mmol/L (ref 3.5–5.1)
Sodium: 137 mmol/L (ref 135–145)

## 2020-01-14 LAB — CULTURE, BLOOD (ROUTINE X 2)

## 2020-01-14 LAB — SARS CORONAVIRUS 2 (TAT 6-24 HRS): SARS Coronavirus 2: NEGATIVE

## 2020-01-14 LAB — MAGNESIUM: Magnesium: 2.1 mg/dL (ref 1.7–2.4)

## 2020-01-14 MED ORDER — CEFDINIR 300 MG PO CAPS: 300.0000 mg | ORAL_CAPSULE | Freq: Two times a day (BID) | ORAL | 0 refills | Status: DC

## 2020-01-14 MED ORDER — TAMSULOSIN HCL 0.4 MG PO CAPS: 0.4000 mg | ORAL_CAPSULE | Freq: Every day | ORAL | 0 refills | Status: AC

## 2020-01-14 MED ORDER — GUAIFENESIN ER 600 MG PO TB12: 600.0000 mg | ORAL_TABLET | Freq: Two times a day (BID) | ORAL | 0 refills | Status: DC

## 2020-01-14 MED ORDER — GERHARDT'S BUTT CREAM: 1.0000 "application " | TOPICAL_CREAM | Freq: Four times a day (QID) | CUTANEOUS | 0 refills | Status: AC

## 2020-01-14 NOTE — Social Work (Signed)
Pt referral securely sent to area SNFs, auth started ref #[id number] through [facility] Medicare.  Westley Hummer, MSW, [place] Work

## 2020-01-14 NOTE — Care Management Important Message (Signed)
Important Message  Patient Details  Name: Bruce Cohen MRN: [id number] Date of Birth: July 05, 1941   Medicare Important Message Given:  Yes     Orbie Pyo 01/14/2020, 2:13 PM

## 2020-01-14 NOTE — Care Management (Addendum)
Provided bed offers to patient with medicare.gov ratings. With patient permission called son Leonor Liv and also provided offers. Both will review offers and discuss this evening. TOC will follow up in am for choice.  Secure chat MD for new covid test.   Magdalen Spatz RN

## 2020-01-14 NOTE — Plan of Care (Signed)

## 2020-01-14 NOTE — TOC Progression Note (Signed)
Transition of Care [facility]) - Progression Note    Patient Details  Name: Bruce Cohen MRN: [id number] Date of Birth: May 30, 1941  Transition [facility] Care [facility]) CM/SW Contact  Jacalyn Lefevre Edson Snowball, RN Phone Number: 01/14/2020, 12:45 PM  Clinical Narrative:     Patient now agreeable to SNF, [facility] team will start auth .   Will call [facility] regarding home oxygen   Expected Discharge Plan: [place]    Expected Discharge Plan and Services Expected Discharge Plan: [place]   Discharge Planning Services: CM Consult Post Acute Care Choice: [place] arrangements for the past 2 months: Single Family Home Expected Discharge Date: 01/14/20                         HH Arranged: PT HH Agency: [facility] Date [place]: 01/13/20 Time [place]: 1657 Representative spoke with at [place]: left message for [facility] awaiting call back   Social Determinants of Health (SDOH) Interventions    Readmission Risk Interventions No flowsheet data found.

## 2020-01-14 NOTE — NC FL2 (Signed)
[place] LEVEL OF CARE SCREENING TOOL     IDENTIFICATION  Patient Name: Bruce Cohen Birthdate: [birth year] Sex: male Admission Date (Current Location): 01/09/2020  [facility] and [place] Number:  [employer] and Address:  [facility], [place] [address], [place], [place] [address]      Provider Number: [id number]  Attending Physician Name and Address:  Lavina Hamman, MD  Relative Name and Phone Number:       Current Level of Care: Hospital Recommended Level of Care: [place] Prior Approval Number:    Date Approved/Denied:   PASRR Number: [id number]  Discharge Plan: SNF    Current Diagnoses: Patient Active Problem List   Diagnosis Date Noted  . Acute lower UTI 01/09/2020  . Sepsis ([place]) 01/09/2020  . AKI (acute kidney injury) ([place])   . Thoracic ascending aortic aneurysm ([place]) 04/26/2016  . NICM (nonischemic cardiomyopathy) ([place]) 01/01/2016  . High risk medication use 01/01/2016  . LVH (left ventricular hypertrophy) 12/01/2015  . Bradycardia 12/01/2015  . CAD (coronary artery disease) 10/12/2015  . OSA (obstructive sleep apnea) 02/12/2015  . Persistent atrial fibrillation ([place])   . Acute respiratory failure with hypoxia ([place]) 09/29/2014  . Pulmonary edema, acute ([place]) 09/29/2014  . HTN (hypertension) 09/29/2014  . Patient nonadherence 09/29/2014  . COPD (chronic obstructive pulmonary disease) ([place]) 09/29/2014  . Tobacco abuse 09/29/2014  . CKD (chronic kidney disease), stage II 09/29/2014  . Interstitial lung disease ([place])   . Nonspecific abnormal unspecified cardiovascular function study 06/28/2013  . Mixed hyperlipidemia 06/28/2013    Orientation RESPIRATION BLADDER Height & Weight     Self, Time, Situation, Place  O2 (2L nasal canula) Incontinent Weight: 208 lb 1.8 oz (94.4 kg) Height:  6\' 3"  (190.5 cm)  BEHAVIORAL SYMPTOMS/MOOD NEUROLOGICAL BOWEL NUTRITION STATUS      Continent Diet (see  discharge summary)  AMBULATORY STATUS COMMUNICATION OF NEEDS Skin   Limited Assist Verbally Other (Comment) (MASD on scrotum)                       Personal Care Assistance Level of Assistance  Bathing, Dressing, Feeding Bathing Assistance: Limited assistance Feeding assistance: Independent Dressing Assistance: Limited assistance     Functional Limitations Info  Sight, Hearing, Speech Sight Info: Adequate Hearing Info: Impaired Speech Info: Adequate    SPECIAL CARE FACTORS FREQUENCY  OT (By licensed OT), PT (By licensed PT)     PT Frequency: 5x week OT Frequency: 5x week            Contractures Contractures Info: Not present    Additional Factors Info  Code Status, Allergies Code Status Info: Full Code Allergies Info: No Known Allergies           Current Medications (01/14/2020):  This is the current hospital active medication list Current Facility-Administered Medications  Medication Dose Route Frequency Provider Last Rate Last Admin  . acetaminophen (TYLENOL) tablet 650 mg  650 mg Oral Q6H PRN Wynetta Fines T, MD   650 mg at 01/13/20 1605   Or  . acetaminophen (TYLENOL) suppository 650 mg  650 mg Rectal Q6H PRN Wynetta Fines T, MD      . amiodarone (PACERONE) tablet 100 mg  100 mg Oral Once per day on Sun Sat Wynetta Fines T, MD   100 mg at 01/12/20 1018  . amiodarone (PACERONE) tablet 200 mg  200 mg Oral Once per day on Mon Tue Wed Thu  Ludwig Clarks Lequita Halt, MD   200 mg at 01/14/20 1059  . apixaban (ELIQUIS) tablet 5 mg  5 mg Oral BID Wynetta Fines T, MD   5 mg at 01/14/20 1013  . cefTRIAXone (ROCEPHIN) 2 g in sodium chloride 0.9 % 100 mL IVPB  2 g Intravenous Q24H von Dohlen, Haley B, RPH 200 mL/hr at 01/14/20 1022 2 g at 01/14/20 1022  . docusate sodium (COLACE) capsule 100 mg  100 mg Oral BID Lavina Hamman, MD   100 mg at 01/14/20 1014  . furosemide (LASIX) tablet 40 mg  40 mg Oral Daily Lavina Hamman, MD   40 mg at 01/14/20 1019  . Gerhardt's butt cream   Topical  QID Lavina Hamman, MD   Given at 01/14/20 1100  . guaiFENesin (MUCINEX) 12 hr tablet 600 mg  600 mg Oral BID Lavina Hamman, MD   600 mg at 01/14/20 1015  . levalbuterol (XOPENEX) nebulizer solution 0.63 mg  0.63 mg Nebulization Q6H PRN Lavina Hamman, MD      . multivitamin with minerals tablet 1 tablet  1 tablet Oral Daily Lequita Halt, MD   1 tablet at 01/14/20 1014  . sodium chloride (OCEAN) 0.65 % nasal spray 1 spray  1 spray Each Nare PRN Lavina Hamman, MD   1 spray at 01/13/20 1726  . tamsulosin (FLOMAX) capsule 0.4 mg  0.4 mg Oral Daily Lavina Hamman, MD   0.4 mg at 01/14/20 1017  . traMADol (ULTRAM) tablet 50-100 mg  50-100 mg Oral Q6H PRN Lequita Halt, MD   50 mg at 01/12/20 2237     Discharge Medications: Please see discharge summary for a list of discharge medications.  Relevant Imaging Results:  Relevant Lab Results:   Additional Information SS#[id number]  [place]

## 2020-01-14 NOTE — Progress Notes (Signed)
[facility] Progress Note  Patient: Bruce Cohen    QJJ:[id number]  DOA: 01/09/2020     Date of Service: the patient was seen and examined on 01/14/2020  Brief hospital course: Wilbur [place] Telfordis a 79 y.o.malewith medical history significant ofCAD,chronic systolic CHF, cardiomyopathy,paroxysmal A. fib, COPD, hypertension, hyperlipidemia, thoracic aneurysm, presented with dysuria. Symptoms started 3 days ago,with urinary frequency, urine incontinent,lower abdominal discomfort,episodes of chills but no fever, no nauseous vomiting.    Currently plan is arrange for outpatient discharge to SNF  Assessment and Plan: 1.  Sepsis due to UTI POA from E. coli Gram-negative sepsis. Continues to report chills but fever curve is normal. Significant scrotal swelling but currently no evidence of gangrene or ulceration or abscess. Blood cultures positive for E. coli. Treated with with IV ceftriaxone.  Change to oral antibiotics Ct abdomen and pelvis unremarkable Unable to perform IV contrast due to patient's renal function.  2.  Acute kidney injury on chronic renal disease stage IIIa Renal function improving. Likely in the setting of sepsis and dehydration. Monitor.  IV fluids stopped. Treated with albumin and diuresis. Monitor renal function now that patient is on oral Lasix.  3.  Acute chronic systolic CHF Initially on admission hypovolemic.  Received IV fluids. Currently mild wheezing. Discontinue fluid.   remains volume overloaded. Received IV albumin along with Lasix. Resume oral Lasix.  4.  Essential hypertension Blood pressure on hold due to sepsis.  Monitor.  5.  Hypokalemia Replaced.  6.  Paroxysmal A. fib Continue amiodarone.  Continue Eliquis.  7.  Deconditioning. Initial plan was to go home with home health based on PT recommendation. PT OT evaluation suggest that the patient should go to SNF. Family also unable to take care of the patient at  home. Currently awaiting insurance authorization for SNF.  Diet: Cardiac diet DVT Prophylaxis:  apixaban (ELIQUIS) tablet 5 mg    Advance goals of care discussion: Full code  Family Communication: no family was present at bedside, at the time of interview.  Discussed with wife on phone  Disposition:  Status is: Inpatient  Remains inpatient appropriate because:Unsafe discharge.  Unable to take care of himself and family unable to take care of patient as well.  Dispo: The patient is from: Home              Anticipated d/c is to: SNF              Anticipated d/c date is: Tomorrow              Patient currently is not medically stable to d/c.  Subjective: Feeling somewhat better this morning. Overnight had chills and fever.  No nausea no vomiting.  No pain anywhere.  Continues to have shortness of breath.  Physical Exam:  General: Appear in mild distress, no rash, scrotal redness without any tenderness; Oral Mucosa Clear, moist. no Abnormal Neck Mass Or lumps, Conjunctiva normal  Cardiovascular: S1 and S2 Present, no Murmur, Respiratory: Normal respiratory effort, Bilateral Air entry present and improving bilateral  Crackles, no wheezes Abdomen: Bowel Sound present, Soft and no tenderness Extremities: no Pedal edema, no calf tenderness Neurology: alert and oriented to time, place, and person affect appropriate. no new focal deficit Gait not checked due to patient safety concerns  Vitals:   01/13/20 1559 01/13/20 2019 01/14/20 0452 01/14/20 1400  BP:  (!) 107/54 119/60 105/60  Pulse:  64 62 72  Resp:  20 20   Temp: 98.3 F (36.8 C)  98.5 F (36.9 C) 100 F (37.8 C) 98.2 F (36.8 C)  TempSrc:  Oral Oral Oral  SpO2:  97% 100%   Weight:      Height:        Intake/Output Summary (Last 24 hours) at 01/14/2020 1850 Last data filed at 01/13/2020 2020 Gross per 24 hour  Intake --  Output 200 ml  Net -200 ml   Filed Weights   01/09/20 1146 01/09/20 1211 01/09/20 2108  Weight:  89.8 kg 89.8 kg 94.4 kg    Data Reviewed: I have personally reviewed and interpreted daily labs, tele strips, imagings as discussed above. I reviewed all nursing notes, pharmacy notes, vitals, pertinent old records I have discussed plan of care as described above with RN and patient/family.  CBC: Recent Labs  Lab 01/09/20 1244 01/09/20 1244 01/10/20 0205 01/11/20 0751 01/12/20 0844 01/13/20 1007 01/14/20 0215  WBC 22.3*   < > 22.5* 16.7* 13.9* 11.0* 11.5*  NEUTROABS 20.5*  --   --  13.3*  --   --  8.9*  HGB 12.0*   < > 10.9* 12.2* 11.5* 10.7* 11.2*  HCT 35.5*   < > 31.8* 36.7* 34.1* 31.9* 33.3*  MCV 95.7   < > 96.1 98.4 96.6 95.8 95.4  PLT 246   < > 264 229 216 248 308   < > = values in this interval not displayed.   Basic Metabolic Panel: Recent Labs  Lab 01/10/20 0750 01/11/20 0751 01/12/20 0844 01/13/20 1007 01/14/20 0215  NA 136 135 134* 136 137  K 3.8 3.6 3.5 3.0* 3.8  CL 106 105 103 102 102  CO2 19* 18* 22 24 25   GLUCOSE 115* 93 139* 149* 96  BUN 35* 27* 19 16 15   CREATININE 1.52* 1.55* 1.30* 1.25* 1.26*  CALCIUM 8.2* 8.0* 8.0* 7.7* 8.1*  MG  --  2.0  --   --  2.1  PHOS  --   --   --  2.5 3.1    Studies: No results found.  Scheduled Meds: . amiodarone  100 mg Oral Once per day on Sun Sat  . amiodarone  200 mg Oral Once per day on Mon Tue Wed Thu Fri  . apixaban  5 mg Oral BID  . docusate sodium  100 mg Oral BID  . furosemide  40 mg Oral Daily  . Gerhardt's butt cream   Topical QID  . guaiFENesin  600 mg Oral BID  . multivitamin with minerals  1 tablet Oral Daily  . tamsulosin  0.4 mg Oral Daily   Continuous Infusions: . cefTRIAXone (ROCEPHIN)  IV 2 g (01/14/20 1022)   PRN Meds: acetaminophen **OR** acetaminophen, levalbuterol, sodium chloride, traMADol  Time spent: 35 minutes  Author: Berle Mull, MD [facility] 01/14/2020 6:50 PM  To reach On-call, see care teams to locate the attending and reach out via [email or web address]. Between  7PM-7AM, please contact night-coverage If you still have difficulty reaching the attending provider, please page the [facility] (Director on Call) for [facility] on amion for assistance.

## 2020-01-14 NOTE — Plan of Care (Signed)
  Problem: Urinary Elimination: Goal: Signs and symptoms of infection will decrease Outcome: Progressing   

## 2020-01-14 NOTE — Progress Notes (Signed)
Physical Therapy Treatment Patient Details Name: Bruce Cohen MRN: [id number] DOB: Jul 08, 1941 Today's Date: 01/14/2020    History of Present Illness Pt is a 39 y..M with significant PMH of CAD, CHF, cardiomyopathy, A. fib, COPD, hypertension, who presented with dysuria. Admitted with sepsis due to UTI.     PT Comments    Pt relays that he is surprised how weak he has gotten and that he still needs O2 and he is nervous about going home where his wife and son cannot physically assist him. He is agreeable to skilled rehab at this point. Pt ambulated 15' with RW at which point he became dizzy and 3/4 DOE and needed to sit quickly. SPO2 read 96% on 2L but pt visibly short of breath and could not speak. Needed min A to stand after this and step to recliner from foot of bed. Agree with pt that going straight home is not a safe plan for him at this time and he would benefit from additional rehab. PT will continue to follow.    Follow Up Recommendations  SNF;Supervision/Assistance - 24 hour     Equipment Recommendations  None recommended by PT    Recommendations for Other Services       Precautions / Restrictions Precautions Precautions: Fall Restrictions Weight Bearing Restrictions: No    Mobility  Bed Mobility Overal bed mobility: Needs Assistance Bed Mobility: Supine to Sit     Supine to sit: Supervision     General bed mobility comments: pt able to come to EOB with HOB elevated and use of rail, no physical assist  Transfers Overall transfer level: Needs assistance Equipment used: Rolling walker (2 wheeled) Transfers: Sit to/from Stand Sit to Stand: Min guard;Min assist         General transfer comment: min-guard initially from bed but second 2 times pt stood he needed min A to steady  Ambulation/Gait Ambulation/Gait assistance: Min guard Gait Distance (Feet): 15 Feet Assistive device: Rolling walker (2 wheeled) Gait Pattern/deviations: Step-through  pattern;Decreased stride length;Trunk flexed Gait velocity: decreased Gait velocity interpretation: <1.31 ft/sec, indicative of household ambulator General Gait Details: after 62' with RW pt very SOB and dizzy and needed to sit. SPO2 96% on 2L O2 but pt with 3/4 DOE.    Stairs             Wheelchair Mobility    Modified Rankin (Stroke Patients Only)       Balance Overall balance assessment: Needs assistance Sitting-balance support: Feet supported Sitting balance-Leahy Scale: Good     Standing balance support: Bilateral upper extremity supported;During functional activity Standing balance-Leahy Scale: Poor Standing balance comment: able to maintain static stance for short period without UE support to hold urinal. Unable to accept challenge                            Cognition Arousal/Alertness: Awake/alert Behavior During Therapy: WFL for tasks assessed/performed Overall Cognitive Status: Within Functional Limits for tasks assessed                                        Exercises      General Comments        Pertinent Vitals/Pain Pain Assessment: No/denies pain    Home Living  Prior Function            PT Goals (current goals can now be found in the care plan section) Acute Rehab PT Goals Patient Stated Goal: feel better PT Goal Formulation: With patient Time For Goal Achievement: 01/26/20 Potential to Achieve Goals: Good Progress towards PT goals: Not progressing toward goals - comment (fatigue and weakness)    Frequency    Min 3X/week      PT Plan Discharge plan needs to be updated    Co-evaluation              AM-PAC PT "6 Clicks" Mobility   Outcome Measure  Help needed turning from your back to your side while in a flat bed without using bedrails?: None Help needed moving from lying on your back to sitting on the side of a flat bed without using bedrails?: A Little Help  needed moving to and from a bed to a chair (including a wheelchair)?: A Little Help needed standing up from a chair using your arms (e.g., wheelchair or bedside chair)?: A Little Help needed to walk in hospital room?: A Lot Help needed climbing 3-5 steps with a railing? : Total 6 Click Score: 16    End of Session Equipment Utilized During Treatment: Oxygen;Gait belt Activity Tolerance: Patient limited by fatigue Patient left: with call bell/phone within reach;in chair;with chair alarm set Nurse Communication: Mobility status PT Visit Diagnosis: Difficulty in walking, not elsewhere classified (R26.2);Unsteadiness on feet (R26.81)     Time: 7106-2694 PT Time Calculation (min) (ACUTE ONLY): 36 min  Charges:  $Gait Training: 8-22 mins $Therapeutic Activity: 8-22 mins                     Leighton Roach, PT  Acute Rehab Services  Pager [phone] Office [place] 01/14/2020, 1:11 PM

## 2020-01-15 DIAGNOSIS — I5023 Acute on chronic systolic (congestive) heart failure: Secondary | ICD-10-CM

## 2020-01-15 DIAGNOSIS — I440 Atrioventricular block, first degree: Secondary | ICD-10-CM | POA: Diagnosis present

## 2020-01-15 DIAGNOSIS — A4151 Sepsis due to Escherichia coli [E. coli]: Principal | ICD-10-CM

## 2020-01-15 DIAGNOSIS — D72829 Elevated white blood cell count, unspecified: Secondary | ICD-10-CM | POA: Diagnosis present

## 2020-01-15 DIAGNOSIS — D689 Coagulation defect, unspecified: Secondary | ICD-10-CM | POA: Diagnosis present

## 2020-01-15 DIAGNOSIS — R7881 Bacteremia: Secondary | ICD-10-CM | POA: Diagnosis present

## 2020-01-15 DIAGNOSIS — J449 Chronic obstructive pulmonary disease, unspecified: Secondary | ICD-10-CM

## 2020-01-15 DIAGNOSIS — E8809 Other disorders of plasma-protein metabolism, not elsewhere classified: Secondary | ICD-10-CM | POA: Diagnosis present

## 2020-01-15 DIAGNOSIS — D638 Anemia in other chronic diseases classified elsewhere: Secondary | ICD-10-CM

## 2020-01-15 DIAGNOSIS — N390 Urinary tract infection, site not specified: Secondary | ICD-10-CM

## 2020-01-15 DIAGNOSIS — I2510 Atherosclerotic heart disease of native coronary artery without angina pectoris: Secondary | ICD-10-CM

## 2020-01-15 DIAGNOSIS — I4510 Unspecified right bundle-branch block: Secondary | ICD-10-CM | POA: Diagnosis present

## 2020-01-15 DIAGNOSIS — I130 Hypertensive heart and chronic kidney disease with heart failure and stage 1 through stage 4 chronic kidney disease, or unspecified chronic kidney disease: Secondary | ICD-10-CM

## 2020-01-15 DIAGNOSIS — B9620 Unspecified Escherichia coli [E. coli] as the cause of diseases classified elsewhere: Secondary | ICD-10-CM | POA: Diagnosis present

## 2020-01-15 DIAGNOSIS — R5381 Other malaise: Secondary | ICD-10-CM | POA: Diagnosis present

## 2020-01-15 DIAGNOSIS — D6869 Other thrombophilia: Secondary | ICD-10-CM | POA: Diagnosis present

## 2020-01-15 DIAGNOSIS — R7989 Other specified abnormal findings of blood chemistry: Secondary | ICD-10-CM | POA: Diagnosis present

## 2020-01-15 DIAGNOSIS — I10 Essential (primary) hypertension: Secondary | ICD-10-CM

## 2020-01-15 DIAGNOSIS — R739 Hyperglycemia, unspecified: Secondary | ICD-10-CM

## 2020-01-15 DIAGNOSIS — B999 Unspecified infectious disease: Secondary | ICD-10-CM

## 2020-01-15 DIAGNOSIS — I5022 Chronic systolic (congestive) heart failure: Secondary | ICD-10-CM

## 2020-01-15 DIAGNOSIS — N183 Chronic kidney disease, stage 3 unspecified: Secondary | ICD-10-CM

## 2020-01-15 DIAGNOSIS — E876 Hypokalemia: Secondary | ICD-10-CM | POA: Diagnosis present

## 2020-01-15 DIAGNOSIS — R809 Proteinuria, unspecified: Secondary | ICD-10-CM | POA: Diagnosis present

## 2020-01-15 DIAGNOSIS — E872 Acidosis, unspecified: Secondary | ICD-10-CM | POA: Diagnosis present

## 2020-01-15 DIAGNOSIS — J90 Pleural effusion, not elsewhere classified: Secondary | ICD-10-CM

## 2020-01-15 DIAGNOSIS — E871 Hypo-osmolality and hyponatremia: Secondary | ICD-10-CM | POA: Diagnosis present

## 2020-01-15 LAB — RENAL FUNCTION PANEL
Albumin: 2 g/dL — ABNORMAL LOW (ref 3.5–5.0)
Anion gap: 9 (ref 5–15)
BUN: 17 mg/dL (ref 8–23)
CO2: 27 mmol/L (ref 22–32)
Calcium: 8.1 mg/dL — ABNORMAL LOW (ref 8.9–10.3)
Chloride: 101 mmol/L (ref 98–111)
Creatinine, Ser: 1.29 mg/dL — ABNORMAL HIGH (ref 0.61–1.24)
GFR calc Af Amer: >60 mL/min (ref >60)
GFR calc non Af Amer: 52 mL/min — ABNORMAL LOW (ref >60)
Glucose, Bld: 92 mg/dL (ref 70–99)
Phosphorus: 3.6 mg/dL (ref 2.5–4.6)
Potassium: 4 mmol/L (ref 3.5–5.1)
Sodium: 137 mmol/L (ref 135–145)

## 2020-01-15 MED ORDER — CEFDINIR 300 MG PO CAPS
300.0000 mg | ORAL_CAPSULE | Freq: Two times a day (BID) | ORAL | Status: DC
  Administered 2020-01-15 – 2020-01-16 (×3): 300 mg via ORAL
  Filled 2020-01-15 (×3): qty 1

## 2020-01-15 NOTE — Progress Notes (Signed)
[facility] Progress Note  Patient: Bruce Cohen    JJK:[id number]  DOA: 01/09/2020     Date of Service: the patient was seen and examined on 01/15/2020  Brief hospital course: Pawel Soules Telfordis a 79 y.o.malewith medical history significant ofCAD,chronic systolic CHF, cardiomyopathy,paroxysmal A. fib, COPD, hypertension, hyperlipidemia, thoracic aneurysm, presented with dysuria. Symptoms started 3 days ago,with urinary frequency, urine incontinent,lower abdominal discomfort,episodes of chills but no fever, no nauseous vomiting. Currently plan is arrange for outpatient discharge to SNF  Assessment and Plan: Active Problems:   Mixed hyperlipidemia   HTN (hypertension)   Patient nonadherence   COPD (chronic obstructive pulmonary disease) (HCC)   History of tobacco abuse   Interstitial lung disease (HCC)   Persistent atrial fibrillation (HCC)   CAD (coronary artery disease)   LVH (left ventricular hypertrophy)   NICM (nonischemic cardiomyopathy) (HCC)   High risk medication use   Thoracic ascending aortic aneurysm ([place])   Acute lower UTI   Sepsis ([place]) - Criteria met with SIRS + E. Coli bacteremia + AKI and lactic acidosis   AKI (acute kidney injury) ([place])   Chronic systolic CHF (congestive heart failure) (HCC)   Acquired thrombophilia (HCC)   Hypokalemia   Proteinuria   Hyponatremia   Metabolic acidosis   Hyperglycemia   Hypoalbuminemia   Leukocytosis   Coagulopathy (HCC) with elevated PT and PTT on admission   Elevated brain natriuretic peptide (BNP) level   Elevated lactic acid level   Bacteremia due to Escherichia coli   Anemia of infection and chronic disease   1st degree AV block   RBBB   Pleural effusion, left   Hypertensive heart and kidney disease with HF and with CKD stage III A (HCC)   Acute on chronic systolic CHF (congestive heart failure) (HCC)   Physical deconditioning   Sepsis due to UTI POA from E. coli Blood cultures remarkable for E.  coli, transition to cefepime for discharge  Ct abdomen and pelvis unremarkable Remains afebrile over the past 72 hours  Acute kidney injury on chronic renal disease stage IIIa Renal function improving. Likely in the setting of sepsis, poor p.o. intake, and dehydration. Tolerating p.o. well, IV fluids stopped Resume home Lasix now that back to baseline Lab Results  Component Value Date   CREATININE 1.29 (H) 01/15/2020   CREATININE 1.26 (H) 01/14/2020   CREATININE 1.25 (H) 01/13/2020   Acute chronic systolic CHF without acute exacerbation Initially hypovolemic given above on admission receiving IV fluids KIA resolved, fluids stopped Previously received IV albumin along with Lasix with appropriate diuresis  Resume oral Lasix, appears much more euvolemic than previous.  Essential hypertension, well controlled Blood pressure well controlled off medications likely in setting of sepsis as above  Only only on Lasix, holding all other hypertensive medications  Hypokalemia Replaced.  Paroxysmal A. fib, rate controlled Currently in sinus rhythm, continue amiodarone.  Continue Eliquis.  Deconditioning, ambulatory dysfunction, protein caloric malnutrition(moderate). Initial plan was to go home with home health based on PT recommendation. PT OT evaluation suggest that the patient should go to SNF. Family also unable to take care of the patient at home. Currently awaiting insurance authorization for SNF.  Diet: Cardiac diet DVT Prophylaxis:  apixaban (ELIQUIS) tablet 5 mg    Advance goals of care discussion: Full code  Family Communication: no family was present at bedside, at the time of interview.  Discussed with wife on phone  Disposition:  Status is: Inpatient  Remains inpatient appropriate because:Unsafe discharge.  Unable to take care of himself and family unable to take care of patient as well.  Dispo: The patient is from: Home              Anticipated d/c is to: SNF               Anticipated d/c date is: 24 to 48 hours              Patient currently is medically stable to d/c.  Currently awaiting bed availability and insurance authorization.  Subjective: No acute issues or events overnight, denies chest pain, headache, fever, chills, nausea, vomiting, diarrhea, constipation.  Hartness of breath markedly improving per patient although still somewhat dyspneic with exertion.  Physical Exam:  General:  Pleasantly resting in bed, No acute distress. HEENT:  Normocephalic atraumatic.  Sclerae nonicteric, noninjected.  Extraocular movements intact bilaterally. Neck:  Without mass or deformity.  Trachea is midline. Lungs:  Clear to auscultate bilaterally without rhonchi, wheeze, or rales. Heart:  Regular rate and rhythm.  Without murmurs, rubs, or gallops. Abdomen:  Soft, nontender, nondistended.  Without guarding or rebound. Extremities: Without cyanosis, clubbing, edema, or obvious deformity. Vascular:  Dorsalis pedis and posterior tibial pulses palpable bilaterally. Skin:  Warm and dry, no erythema, no ulcerations.   Vitals:   01/14/20 1400 01/14/20 2047 01/15/20 0340 01/15/20 0718  BP: 105/60 (!) 110/57 (!) 114/58   Pulse: 72 (!) 59 (!) 59   Resp:  19 20   Temp: 98.2 F (36.8 C) 98.4 F (36.9 C) 98.1 F (36.7 C)   TempSrc: Oral Oral Oral   SpO2:  98% 95% 98%  Weight:      Height:        Intake/Output Summary (Last 24 hours) at 01/15/2020 1501 Last data filed at 01/15/2020 1300 Gross per 24 hour  Intake 1640 ml  Output 2600 ml  Net -960 ml   Filed Weights   01/09/20 1146 01/09/20 1211 01/09/20 2108  Weight: 89.8 kg 89.8 kg 94.4 kg    Data Reviewed: I have personally reviewed and interpreted daily labs, tele strips, imagings as discussed above. I reviewed all nursing notes, pharmacy notes, vitals, pertinent old records I have discussed plan of care as described above with RN and patient/family.  CBC: Recent Labs  Lab 01/09/20 1244  01/09/20 1244 01/10/20 0205 01/11/20 0751 01/12/20 0844 01/13/20 1007 01/14/20 0215  WBC 22.3*   < > 22.5* 16.7* 13.9* 11.0* 11.5*  NEUTROABS 20.5*  --   --  13.3*  --   --  8.9*  HGB 12.0*   < > 10.9* 12.2* 11.5* 10.7* 11.2*  HCT 35.5*   < > 31.8* 36.7* 34.1* 31.9* 33.3*  MCV 95.7   < > 96.1 98.4 96.6 95.8 95.4  PLT 246   < > 264 229 216 248 308   < > = values in this interval not displayed.   Basic Metabolic Panel: Recent Labs  Lab 01/11/20 0751 01/12/20 0844 01/13/20 1007 01/14/20 0215 01/15/20 0323  NA 135 134* 136 137 137  K 3.6 3.5 3.0* 3.8 4.0  CL 105 103 102 102 101  CO2 18* '22 24 25 27  '$ GLUCOSE 93 139* 149* 96 92  BUN 27* '19 16 15 17  '$ CREATININE 1.55* 1.30* 1.25* 1.26* 1.29*  CALCIUM 8.0* 8.0* 7.7* 8.1* 8.1*  MG 2.0  --   --  2.1  --   PHOS  --   --  2.5 3.1 3.6  Studies: No results found.  Scheduled Meds: . amiodarone  100 mg Oral Once per day on Sun Sat  . amiodarone  200 mg Oral Once per day on Mon Tue Wed Thu Fri  . apixaban  5 mg Oral BID  . cefdinir  300 mg Oral Q12H  . docusate sodium  100 mg Oral BID  . furosemide  40 mg Oral Daily  . Gerhardt's butt cream   Topical QID  . guaiFENesin  600 mg Oral BID  . multivitamin with minerals  1 tablet Oral Daily  . tamsulosin  0.4 mg Oral Daily   Continuous Infusions:  PRN Meds: acetaminophen **OR** acetaminophen, levalbuterol, sodium chloride, traMADol  Time spent: 35 minutes  [place] 01/15/2020 3:01 PM  Between 7PM-7AM, please contact night-coverage If you still have difficulty reaching the attending provider, please page the [facility] (Director on Call) for [facility] on amion for assistance.

## 2020-01-15 NOTE — Care Management (Signed)
Received SNF authorization [id number] A July 7 to July 9. SNF to fax clinicals to [phone].   Spoke with Lenna Sciara at [facility], information forwarded to her.   Can admit in morning.   Patient aware and will call son.   Magdalen Spatz RN

## 2020-01-15 NOTE — Plan of Care (Signed)
  Problem: Nutrition: Goal: Adequate nutrition will be maintained Outcome: Progressing   Problem: Elimination: Goal: Will not experience complications related to bowel motility Outcome: Progressing   Problem: Skin Integrity: Goal: Risk for impaired skin integrity will decrease Outcome: Progressing   

## 2020-01-15 NOTE — TOC Progression Note (Addendum)
Transition of Care [facility]) - Progression Note    Patient Details  Name: Bruce Cohen MRN: [id number] Date of Birth: [birth year]  [facility] of [facility]) CM/SW Contact  Jacalyn Lefevre Edson Snowball, RN Phone Number: 01/15/2020, 10:11 AM  Clinical Narrative:     Spoke to patient at bedside . Patient has chosen [facility]. Called and left message for admissions coordinator.  Melissa at [facility] returned call. She ccan accept patient as soon as insurance authorization is received. Patient has had both Covid vaccinations.   [employer] spoke with Lovena Le , updated SNF chose. Per [facility] Josem Kaufmann is still under review .   Expected Discharge Plan: [place]    Expected Discharge Plan and Services Expected Discharge Plan: [place]   Discharge Planning Services: CM Consult Post Acute Care Choice: [place] arrangements for the past 2 months: Single Family Home Expected Discharge Date: 01/14/20                         HH Arranged: PT HH Agency: [facility] Date [place]: 01/13/20 Time [place]: [id number] Representative spoke with at [place]: left message for [facility] awaiting call back   Social Determinants of Health (SDOH) Interventions    Readmission Risk Interventions No flowsheet data found.

## 2020-01-15 NOTE — Plan of Care (Signed)
°  Problem: Education: Goal: Knowledge of General Education information will improve Description: Including pain rating scale, medication(s)/side effects and non-pharmacologic comfort measures Outcome: Progressing   Problem: Elimination: Goal: Will not experience complications related to bowel motility Outcome: Progressing Goal: Will not experience complications related to urinary retention Outcome: Progressing   Problem: Pain Managment: Goal: General experience of comfort will improve Outcome: Progressing   Problem: Safety: Goal: Ability to remain free from injury will improve Outcome: Progressing   

## 2020-01-15 NOTE — Discharge Instructions (Signed)
Bathing / Hygiene: Cleaning Perineal Area    Use a hand-held shower for rinsing directly. To dry off, toweling may be insufficient. Use hair dryer on cool cycle after leaving shower area. Wait 5-10 minutes before dressing.  Copyright  VHI. All rights reserved.   Information on my medicine - ELIQUIS (apixaban)  This medication education was reviewed with me or my healthcare representative as part of my discharge preparation.    Why was Eliquis prescribed for you? Eliquis was prescribed for you to reduce the risk of a blood clot forming that can cause a stroke if you have a medical condition called atrial fibrillation (a type of irregular heartbeat).  What do You need to know about Eliquis ? Take your Eliquis TWICE DAILY - one tablet in the morning and one tablet in the evening with or without food. If you have difficulty swallowing the tablet whole please discuss with your pharmacist how to take the medication safely.  Take Eliquis exactly as prescribed by your doctor and DO NOT stop taking Eliquis without talking to the doctor who prescribed the medication.  Stopping may increase your risk of developing a stroke.  Refill your prescription before you run out.  After discharge, you should have regular check-up appointments with your healthcare provider that is prescribing your Eliquis.  In the future your dose may need to be changed if your kidney function or weight changes by a significant amount or as you get older.  What do you do if you miss a dose? If you miss a dose, take it as soon as you remember on the same day and resume taking twice daily.  Do not take more than one dose of ELIQUIS at the same time to make up a missed dose.  Important Safety Information A possible side effect of Eliquis is bleeding. You should call your healthcare provider right away if you experience any of the following: ? Bleeding from an injury or your nose that does not stop. ? Unusual colored  urine (red or dark brown) or unusual colored stools (red or black). ? Unusual bruising for unknown reasons. ? A serious fall or if you hit your head (even if there is no bleeding).  Some medicines may interact with Eliquis and might increase your risk of bleeding or clotting while on Eliquis. To help avoid this, consult your healthcare provider or pharmacist prior to using any new prescription or non-prescription medications, including herbals, vitamins, non-steroidal anti-inflammatory drugs (NSAIDs) and supplements.  This website has more information on Eliquis (apixaban): http://www.eliquis.com/eliquis/home

## 2020-01-15 NOTE — Assessment & Plan Note (Signed)
Met sepsis on admission with BP 92/51, RR 24, Tmax 100.1, WBC 22K, lactic acidosis, AKI (2 organ dysfunction)

## 2020-01-16 DIAGNOSIS — B9620 Unspecified Escherichia coli [E. coli] as the cause of diseases classified elsewhere: Secondary | ICD-10-CM

## 2020-01-16 DIAGNOSIS — R7881 Bacteremia: Secondary | ICD-10-CM

## 2020-01-16 LAB — RENAL FUNCTION PANEL
Albumin: 2.1 g/dL — ABNORMAL LOW (ref 3.5–5.0)
Anion gap: 10 (ref 5–15)
BUN: 17 mg/dL (ref 8–23)
CO2: 25 mmol/L (ref 22–32)
Calcium: 8.1 mg/dL — ABNORMAL LOW (ref 8.9–10.3)
Chloride: 102 mmol/L (ref 98–111)
Creatinine, Ser: 1.3 mg/dL — ABNORMAL HIGH (ref 0.61–1.24)
GFR calc Af Amer: >60 mL/min (ref >60)
GFR calc non Af Amer: 52 mL/min — ABNORMAL LOW (ref >60)
Glucose, Bld: 147 mg/dL — ABNORMAL HIGH (ref 70–99)
Phosphorus: 3.8 mg/dL (ref 2.5–4.6)
Potassium: 4.1 mmol/L (ref 3.5–5.1)
Sodium: 137 mmol/L (ref 135–145)

## 2020-01-16 LAB — CBC
HCT: 34.5 % — ABNORMAL LOW (ref 39.0–52.0)
Hemoglobin: 11.3 g/dL — ABNORMAL LOW (ref 13.0–17.0)
MCH: 31.7 pg (ref 26.0–34.0)
MCHC: 32.8 g/dL (ref 30.0–36.0)
MCV: 96.9 fL (ref 80.0–100.0)
Platelets: 400 10*3/uL (ref 150–400)
RBC: 3.56 MIL/uL — ABNORMAL LOW (ref 4.22–5.81)
RDW: 13.2 % (ref 11.5–15.5)
WBC: 11.8 10*3/uL — ABNORMAL HIGH (ref 4.0–10.5)
nRBC: 0 % (ref 0.0–0.2)

## 2020-01-16 MED ORDER — CEFDINIR 300 MG PO CAPS: 300.0000 mg | ORAL_CAPSULE | Freq: Two times a day (BID) | ORAL | 0 refills | Status: AC

## 2020-01-16 MED ORDER — TRAMADOL HCL 50 MG PO TABS: 50.0000 mg | ORAL_TABLET | Freq: Four times a day (QID) | ORAL | 0 refills | Status: DC | PRN

## 2020-01-16 NOTE — Progress Notes (Signed)
Reattempted to give report a 3rd time and told Legrand Como at the front desk that no one ever answered the phone when he transferred me for room 306.

## 2020-01-16 NOTE — Progress Notes (Signed)
Reattempted to give report, no answer at the facility.

## 2020-01-16 NOTE — Progress Notes (Signed)
Attempted to give report to nurse for [facility], rang with no answer once transferred to area (room 306) pt will be going to. Will try again soon.

## 2020-01-16 NOTE — TOC Progression Note (Addendum)
Transition of Care [facility]) - Progression Note    Patient Details  Name: Bruce Cohen MRN: [id number] Date of Birth: 1940-12-23  [facility]) CM/SW Contact  Jacalyn Lefevre Edson Snowball, RN Phone Number: 01/16/2020, 9:52 AM  Clinical Narrative:     Discharge summary sent to [facility]. [place] [facility] ready to admit today.   Patient aware .   Number for nurse to call report to is [phone]. Patient going to room 306. Patient aware and will call his son( NCM offered to call son, patient declined).   PTAR called. Paperwork on chart.   Called Zack with [facility] to come get portable oxygen tank that was ordered for home.   Cindie with [facility] also aware.  Expected Discharge Plan: [place]    Expected Discharge Plan and Services Expected Discharge Plan: [place]   Discharge Planning Services: CM Consult Post Acute Care Choice: [place] arrangements for the past 2 months: Single Family Home Expected Discharge Date: 01/16/20                         HH Arranged: PT HH Agency: [place] Date [place]: 01/13/20 Time [place]: 3491 Representative spoke with at [place]: left message for [facility] awaiting call back   Social Determinants of Health (SDOH) Interventions    Readmission Risk Interventions No flowsheet data found.

## 2020-01-16 NOTE — Discharge Summary (Signed)
Physician Discharge Summary  Bruce Cohen GJV:[id number] DOB: [birth year] DOA: 01/09/2020  PCP: Marden Noble, MD  Admit date: 01/09/2020 Discharge date: 01/16/2020  Admitted From: Home Disposition: Skilled nursing facility  Recommendations for Outpatient Follow-up:  1. Follow up with PCP in 1-2 weeks 2. Please obtain BMP/CBC in one week    Discharge Condition: Stable CODE STATUS: Full code Diet recommendation: Low-salt diet  Discharge summary: Bruce Cohen a 79 y.o.malewith medical history significant ofCAD,chronic systolic CHF, cardiomyopathy,paroxysmal A. fib, COPD, hypertension, hyperlipidemia, thoracic aneurysm, presented with dysuria. Symptoms started 3 days ago,with urinary frequency, urine incontinent,lower abdominal discomfort,episodes of chills but no fever, no nauseous vomiting.  Patient was found to have E. coli bacteremia and E. coli UTI and severe physical deconditioning.  Is going to a skilled nursing facility today.  Sepsis due to E. coli UTI present on admission with E. coli bacteremia: Urine cultures with E. coli sensitive to cephalosporin Blood cultures with E. coli with similar sensitivity CT scan abdomen pelvis unremarkable Treated with Rocephin with improvement, changed to oral cephalosporin, will complete total 10 days of therapy. Does have symptoms of prostatism but no retention or no hydronephrosis.  Will add Flomax.  Acute kidney injury on chronic kidney disease stage IIIa: Renal functions improving and normalizing.  Home Lasix was resumed.  Now at baseline.  Chronic systolic congestive heart failure without acute exacerbation: Initially treated with IV fluids.  He is euvolemic.  Lasix resumed.  Essential hypertension: Blood pressure well controlled.  Paroxysmal A. fib: Currently in sinus rhythm.  He is on amiodarone and therapeutic on Eliquis.  Deconditioning and ambulatory dysfunction: Due to all medical issues with profound physical  deconditioning.  He will benefit with inpatient therapies.  Discharge to a skilled rehab.  Occasionally using oxygen with underlying history of COPD and cardiomyopathy.  No evidence of exacerbation.  May need oxygen as needed on ambulation to keep saturations more than 89%.   Discharge Diagnoses:  Active Problems:   Mixed hyperlipidemia   HTN (hypertension)   Patient nonadherence   COPD (chronic obstructive pulmonary disease) (HCC)   History of tobacco abuse   Interstitial lung disease (HCC)   Persistent atrial fibrillation (HCC)   CAD (coronary artery disease)   LVH (left ventricular hypertrophy)   NICM (nonischemic cardiomyopathy) (HCC)   High risk medication use   Thoracic ascending aortic aneurysm (HCC)   Acute lower UTI   Sepsis (HCC) - Criteria met with SIRS + E. Coli bacteremia + AKI and lactic acidosis   AKI (acute kidney injury) (HCC)   Chronic systolic CHF (congestive heart failure) (HCC)   Acquired thrombophilia (HCC)   Hypokalemia   Proteinuria   Hyponatremia   Metabolic acidosis   Hyperglycemia   Hypoalbuminemia   Leukocytosis   Coagulopathy (HCC) with elevated PT and PTT on admission   Elevated brain natriuretic peptide (BNP) level   Elevated lactic acid level   Bacteremia due to Escherichia coli   Anemia of infection and chronic disease   1st degree AV block   RBBB   Pleural effusion, left   Hypertensive heart and kidney disease with HF and with CKD stage III A (HCC)   Acute on chronic systolic CHF (congestive heart failure) (HCC)   Physical deconditioning    Discharge Instructions  Discharge Instructions    Diet - low sodium heart healthy   Complete by: As directed    Discharge instructions   Complete by: As directed    It is important that you  read the given instructions as well as go over your medication list with RN to help you understand your care after this hospitalization.  Please follow-up with PCP in 1-2 weeks.  Please note that NO  REFILLS for any discharge medications will be authorized once you are discharged, as it is imperative that you return to your primary care physician (or establish a relationship with a primary care physician if you do not have one) for your aftercare needs so that they can reassess your need for medications and monitor your lab values.  Please request your primary care physician to go over all Hospital Tests and Procedure/Radiological results at the follow up. Please get all Hospital records sent to your PCP by signing hospital release before you go home.    Do not take more than prescribed Pain, Sleep and Anxiety Medications.  You were cared for by a hospitalist during your hospital stay. If you have any questions about your discharge medications or the care you received while you were in the hospital after you are discharged, you can call the unit _0 @ you were admitted to and ask to speak with the hospitalist Berle Mull. Ask for Hospitalist on call if the hospitalist that took care of you is not available.   Once you are discharged, your primary care physician will handle any further medical issues.  You Must read complete instructions/literature along with all the possible adverse reactions/side effects for all the Medicines you take and that have been prescribed to you. Take any new Medicines after you have completely understood and accept all the possible adverse reactions/side effects.  If you have smoked or chewed Tobacco in the last 2 yrs please STOP smoking If you drink alcohol, please safely reduce the use. Do not drive, operating heavy machinery, perform activities at heights, swimming or participation in water activities or provide baby sitting services under influence.  Wear Seat belts while driving.   Increase activity slowly   Complete by: As directed      Allergies as of 01/16/2020   No Known Allergies     Medication List    STOP taking these medications   amLODipine 5  MG tablet Commonly known as: NORVASC   losartan 25 MG tablet Commonly known as: COZAAR   predniSONE 10 MG tablet Commonly known as: DELTASONE     TAKE these medications   amiodarone 200 MG tablet Commonly known as: PACERONE TAKE 1 TABLET EVERY DAY AND 1/2 TABLET ON SATURDAY AND SUNDAY What changed: See the new instructions.   apixaban 5 MG Tabs tablet Commonly known as: ELIQUIS Take 1 tablet (5 mg total) by mouth 2 (two) times daily.   cefdinir 300 MG capsule Commonly known as: OMNICEF Take 1 capsule (300 mg total) by mouth 2 (two) times daily for 2 days.   cholecalciferol 25 MCG (1000 UNIT) tablet Commonly known as: VITAMIN D3 Take 1,000 Units by mouth daily.   furosemide 40 MG tablet Commonly known as: LASIX Take 1 tablet (40 mg total) by mouth daily.   Gerhardt's butt cream Crea Apply 1 application topically 4 (four) times daily.   guaiFENesin 600 MG 12 hr tablet Commonly known as: MUCINEX Take 1 tablet (600 mg total) by mouth 2 (two) times daily.   multivitamin with minerals Tabs tablet Take 1 tablet by mouth daily.   tamsulosin 0.4 MG Caps capsule Commonly known as: FLOMAX Take 1 capsule (0.4 mg total) by mouth daily.   traMADol 50 MG tablet Commonly known as:  ULTRAM Take 1 tablet (50 mg total) by mouth every 6 (six) hours as needed. What changed: how much to take            Durable Medical Equipment  (From admission, onward)         Start     Ordered   01/13/20 1517  For home use only DME oxygen  Once       Question Answer Comment  Length of Need Lifetime   Mode or (Route) Nasal cannula   Liters per Minute 2   Frequency Continuous (stationary and portable oxygen unit needed)   Oxygen delivery system Gas      01/13/20 1516          Contact information for follow-up providers    [facility], [facility] Follow up.   Specialty: Home Health Services Contact information: [place] [address] [phone]         Josetta Huddle, MD. Schedule an appointment as soon as possible for a visit in 1 week(s).   Specialty: Internal Medicine Why: follow up on scrotal redness Contact information: [address] [place] [address] [phone]            Contact information for after-discharge care    Destination    [facility] .   Service: Skilled Nursing Contact information: [place] [phone]                 No Known Allergies  Consultations:     Procedures/Studies: CT ABDOMEN PELVIS WO CONTRAST  Result Date: 01/11/2020 CLINICAL DATA:  Scrotal swelling EXAM: CT ABDOMEN AND PELVIS WITHOUT CONTRAST TECHNIQUE: Multidetector CT imaging of the abdomen and pelvis was performed following the standard protocol without IV contrast. COMPARISON:  Renal ultrasound dated 01/09/2020 FINDINGS: Lower chest: There is a small left pleural effusion. Chronic reticular/fibrotic changes are seen in the lung bases. Hepatobiliary: No focal liver abnormality is seen. Density layering in the gallbladder may reflect gallstones or gallbladder sludge. No gallbladder wall thickening or biliary dilatation. Pancreas: Unremarkable. No pancreatic ductal dilatation or surrounding inflammatory changes. Spleen: Normal in size without focal abnormality. Adrenals/Urinary Tract: Adrenal glands are unremarkable. Kidneys are normal, without renal calculi, focal lesion, or hydronephrosis. Multiple bladder diverticula are noted. Stomach/Bowel: Stomach is within normal limits. No pericecal inflammatory changes are noted to suggest acute appendicitis. No evidence of bowel wall thickening, distention, or inflammatory changes. Vascular/Lymphatic: Aortic atherosclerosis. No enlarged abdominal or pelvic lymph nodes. Reproductive: Prostate is unremarkable. No large scrotal fluid collection or inflammatory changes are noted given the limitations of this modality. Other: No  abdominal wall hernia or abnormality. No abdominopelvic ascites. Musculoskeletal: Degenerative changes are seen in the spine. IMPRESSION: 1. No large scrotal fluid collection or inflammatory changes are noted given the limitations of this modality. 2. Multiple bladder diverticula. 3. Small left pleural effusion. Aortic Atherosclerosis (ICD10-I70.0). Electronically Signed   By: Zerita Boers M.D.   On: 01/11/2020 10:04   US RENAL  Result Date: 01/09/2020 CLINICAL DATA:  Acute kidney injury EXAM: RENAL / URINARY TRACT ULTRASOUND COMPLETE COMPARISON:  None. FINDINGS: Right Kidney: Renal measurements: 11.2 x 4.7 x 4.1 cm = volume: 112 mL . Echogenicity within normal limits. No mass or hydronephrosis visualized. Left Kidney: Renal measurements: 11.7 x 5.6 x 3.8 cm = volume: 137 mL. Echogenicity within normal limits. No mass or hydronephrosis visualized. Bladder: Appears normal for degree of bladder distention. Other: None.  IMPRESSION: No acute findings.  No hydronephrosis. Electronically Signed   By: Rolm Baptise M.D.   On: 01/09/2020 18:55   DG Chest Port 1 View  Result Date: 01/09/2020 CLINICAL DATA:  Shortness of breath. EXAM: PORTABLE CHEST 1 VIEW COMPARISON:  10/05/2015. FINDINGS: Cardiomegaly. No pulmonary venous congestion. Mild bilateral interstitial prominence noted. A component these changes may be chronic. Active interstitial process including interstitial edema and/or pneumonitis cannot be excluded. No pleural effusion or pneumothorax. IMPRESSION: Cardiomegaly. Mild bilateral interstitial prominence noted. Component these changes may be chronic, an active interstitial process including interstitial edema and/or pneumonitis cannot be excluded. Electronically Signed   By: Marcello Moores  Register   On: 01/09/2020 13:22    (Echo, Carotid, EGD, Colonoscopy, ERCP)    Subjective: Patient seen and examined.  He still has frequent urination but without any fever or chills.  Denies any dysuria.  No abdominal pain.   Ready to go to rehab.   Discharge Exam: Vitals:   01/15/20 2038 01/16/20 0450  BP: 119/66 117/62  Pulse: (!) 58 (!) 57  Resp: 16 16  Temp: 98.8 F (37.1 C) 98.5 F (36.9 C)  SpO2: 100% 98%   Vitals:   01/15/20 0718 01/15/20 1502 01/15/20 2038 01/16/20 0450  BP:  (!) 115/57 119/66 117/62  Pulse:  (!) 59 (!) 58 (!) 57  Resp:  _0 Temp:  99.1 F (37.3 C) 98.8 F (37.1 C) 98.5 F (36.9 C)  TempSrc:  Oral Oral Oral  SpO2: 98% 99% 100% 98%  Weight:      Height:        General: Pt is alert, awake, not in acute distress Cardiovascular: RRR, S1/S2 +, no rubs, no gallops Respiratory: CTA bilaterally, no wheezing, no rhonchi Abdominal: Soft, NT, ND, bowel sounds + Extremities: no edema, no cyanosis    The results of significant diagnostics from this hospitalization (including imaging, microbiology, ancillary and laboratory) are listed below for reference.     Microbiology: Recent Results (from the past 240 hour(s))  Urine culture     Status: Abnormal   Collection Time: 01/09/20 12:34 PM   Specimen: In/Out Cath Urine  Result Value Ref Range Status   Specimen Description IN/OUT CATH URINE  Final   Special Requests   Final    NONE Performed at [facility], [address], [place], [place] [address]    Culture MULTIPLE SPECIES PRESENT, SUGGEST RECOLLECTION (A)  Final   Report Status 01/11/2020 FINAL  Final  SARS Coronavirus 2 by RT PCR (hospital order, performed in [facility] hospital lab) Nasopharyngeal Nasopharyngeal Swab     Status: None   Collection Time: 01/09/20 12:49 PM   Specimen: Nasopharyngeal Swab  Result Value Ref Range Status   SARS Coronavirus 2 NEGATIVE NEGATIVE Final    Comment: (NOTE) SARS-CoV-2 target nucleic acids are NOT DETECTED.  The SARS-CoV-2 RNA is generally detectable in upper and lower respiratory specimens during the acute phase of infection. The lowest concentration of SARS-CoV-2 viral copies this assay can detect is  250 copies / mL. A negative result does not preclude SARS-CoV-2 infection and should not be used as the sole basis for treatment or other patient management decisions.  A negative result may occur with improper specimen collection / handling, submission of specimen other than nasopharyngeal swab, presence of viral mutation(s) within the areas targeted by this assay, and inadequate number of viral copies (<250 copies / mL). A negative result must be combined with clinical observations, patient  history, and epidemiological information.  Fact Sheet for Patients:   StrictlyIdeas.no  Fact Sheet for Healthcare Providers: BankingDealers.co.za  This test is not yet approved or  cleared by the [place] FDA and has been authorized for detection and/or diagnosis of SARS-CoV-2 by FDA under an Emergency Use Authorization (EUA).  This EUA will remain in effect (meaning this test can be used) for the duration of the COVID-19 declaration under Section 564(b)(1) of the Act, 21 U.S.C. section 360bbb-3(b)(1), unless the authorization is terminated or revoked sooner.  Performed at [facility], [place] [address] [place], [place] [address]   Blood Culture (routine x 2)     Status: Abnormal   Collection Time: 01/09/20 12:50 PM   Specimen: BLOOD RIGHT HAND  Result Value Ref Range Status   Specimen Description BLOOD RIGHT HAND  Final   Special Requests   Final    BOTTLES DRAWN AEROBIC AND ANAEROBIC Blood Culture results may not be optimal due to an inadequate volume of blood received in culture bottles   Culture  Setup Time   Final    GRAM NEGATIVE RODS AEROBIC BOTTLE ONLY CRITICAL VALUE NOTED.  VALUE IS CONSISTENT WITH PREVIOUSLY REPORTED AND CALLED VALUE.    Culture (A)  Final    ESCHERICHIA COLI SUSCEPTIBILITIES PERFORMED ON PREVIOUS CULTURE WITHIN THE LAST 5 DAYS. Performed at [facility], [place] [address] [place], [place] [address]     Report Status 01/14/2020 FINAL  Final  Blood Culture (routine x 2)     Status: Abnormal   Collection Time: 01/09/20 12:50 PM   Specimen: BLOOD  Result Value Ref Range Status   Specimen Description BLOOD LEFT ANTECUBITAL  Final   Special Requests   Final    BOTTLES DRAWN AEROBIC AND ANAEROBIC Blood Culture adequate volume   Culture  Setup Time   Final    GRAM NEGATIVE RODS IN BOTH AEROBIC AND ANAEROBIC BOTTLES CRITICAL RESULT CALLED TO, READ BACK BY AND VERIFIED WITH: [facility] EMILY SINCLAIR [phone] Jennette Performed at [facility], [place] [address] [place], [place] [address]    Culture ESCHERICHIA COLI (A)  Final   Report Status 01/12/2020 FINAL  Final   Organism ID, Bacteria ESCHERICHIA COLI  Final      Susceptibility   Escherichia coli - MIC*    AMPICILLIN >=32 RESISTANT Resistant     CEFAZOLIN <=4 SENSITIVE Sensitive     CEFEPIME <=0.12 SENSITIVE Sensitive     CEFTAZIDIME <=1 SENSITIVE Sensitive     CEFTRIAXONE <=0.25 SENSITIVE Sensitive     CIPROFLOXACIN <=0.25 SENSITIVE Sensitive     GENTAMICIN >=16 RESISTANT Resistant     IMIPENEM <=0.25 SENSITIVE Sensitive     TRIMETH/SULFA >=320 RESISTANT Resistant     AMPICILLIN/SULBACTAM >=32 RESISTANT Resistant     PIP/TAZO <=4 SENSITIVE Sensitive     * ESCHERICHIA COLI  Blood Culture ID Panel (Reflexed)     Status: Abnormal   Collection Time: 01/09/20 12:50 PM  Result Value Ref Range Status   Enterococcus species NOT DETECTED NOT DETECTED Final   Listeria monocytogenes NOT DETECTED NOT DETECTED Final   Staphylococcus species NOT DETECTED NOT DETECTED Final   Staphylococcus aureus (BCID) NOT DETECTED NOT DETECTED Final   Streptococcus species NOT DETECTED NOT DETECTED Final   Streptococcus agalactiae NOT DETECTED NOT DETECTED Final   Streptococcus pneumoniae NOT DETECTED NOT DETECTED Final   Streptococcus pyogenes NOT DETECTED NOT DETECTED Final   Acinetobacter baumannii NOT DETECTED NOT DETECTED Final  Enterobacteriaceae  species DETECTED (A) NOT DETECTED Final    Comment: Enterobacteriaceae represent a large family of gram-negative bacteria, not a single organism. CRITICAL RESULT CALLED TO, READ BACK BY AND VERIFIED WITH: [facility] EMILY SINCLAIR [phone] FCP    Enterobacter cloacae complex NOT DETECTED NOT DETECTED Final   Escherichia coli DETECTED (A) NOT DETECTED Final    Comment: CRITICAL RESULT CALLED TO, READ BACK BY AND VERIFIED WITH: [facility] EMILY SINCLAIR [phone] FCP    Klebsiella oxytoca NOT DETECTED NOT DETECTED Final   Klebsiella pneumoniae NOT DETECTED NOT DETECTED Final   Proteus species NOT DETECTED NOT DETECTED Final   Serratia marcescens NOT DETECTED NOT DETECTED Final   Carbapenem resistance NOT DETECTED NOT DETECTED Final   Haemophilus influenzae NOT DETECTED NOT DETECTED Final   Neisseria meningitidis NOT DETECTED NOT DETECTED Final   Pseudomonas aeruginosa NOT DETECTED NOT DETECTED Final   Candida albicans NOT DETECTED NOT DETECTED Final   Candida glabrata NOT DETECTED NOT DETECTED Final   Candida krusei NOT DETECTED NOT DETECTED Final   Candida parapsilosis NOT DETECTED NOT DETECTED Final   Candida tropicalis NOT DETECTED NOT DETECTED Final    Comment: Performed at [facility], [place] [address] [place], [place] [address]  SARS CORONAVIRUS 2 (TAT 6-24 HRS) Nasopharyngeal Nasopharyngeal Swab     Status: None   Collection Time: 01/14/20  2:21 PM   Specimen: Nasopharyngeal Swab  Result Value Ref Range Status   SARS Coronavirus 2 NEGATIVE NEGATIVE Final    Comment: (NOTE) SARS-CoV-2 target nucleic acids are NOT DETECTED.  The SARS-CoV-2 RNA is generally detectable in upper and lower respiratory specimens during the acute phase of infection. Negative results do not preclude SARS-CoV-2 infection, do not rule out co-infections with other pathogens, and should not be used as the sole basis for treatment or other patient management decisions. Negative results must be combined  with clinical observations, patient history, and epidemiological information. The expected result is Negative.  Fact Sheet for Patients: SugarRoll.be  Fact Sheet for Healthcare Providers: https://www.woods-mathews.com/  This test is not yet approved or cleared by the [place] FDA and  has been authorized for detection and/or diagnosis of SARS-CoV-2 by FDA under an Emergency Use Authorization (EUA). This EUA will remain  in effect (meaning this test can be used) for the duration of the COVID-19 declaration under Se ction 564(b)(1) of the Act, 21 U.S.C. section 360bbb-3(b)(1), unless the authorization is terminated or revoked sooner.  Performed at [facility], [place] [address], [place], [place] [address]      Labs: BNP (last 3 results) Recent Labs    01/09/20 1244  BNP 947.0*   Basic Metabolic Panel: Recent Labs  Lab 01/11/20 0751 01/12/20 0844 01/13/20 1007 01/14/20 0215 01/15/20 0323  NA 135 134* 136 137 137  K 3.6 3.5 3.0* 3.8 4.0  CL 105 103 102 102 101  CO2 18* _0 GLUCOSE 93 139* 149* 96 92  BUN 27* _1 CREATININE 1.55* 1.30* 1.25* 1.26* 1.29*  CALCIUM 8.0* 8.0* 7.7* 8.1* 8.1*  MG 2.0  --   --  2.1  --   PHOS  --   --  2.5 3.1 3.6   Liver Function Tests: Recent Labs  Lab 01/09/20 1244 01/09/20 1244 01/11/20 0751 01/12/20 0844 01/13/20 1007 01/14/20 0215 01/15/20 0323  AST 23  --  23 17  --   --   --   ALT 21  --  31 22  --   --   --   ALKPHOS 111  --  104 92  --   --   --   BILITOT 0.7  --  0.8 0.5  --   --   --   PROT 6.4*  --  5.6* 5.7*  --   --   --   ALBUMIN 2.6*   < > 2.2* 2.1* 1.9* 2.0* 2.0*   < > = values in this interval not displayed.   No results for input(s): LIPASE, AMYLASE in the last 168 hours. No results for input(s): AMMONIA in the last 168 hours. CBC: Recent Labs  Lab 01/09/20 1244 01/09/20 1244 01/10/20 0205 01/11/20 0751 01/12/20 0844 01/13/20 1007  01/14/20 0215  WBC 22.3*   < > 22.5* 16.7* 13.9* 11.0* 11.5*  NEUTROABS 20.5*  --   --  13.3*  --   --  8.9*  HGB 12.0*   < > 10.9* 12.2* 11.5* 10.7* 11.2*  HCT 35.5*   < > 31.8* 36.7* 34.1* 31.9* 33.3*  MCV 95.7   < > 96.1 98.4 96.6 95.8 95.4  PLT 246   < > 264 229 216 248 308   < > = values in this interval not displayed.   Cardiac Enzymes: No results for input(s): CKTOTAL, CKMB, CKMBINDEX, TROPONINI in the last 168 hours. BNP: Invalid input(s): POCBNP CBG: No results for input(s): GLUCAP in the last 168 hours. D-Dimer No results for input(s): DDIMER in the last 72 hours. Hgb A1c No results for input(s): HGBA1C in the last 72 hours. Lipid Profile No results for input(s): CHOL, HDL, LDLCALC, TRIG, CHOLHDL, LDLDIRECT in the last 72 hours. Thyroid function studies No results for input(s): TSH, T4TOTAL, T3FREE, THYROIDAB in the last 72 hours.  Invalid input(s): FREET3 Anemia work up No results for input(s): VITAMINB12, FOLATE, FERRITIN, TIBC, IRON, RETICCTPCT in the last 72 hours. Urinalysis    Component Value Date/Time   COLORURINE YELLOW 01/09/2020 1234   APPEARANCEUR CLOUDY (A) 01/09/2020 1234   LABSPEC 1.016 01/09/2020 1234   PHURINE 5.0 01/09/2020 1234   GLUCOSEU NEGATIVE 01/09/2020 1234   HGBUR SMALL (A) 01/09/2020 1234   BILIRUBINUR NEGATIVE 01/09/2020 1234   KETONESUR NEGATIVE 01/09/2020 1234   PROTEINUR 30 (A) 01/09/2020 1234   NITRITE POSITIVE (A) 01/09/2020 1234   LEUKOCYTESUR LARGE (A) 01/09/2020 1234   Sepsis Labs Invalid input(s): PROCALCITONIN,  WBC,  LACTICIDVEN Microbiology Recent Results (from the past 240 hour(s))  Urine culture     Status: Abnormal   Collection Time: 01/09/20 12:34 PM   Specimen: In/Out Cath Urine  Result Value Ref Range Status   Specimen Description IN/OUT CATH URINE  Final   Special Requests   Final    NONE Performed at [facility], [place] [address], [place],  [address]    Culture MULTIPLE SPECIES PRESENT,  SUGGEST RECOLLECTION (A)  Final   Report Status 01/11/2020 FINAL  Final  SARS Coronavirus 2 by RT PCR (hospital order, performed in [facility] hospital lab) Nasopharyngeal Nasopharyngeal Swab     Status: None   Collection Time: 01/09/20 12:49 PM   Specimen: Nasopharyngeal Swab  Result Value Ref Range Status   SARS Coronavirus 2 NEGATIVE NEGATIVE Final    Comment: (NOTE) SARS-CoV-2 target nucleic acids are NOT DETECTED.  The SARS-CoV-2 RNA is generally detectable in upper and lower respiratory specimens during the acute phase of infection. The lowest concentration of SARS-CoV-2 viral copies this assay can detect is 250 copies /  mL. A negative result does not preclude SARS-CoV-2 infection and should not be used as the sole basis for treatment or other patient management decisions.  A negative result may occur with improper specimen collection / handling, submission of specimen other than nasopharyngeal swab, presence of viral mutation(s) within the areas targeted by this assay, and inadequate number of viral copies (<250 copies / mL). A negative result must be combined with clinical observations, patient history, and epidemiological information.  Fact Sheet for Patients:   StrictlyIdeas.no  Fact Sheet for Healthcare Providers: BankingDealers.co.za  This test is not yet approved or  cleared by the [place] FDA and has been authorized for detection and/or diagnosis of SARS-CoV-2 by FDA under an Emergency Use Authorization (EUA).  This EUA will remain in effect (meaning this test can be used) for the duration of the COVID-19 declaration under Section 564(b)(1) of the Act, 21 U.S.C. section 360bbb-3(b)(1), unless the authorization is terminated or revoked sooner.  Performed at [facility], [place] [address] [place], [place] [address]   Blood Culture (routine x 2)     Status: Abnormal   Collection Time: 01/09/20 12:50 PM    Specimen: BLOOD RIGHT HAND  Result Value Ref Range Status   Specimen Description BLOOD RIGHT HAND  Final   Special Requests   Final    BOTTLES DRAWN AEROBIC AND ANAEROBIC Blood Culture results may not be optimal due to an inadequate volume of blood received in culture bottles   Culture  Setup Time   Final    GRAM NEGATIVE RODS AEROBIC BOTTLE ONLY CRITICAL VALUE NOTED.  VALUE IS CONSISTENT WITH PREVIOUSLY REPORTED AND CALLED VALUE.    Culture (A)  Final    ESCHERICHIA COLI SUSCEPTIBILITIES PERFORMED ON PREVIOUS CULTURE WITHIN THE LAST 5 DAYS. Performed at [facility], [place] [address] [place], [place] [address]    Report Status 01/14/2020 FINAL  Final  Blood Culture (routine x 2)     Status: Abnormal   Collection Time: 01/09/20 12:50 PM   Specimen: BLOOD  Result Value Ref Range Status   Specimen Description BLOOD LEFT ANTECUBITAL  Final   Special Requests   Final    BOTTLES DRAWN AEROBIC AND ANAEROBIC Blood Culture adequate volume   Culture  Setup Time   Final    GRAM NEGATIVE RODS IN BOTH AEROBIC AND ANAEROBIC BOTTLES CRITICAL RESULT CALLED TO, READ BACK BY AND VERIFIED WITH: [facility] EMILY SINCLAIR [id number] [id number] [place] [facility] Performed at [facility], [place] [address], [place],  [address]    Culture ESCHERICHIA COLI (A)  Final   Report Status 01/12/2020 FINAL  Final   Organism ID, Bacteria ESCHERICHIA COLI  Final      Susceptibility   Escherichia coli - MIC*    AMPICILLIN >=32 RESISTANT Resistant     CEFAZOLIN <=4 SENSITIVE Sensitive     CEFEPIME <=0.12 SENSITIVE Sensitive     CEFTAZIDIME <=1 SENSITIVE Sensitive     CEFTRIAXONE <=0.25 SENSITIVE Sensitive     CIPROFLOXACIN <=0.25 SENSITIVE Sensitive     GENTAMICIN >=16 RESISTANT Resistant     IMIPENEM <=0.25 SENSITIVE Sensitive     TRIMETH/SULFA >=320 RESISTANT Resistant     AMPICILLIN/SULBACTAM >=32 RESISTANT Resistant     PIP/TAZO <=4 SENSITIVE Sensitive     * ESCHERICHIA COLI  Blood Culture ID Panel (Reflexed)      Status: Abnormal   Collection Time: 01/09/20 12:50 PM  Result Value Ref Range Status   Enterococcus species NOT DETECTED  NOT DETECTED Final   Listeria monocytogenes NOT DETECTED NOT DETECTED Final   Staphylococcus species NOT DETECTED NOT DETECTED Final   Staphylococcus aureus (BCID) NOT DETECTED NOT DETECTED Final   Streptococcus species NOT DETECTED NOT DETECTED Final   Streptococcus agalactiae NOT DETECTED NOT DETECTED Final   Streptococcus pneumoniae NOT DETECTED NOT DETECTED Final   Streptococcus pyogenes NOT DETECTED NOT DETECTED Final   Acinetobacter baumannii NOT DETECTED NOT DETECTED Final   Enterobacteriaceae species DETECTED (A) NOT DETECTED Final    Comment: Enterobacteriaceae represent a large family of gram-negative bacteria, not a single organism. CRITICAL RESULT CALLED TO, READ BACK BY AND VERIFIED WITH: PHARMD EMILY SINCLAIR [phone] [id number] [facility]    Enterobacter cloacae complex NOT DETECTED NOT DETECTED Final   Escherichia coli DETECTED (A) NOT DETECTED Final    Comment: CRITICAL RESULT CALLED TO, READ BACK BY AND VERIFIED WITH: PHARMD EMILY SINCLAIR [id number] [id number] [facility]    Klebsiella oxytoca NOT DETECTED NOT DETECTED Final   Klebsiella pneumoniae NOT DETECTED NOT DETECTED Final   Proteus species NOT DETECTED NOT DETECTED Final   Serratia marcescens NOT DETECTED NOT DETECTED Final   Carbapenem resistance NOT DETECTED NOT DETECTED Final   Haemophilus influenzae NOT DETECTED NOT DETECTED Final   Neisseria meningitidis NOT DETECTED NOT DETECTED Final   Pseudomonas aeruginosa NOT DETECTED NOT DETECTED Final   Candida albicans NOT DETECTED NOT DETECTED Final   Candida glabrata NOT DETECTED NOT DETECTED Final   Candida krusei NOT DETECTED NOT DETECTED Final   Candida parapsilosis NOT DETECTED NOT DETECTED Final   Candida tropicalis NOT DETECTED NOT DETECTED Final    Comment: Performed at [facility], [place] [address], [place], [place] [address]  SARS CORONAVIRUS 2 (TAT 6-24  HRS) Nasopharyngeal Nasopharyngeal Swab     Status: None   Collection Time: 01/14/20  2:21 PM   Specimen: Nasopharyngeal Swab  Result Value Ref Range Status   SARS Coronavirus 2 NEGATIVE NEGATIVE Final    Comment: (NOTE) SARS-CoV-2 target nucleic acids are NOT DETECTED.  The SARS-CoV-2 RNA is generally detectable in upper and lower respiratory specimens during the acute phase of infection. Negative results do not preclude SARS-CoV-2 infection, do not rule out co-infections with other pathogens, and should not be used as the sole basis for treatment or other patient management decisions. Negative results must be combined with clinical observations, patient history, and epidemiological information. The expected result is Negative.  Fact Sheet for Patients: SugarRoll.be  Fact Sheet for Healthcare Providers: https://www.woods-mathews.com/  This test is not yet approved or cleared by the [place] FDA and  has been authorized for detection and/or diagnosis of SARS-CoV-2 by FDA under an Emergency Use Authorization (EUA). This EUA will remain  in effect (meaning this test can be used) for the duration of the COVID-19 declaration under Se ction 564(b)(1) of the Act, 21 U.S.C. section 360bbb-3(b)(1), unless the authorization is terminated or revoked sooner.  Performed at [facility], [place] [address] [place], [place] [address]      Time coordinating discharge: 35 minutes  SIGNED:   Barb Merino, MD  [facility] 01/16/2020, 8:08 AM

## 2020-01-16 NOTE — Progress Notes (Signed)
Pt discharged with PTAR to [facility] with O2 PRN if needed, tubing sent.  All 1000 meds given to pt prior to leaving and marked on AVS which was sent with pt.  Will reattempt report now.

## 2020-07-02 ENCOUNTER — Other Ambulatory Visit: Payer: Self-pay | Admitting: Interventional Cardiology

## 2020-07-14 ENCOUNTER — Other Ambulatory Visit: Payer: Self-pay | Admitting: Interventional Cardiology

## 2020-07-16 ENCOUNTER — Telehealth: Payer: Self-pay | Admitting: Interventional Cardiology

## 2020-07-16 NOTE — Telephone Encounter (Signed)
Patient called and stated that Dr. Eldridge Dace wanted him off of metoprolol while the other doctor want him to stay on the medication.  Please call to consult.

## 2020-07-16 NOTE — Telephone Encounter (Signed)
Called and spoke to patient's wife. She states that Dr. Kevan Ny advised the patient to take his meds as prescribed until seen by Dr. Eldridge Dace. No complaints at this time. He has been scheduled for 1/20 and they will let us know if he develops any issues prior to that time.

## 2020-07-30 ENCOUNTER — Ambulatory Visit: Payer: Medicare Other | Admitting: Interventional Cardiology

## 2020-09-21 ENCOUNTER — Ambulatory Visit: Payer: Medicare Other | Admitting: Interventional Cardiology

## 2020-10-21 NOTE — Progress Notes (Signed)
Cardiology Office Note   Date:  10/22/2020   ID:  Bruce Cohen, DOB 1940-07-20, MRN [id number]  PCP:  Josetta Huddle, MD    No chief complaint on file.  AFib  Wt Readings from Last 3 Encounters:  10/22/20 196 lb (88.9 kg)  01/09/20 208 lb 1.8 oz (94.4 kg)  10/31/19 196 lb 3 oz (89 kg)       History of Present Illness: Bruce Cohen is a 80 y.o. 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  He was admitted in3/2016 with acute systolic HF in the setting of AFib with RVR. EF was30-35%. LHC demonstrated mild non-obstructive CAD. NICM was thought to likely be due to tachycardia. There was also concern for ETOH abuse. Heunderwent DCCV 5/13 and again in 4/17. A repeat echocardiogram 12/18/14 demonstrated improved function with an EF of 50%.In7/17, he developedrecurrent AFib andhe was startedon Amiodarone. He converted to NSR.Baseline PFTs did demonstrate mod reduced DLCO and I had him see Dr. Lamonte Sakai. High-resolution CT did demonstrate interlobular septal thickening and subpleural reticulation suggestive of residual edema versus interstitial lung disease, 4 cm ascending thoracic aortic aneurysm. Follow-up CT in one year recommended.  Sx of AFib is SHOB, not palpitations.   He got COVID vaccines.   Had urosepsis in July 2021.    Past Medical History:  Diagnosis Date  . CAD (coronary artery disease)    a. LHC 3/16: pLAD 20, mLAD 20, pRCA 20, EF 25%  . CHF (congestive heart failure) (HCC)    hx of acute systolic hf- 3532  . Dysrhythmia    afib   . Heart murmur    since birth   . History of PFTs    a. PFTs 8/17: FEV1 88% predicted; FEV1/FVC 81%, corr DLCO 57%  . Hyperlipidemia   . Hypertension   . LVH (left ventricular hypertrophy)   . Mitral regurgitation   . NICM (nonischemic cardiomyopathy) ([facility])    Tachy mediated 2/2 AF with RVR  >> a. Echo 3/16: EF 30-35% >> b. Echo 6/16:  EF 50% with mild apical HK, mild LVH, Gr 2 DD, mild MR, severe BAE, PASP 43 mmHg  . OSA (obstructive sleep apnea) 02/12/2015   Mild to moderate with AHI 14.7/hr no cpap or oxygen   . PAF (paroxysmal atrial fibrillation) ([place]) 09/2014   CHADS2-VASc=5 >> Eliquis // 6/17: Amiodarone started  . Pericarditis    a. Hospitalized in [place]. Episode in 1998. Had a cath which was clean (also in 1990s). 2 further episodes that were mild.  Marland Kitchen PONV (postoperative nausea and vomiting)   . Thoracic ascending aortic aneurysm ([place]) 04/26/2016   High res CT 9/17: 4.0 cm ascending thoracic aortic aneurysm // Chest CTA 9/18: ascending aorta 3.9 cm, CAD, benign thyroid nodules, stable lung reticular changes, aortic atherosclerosis    Past Surgical History:  Procedure Laterality Date  . APPENDECTOMY    . BACK SURGERY    . CARDIAC CATHETERIZATION  1998   "clean"  . CARDIOVERSION N/A 11/21/2014   Procedure: CARDIOVERSION;  Surgeon: Fay Records, MD;  Location: [facility] ENDOSCOPY;  Service: Cardiovascular;  Laterality: N/A;  . CARDIOVERSION N/A 10/22/2015   Procedure: CARDIOVERSION;  Surgeon: Herminio Commons, MD;  Location: [facility];  Service: Cardiovascular;  Laterality: N/A;  . FRACTURE SURGERY    . LEFT HEART CATHETERIZATION WITH CORONARY ANGIOGRAM N/A 10/01/2014   Procedure: LEFT HEART CATHETERIZATION WITH CORONARY ANGIOGRAM;  Surgeon: Wellington Hampshire, MD;  Location: [facility] CATH LAB;  Service: Cardiovascular;  Laterality: N/A;  . LESION REMOVAL Left 10/31/2019   Procedure: EXCISION SKIN NEOPLASM LEFT UPPER CHEST WALL, 9X3 CM WITH LAYERED CLOSURE;  Surgeon: Armandina Gemma, MD;  Location: [facility] ORS;  Service: General;  Laterality: Left;  . LUMBAR LAMINECTOMY  1970's   "partial"  . ORIF FINGER / THUMB FRACTURE Left ~ 2006   "thumb"  . TONSILLECTOMY       Current Outpatient Medications  Medication Sig Dispense Refill  . amiodarone (PACERONE) 200 MG tablet TAKE 1 TABLET  EVERY DAY AND 1/2 TABLET ON SATURDAY AND SUNDAY (Patient taking differently: Take 100-200 mg by mouth See admin instructions.) 90 tablet 3  . apixaban (ELIQUIS) 5 MG TABS tablet Take 1 tablet (5 mg total) by mouth 2 (two) times daily. 60 tablet 11  . cholecalciferol (VITAMIN D3) 25 MCG (1000 UNIT) tablet Take 1,000 Units by mouth daily.    . furosemide (LASIX) 40 MG tablet Take 1 tablet (40 mg total) by mouth daily. 90 tablet 3  . Multiple Vitamin (MULTIVITAMIN WITH MINERALS) TABS tablet Take 1 tablet by mouth daily.    Marland Kitchen Nystatin (GERHARDT'S BUTT CREAM) CREA Apply 1 application topically 4 (four) times daily. 1 each 0  . tamsulosin (FLOMAX) 0.4 MG CAPS capsule Take 1 capsule (0.4 mg total) by mouth daily. 30 capsule 0  . atorvastatin (LIPITOR) 40 MG tablet Take 1 tablet by mouth at bedtime.    Marland Kitchen losartan (COZAAR) 25 MG tablet Take 2 tablets by mouth daily.     No current facility-administered medications for this visit.    Allergies:   Patient has no known allergies.    Social History:  The patient  reports that he quit smoking about 22 years ago. His smoking use included cigarettes. He has a 15.00 pack-year smoking history. He has never used smokeless tobacco. He reports current alcohol use. He reports that he does not use drugs.   Family History:  The patient's family history includes Heart disease in his mother; Heart failure in his father; Stroke in his mother.    ROS:  Please see the history of present illness.   Otherwise, review of systems are positive for confusion about amlodipine.   All other systems are reviewed and negative.    PHYSICAL EXAM: VS:  BP 114/70   Pulse (!) 59   Ht 6\' 3"  (1.905 m)   Wt 196 lb (88.9 kg)   SpO2 97%   BMI 24.50 kg/m  , BMI Body mass index is 24.5 kg/m. GEN: Well nourished, well developed, in no acute distress  HEENT: normal  Neck: no JVD, carotid bruits, or masses Cardiac: RRR; no murmurs, rubs, or gallops,no edema  Respiratory:  clear to  auscultation bilaterally, normal work of breathing GI: soft, nontender, nondistended, + BS MS: no deformity or atrophy  Skin: warm and dry, no rash Neuro:  Strength and sensation are intact Psych: euthymic mood, full affect   EKG:   The ekg ordered today demonstrates NSR, no ST changes   Recent Labs: 01/09/2020: B Natriuretic Peptide 869.9 01/12/2020: ALT 22 01/14/2020: Magnesium 2.1 01/16/2020: BUN 17; Creatinine, Ser 1.30; Hemoglobin 11.3; Platelets 400; Potassium 4.1; Sodium 137   Lipid Panel    Component Value Date/Time   CHOL 122 01/14/2015 1147   TRIG 92.0 01/14/2015 1147   HDL 43.90 01/14/2015 1147   CHOLHDL 3 01/14/2015 1147   VLDL 18.4 01/14/2015 1147   LDLCALC 60  01/14/2015 1147     Other studies Reviewed: Additional studies/ records that were reviewed today with results demonstrating: 2016 echo reviewed; labs reviewed.   ASSESSMENT AND PLAN:  1. Persistent AFib: In NSR, Continue Amio. Check CXR annually and TSH, LFTs every 6 months.  2. NICM: Appears euvolemic.  3. CAD: No angina.  COntinue aggressive secondary prevention.  LDL target < 100.  WOuld increase atorvastatin if LDL is high at the next check.  Getting blood work with Dr. Inda Merlin in a few weeks.  4. Thoracic aortic aneurysm: upper limit of normal in 2018.   5. HTN: No need to take amlodipine. COntinue losartan.  BP controlled.  Increase exercise to help build stamina.  6. Chronic pericarditis: Has used prednisone on different occasions.  Check TSH, LFTs given that he is on Amiodarone.  Rx given, to be done with PMD.   Current medicines are reviewed at length with the patient today.  The patient concerns regarding his medicines were addressed.  The following changes have been made:  No change  Labs/ tests ordered today include:  No orders of the defined types were placed in this encounter.   Recommend 150 minutes/week of aerobic exercise Low fat, low carb, high fiber diet recommended  Disposition:   FU  in 1 year   Signed, Larae Grooms, MD  10/22/2020 3:15 PM    [facility] [place], [place], [place]  [address] Phone: [phone]; Fax: [phone]

## 2020-10-22 ENCOUNTER — Encounter: Payer: Self-pay | Admitting: Interventional Cardiology

## 2020-10-22 ENCOUNTER — Ambulatory Visit: Payer: Medicare Other | Admitting: Interventional Cardiology

## 2020-10-22 ENCOUNTER — Other Ambulatory Visit: Payer: Self-pay

## 2020-10-22 VITALS — BP 114/70 | HR 59 | Ht 75.0 in | Wt 196.0 lb

## 2020-10-22 DIAGNOSIS — Z79899 Other long term (current) drug therapy: Secondary | ICD-10-CM

## 2020-10-22 DIAGNOSIS — I428 Other cardiomyopathies: Secondary | ICD-10-CM | POA: Diagnosis not present

## 2020-10-22 DIAGNOSIS — I712 Thoracic aortic aneurysm, without rupture, unspecified: Secondary | ICD-10-CM

## 2020-10-22 DIAGNOSIS — I2510 Atherosclerotic heart disease of native coronary artery without angina pectoris: Secondary | ICD-10-CM | POA: Diagnosis not present

## 2020-10-22 DIAGNOSIS — I4819 Other persistent atrial fibrillation: Secondary | ICD-10-CM | POA: Diagnosis not present

## 2020-10-22 DIAGNOSIS — I10 Essential (primary) hypertension: Secondary | ICD-10-CM

## 2020-10-22 DIAGNOSIS — I319 Disease of pericardium, unspecified: Secondary | ICD-10-CM

## 2020-10-22 NOTE — Patient Instructions (Signed)
Medication Instructions:  Your physician recommends that you continue on your current medications as directed. Please refer to the Current Medication list given to you today.  *If you need a refill on your cardiac medications before your next appointment, please call your pharmacy*   Lab Work: Have liver function tests and TSH checked at your primary care provider's office.  You have a prescription for this If you have labs (blood work) drawn today and your tests are completely normal, you will receive your results only by: Marland Kitchen MyChart Message (if you have MyChart) OR . A paper copy in the mail If you have any lab test that is abnormal or we need to change your treatment, we will call you to review the results.   Testing/Procedures: Have a chest X-ray done in July at [place] in the [place]: At [facility], you and your health needs are our priority.  As part of our continuing mission to provide you with exceptional heart care, we have created designated Provider Care Teams.  These Care Teams include your primary Cardiologist (physician) and Advanced Practice Providers (APPs -  Physician Assistants and Nurse Practitioners) who all work together to provide you with the care you need, when you need it.  We recommend signing up for the patient portal called "MyChart".  Sign up information is provided on this After Visit Summary.  MyChart is used to connect with patients for Virtual Visits (Telemedicine).  Patients are able to view lab/test results, encounter notes, upcoming appointments, etc.  Non-urgent messages can be sent to your provider as well.   To learn more about what you can do with MyChart, go to [email or web address].    Your next appointment:   12 month(s)  The format for your next appointment:   In Person  Provider:   You may see Larae Grooms, MD or one of the following Advanced Practice Providers on your designated Care  Team:    Melina Copa, PA-C  Ermalinda Barrios, PA-C    Other Instructions

## 2020-11-13 ENCOUNTER — Other Ambulatory Visit: Payer: Self-pay | Admitting: Interventional Cardiology

## 2020-12-12 ENCOUNTER — Other Ambulatory Visit: Payer: Self-pay | Admitting: Interventional Cardiology

## 2021-04-22 ENCOUNTER — Other Ambulatory Visit: Payer: Self-pay

## 2021-04-22 ENCOUNTER — Observation Stay (HOSPITAL_COMMUNITY): Payer: Medicare Other

## 2021-04-22 ENCOUNTER — Encounter (HOSPITAL_COMMUNITY): Payer: Self-pay | Admitting: Emergency Medicine

## 2021-04-22 ENCOUNTER — Emergency Department (HOSPITAL_COMMUNITY): Payer: Medicare Other

## 2021-04-22 ENCOUNTER — Observation Stay (HOSPITAL_COMMUNITY)
Admission: EM | Admit: 2021-04-22 | Discharge: 2021-04-24 | Disposition: A | Discharge status: Home / Self Care | Payer: Medicare Other | Attending: Internal Medicine | Admitting: Internal Medicine

## 2021-04-22 DIAGNOSIS — Z79899 Other long term (current) drug therapy: Secondary | ICD-10-CM | POA: Insufficient documentation

## 2021-04-22 DIAGNOSIS — R0602 Shortness of breath: Secondary | ICD-10-CM | POA: Insufficient documentation

## 2021-04-22 DIAGNOSIS — I2510 Atherosclerotic heart disease of native coronary artery without angina pectoris: Secondary | ICD-10-CM | POA: Diagnosis not present

## 2021-04-22 DIAGNOSIS — I5042 Chronic combined systolic (congestive) and diastolic (congestive) heart failure: Secondary | ICD-10-CM | POA: Diagnosis not present

## 2021-04-22 DIAGNOSIS — J8410 Pulmonary fibrosis, unspecified: Secondary | ICD-10-CM | POA: Insufficient documentation

## 2021-04-22 DIAGNOSIS — Z7901 Long term (current) use of anticoagulants: Secondary | ICD-10-CM | POA: Insufficient documentation

## 2021-04-22 DIAGNOSIS — U071 COVID-19: Principal | ICD-10-CM | POA: Insufficient documentation

## 2021-04-22 DIAGNOSIS — R531 Weakness: Secondary | ICD-10-CM

## 2021-04-22 DIAGNOSIS — N1831 Chronic kidney disease, stage 3a: Secondary | ICD-10-CM | POA: Diagnosis not present

## 2021-04-22 DIAGNOSIS — J9601 Acute respiratory failure with hypoxia: Secondary | ICD-10-CM | POA: Diagnosis not present

## 2021-04-22 DIAGNOSIS — I480 Paroxysmal atrial fibrillation: Secondary | ICD-10-CM | POA: Diagnosis not present

## 2021-04-22 DIAGNOSIS — I4819 Other persistent atrial fibrillation: Secondary | ICD-10-CM

## 2021-04-22 DIAGNOSIS — R001 Bradycardia, unspecified: Secondary | ICD-10-CM

## 2021-04-22 DIAGNOSIS — I130 Hypertensive heart and chronic kidney disease with heart failure and stage 1 through stage 4 chronic kidney disease, or unspecified chronic kidney disease: Secondary | ICD-10-CM | POA: Diagnosis not present

## 2021-04-22 DIAGNOSIS — I517 Cardiomegaly: Secondary | ICD-10-CM | POA: Diagnosis not present

## 2021-04-22 DIAGNOSIS — N179 Acute kidney failure, unspecified: Secondary | ICD-10-CM | POA: Insufficient documentation

## 2021-04-22 DIAGNOSIS — Z87891 Personal history of nicotine dependence: Secondary | ICD-10-CM | POA: Insufficient documentation

## 2021-04-22 LAB — URINALYSIS, ROUTINE W REFLEX MICROSCOPIC
Bilirubin Urine: NEGATIVE
Glucose, UA: NEGATIVE mg/dL
Hgb urine dipstick: NEGATIVE
Ketones, ur: NEGATIVE mg/dL
Leukocytes,Ua: NEGATIVE
Nitrite: NEGATIVE
Protein, ur: NEGATIVE mg/dL
Specific Gravity, Urine: 1.017 (ref 1.005–1.030)
pH: 5 (ref 5.0–8.0)

## 2021-04-22 LAB — CBC
HCT: 36.4 % — ABNORMAL LOW (ref 39.0–52.0)
Hemoglobin: 12 g/dL — ABNORMAL LOW (ref 13.0–17.0)
MCH: 32.4 pg (ref 26.0–34.0)
MCHC: 33 g/dL (ref 30.0–36.0)
MCV: 98.4 fL (ref 80.0–100.0)
Platelets: 504 10*3/uL — ABNORMAL HIGH (ref 150–400)
RBC: 3.7 MIL/uL — ABNORMAL LOW (ref 4.22–5.81)
RDW: 13.7 % (ref 11.5–15.5)
WBC: 13.9 10*3/uL — ABNORMAL HIGH (ref 4.0–10.5)
nRBC: 0 % (ref 0.0–0.2)

## 2021-04-22 LAB — RESP PANEL BY RT-PCR (FLU A&B, COVID) ARPGX2
Influenza A by PCR: NEGATIVE
Influenza B by PCR: NEGATIVE
SARS Coronavirus 2 by RT PCR: POSITIVE — AB

## 2021-04-22 LAB — BASIC METABOLIC PANEL
Anion gap: 9 (ref 5–15)
BUN: 23 mg/dL (ref 8–23)
CO2: 25 mmol/L (ref 22–32)
Calcium: 8.5 mg/dL — ABNORMAL LOW (ref 8.9–10.3)
Chloride: 106 mmol/L (ref 98–111)
Creatinine, Ser: 1.76 mg/dL — ABNORMAL HIGH (ref 0.61–1.24)
GFR, Estimated: 39 mL/min — ABNORMAL LOW (ref >60)
Glucose, Bld: 126 mg/dL — ABNORMAL HIGH (ref 70–99)
Potassium: 3.1 mmol/L — ABNORMAL LOW (ref 3.5–5.1)
Sodium: 140 mmol/L (ref 135–145)

## 2021-04-22 LAB — C-REACTIVE PROTEIN: CRP: 4.3 mg/dL — ABNORMAL HIGH (ref <1.0)

## 2021-04-22 LAB — MAGNESIUM: Magnesium: 2.1 mg/dL (ref 1.7–2.4)

## 2021-04-22 LAB — D-DIMER, QUANTITATIVE: D-Dimer, Quant: 0.43 ug/mL-FEU (ref 0.00–0.50)

## 2021-04-22 LAB — BRAIN NATRIURETIC PEPTIDE: B Natriuretic Peptide: 582.4 pg/mL — ABNORMAL HIGH (ref 0.0–100.0)

## 2021-04-22 LAB — TSH: TSH: 1.593 u[IU]/mL (ref 0.350–4.500)

## 2021-04-22 LAB — TROPONIN I (HIGH SENSITIVITY): Troponin I (High Sensitivity): 54 ng/L — ABNORMAL HIGH

## 2021-04-22 MED ORDER — ALBUTEROL SULFATE HFA 108 (90 BASE) MCG/ACT IN AERS: 2.0000 | INHALATION_SPRAY | RESPIRATORY_TRACT | Status: DC | PRN

## 2021-04-22 MED ORDER — APIXABAN 5 MG PO TABS
5.0000 mg | ORAL_TABLET | Freq: Two times a day (BID) | ORAL | Status: DC
  Administered 2021-04-22 – 2021-04-24 (×4): 5 mg via ORAL
  Filled 2021-04-22 (×4): qty 1

## 2021-04-22 MED ORDER — POTASSIUM CHLORIDE CRYS ER 20 MEQ PO TBCR
20.0000 meq | EXTENDED_RELEASE_TABLET | Freq: Two times a day (BID) | ORAL | Status: AC
  Administered 2021-04-22 – 2021-04-23 (×2): 20 meq via ORAL
  Filled 2021-04-22 (×2): qty 1

## 2021-04-22 MED ORDER — FUROSEMIDE 40 MG PO TABS
40.0000 mg | ORAL_TABLET | Freq: Every day | ORAL | Status: DC
  Administered 2021-04-23 – 2021-04-24 (×2): 40 mg via ORAL
  Filled 2021-04-22 (×2): qty 1

## 2021-04-22 MED ORDER — LOSARTAN POTASSIUM 50 MG PO TABS
50.0000 mg | ORAL_TABLET | Freq: Every day | ORAL | Status: DC
  Administered 2021-04-23 – 2021-04-24 (×2): 50 mg via ORAL
  Filled 2021-04-22 (×2): qty 1

## 2021-04-22 MED ORDER — ATORVASTATIN CALCIUM 40 MG PO TABS
40.0000 mg | ORAL_TABLET | Freq: Every day | ORAL | Status: DC
  Administered 2021-04-22 – 2021-04-23 (×2): 40 mg via ORAL
  Filled 2021-04-22 (×2): qty 1

## 2021-04-22 MED ORDER — VITAMIN D 25 MCG (1000 UNIT) PO TABS
1000.0000 [IU] | ORAL_TABLET | Freq: Every day | ORAL | Status: DC
  Administered 2021-04-23 – 2021-04-24 (×2): 1000 [IU] via ORAL
  Filled 2021-04-22 (×2): qty 1

## 2021-04-22 MED ORDER — MELATONIN 3 MG PO TABS
3.0000 mg | ORAL_TABLET | Freq: Every day | ORAL | Status: DC
  Administered 2021-04-22 – 2021-04-23 (×2): 3 mg via ORAL
  Filled 2021-04-22 (×2): qty 1

## 2021-04-22 MED ORDER — ADULT MULTIVITAMIN W/MINERALS CH
1.0000 | ORAL_TABLET | Freq: Every day | ORAL | Status: DC
  Administered 2021-04-23 – 2021-04-24 (×2): 1 via ORAL
  Filled 2021-04-22 (×2): qty 1

## 2021-04-22 MED ORDER — ENSURE ENLIVE PO LIQD
237.0000 mL | Freq: Two times a day (BID) | ORAL | Status: DC
  Administered 2021-04-23 – 2021-04-24 (×3): 237 mL via ORAL

## 2021-04-22 MED ORDER — POTASSIUM CHLORIDE 10 MEQ/100ML IV SOLN
10.0000 meq | INTRAVENOUS | Status: AC
  Administered 2021-04-22 (×4): 10 meq via INTRAVENOUS
  Filled 2021-04-22 (×4): qty 100

## 2021-04-22 NOTE — ED Provider Notes (Signed)
[facility] DEPT Provider Note   CSN: [id number] Arrival date & time: 04/22/21  1105     History Chief Complaint  Patient presents with   Shortness of Breath   Weakness    Bruce Cohen is a 80 y.o. male.  Patient is a 80 year old male who presents with weakness and shortness of breath.  He has a history of atrial fibrillation on Eliquis, CHF, coronary artery disease, hypertension, hyperlipidemia, prior pericarditis in 1998, thoracic aneurysm.  He states over the last month he has had progressive weakness and shortness of breath.  He says he can only walk a few steps, about 10 steps without becoming short of breath.  He gets dizzy and lightheaded with ambulation.  He has had similar episodes of shortness of breath with atrial fibrillation but not as bad.  Last week he was diagnosed with COVID.  He said his son had tested positive for COVID and he and his wife got tested and both were positive.  He started having symptoms the next day with congestion and a little bit of coughing.  He also had some fevers.  He also had felt at that time he was having a flareup of his urinary tract infections.  His PCP started him on Levaquin and Paxlovid.  He completed both of these medications.  He came in today because of his weakness and shortness of breath.  He is not having ongoing fevers.  No associated chest pain or discomfort.  No leg swelling.      Past Medical History:  Diagnosis Date   CAD (coronary artery disease)    a. LHC 3/16: pLAD 20, mLAD 20, pRCA 20, EF 25%   CHF (congestive heart failure) (HCC)    hx of acute systolic hf- 4098   Dysrhythmia    afib    Heart murmur    since birth    History of PFTs    a. PFTs 8/17: FEV1 88% predicted; FEV1/FVC 81%, corr DLCO 57%   Hyperlipidemia    Hypertension    LVH (left ventricular hypertrophy)    Mitral regurgitation    NICM (nonischemic cardiomyopathy) ([place])    Tachy mediated 2/2 AF with RVR >> a. Echo  3/16: EF 30-35% >> b. Echo 6/16:  EF 50% with mild apical HK, mild LVH, Gr 2 DD, mild MR, severe BAE, PASP 43 mmHg   OSA (obstructive sleep apnea) 02/12/2015   Mild to moderate with AHI 14.7/hr no cpap or oxygen    PAF (paroxysmal atrial fibrillation) ([place]) 09/2014   CHADS2-VASc=5 >> Eliquis // 6/17: Amiodarone started   Pericarditis    a. Hospitalized in [place]. Episode in 1998. Had a cath which was clean (also in 1990s). 2 further episodes that were mild.   PONV (postoperative nausea and vomiting)    Thoracic ascending aortic aneurysm 04/26/2016   High res CT 9/17: 4.0 cm ascending thoracic aortic aneurysm // Chest CTA 9/18: ascending aorta 3.9 cm, CAD, benign thyroid nodules, stable lung reticular changes, aortic atherosclerosis    Patient Active Problem List   Diagnosis Date Noted   Chronic systolic CHF (congestive heart failure) ([place]) 01/15/2020   Acquired thrombophilia ([place]) 01/15/2020   Hypokalemia 01/15/2020   Proteinuria 01/15/2020   Hyponatremia 11/91/4782   Metabolic acidosis 95/62/1308   Hyperglycemia 01/15/2020   Hypoalbuminemia 01/15/2020   Leukocytosis 01/15/2020   Coagulopathy (HCC) with elevated PT and PTT on admission 01/15/2020   Elevated brain natriuretic peptide (BNP) level 01/15/2020   Elevated  lactic acid level 01/15/2020   Bacteremia due to Escherichia coli 01/15/2020   Anemia of infection and chronic disease 01/15/2020   1st degree AV block 01/15/2020   RBBB 01/15/2020   Pleural effusion, left 01/15/2020   Hypertensive heart and kidney disease with HF and with CKD stage III A ([place]) 01/15/2020   Acute on chronic systolic CHF (congestive heart failure) ([place]) 01/15/2020   Physical deconditioning 01/15/2020   Acute lower UTI 01/09/2020   Sepsis ([place]) - Criteria met with SIRS + E. Coli bacteremia + AKI and lactic acidosis 01/09/2020   AKI (acute kidney injury) [facility])    Thoracic ascending aortic aneurysm 04/26/2016   NICM (nonischemic cardiomyopathy) ([place])  01/01/2016   High risk medication use 01/01/2016   LVH (left ventricular hypertrophy) 12/01/2015   CAD (coronary artery disease) 10/12/2015   OSA (obstructive sleep apnea) 02/12/2015   Persistent atrial fibrillation (HCC)    HTN (hypertension) 09/29/2014   Patient nonadherence 09/29/2014   COPD (chronic obstructive pulmonary disease) ([place]) 09/29/2014   History of tobacco abuse 09/29/2014   CKD (chronic kidney disease), stage II 09/29/2014   Interstitial lung disease (HCC)    Nonspecific abnormal unspecified cardiovascular function study 06/28/2013   Mixed hyperlipidemia 06/28/2013    Past Surgical History:  Procedure Laterality Date   [place]   "clean"   CARDIOVERSION N/A 11/21/2014   Procedure: CARDIOVERSION;  Surgeon: Fay Records, MD;  Location: [facility];  Service: Cardiovascular;  Laterality: N/A;   CARDIOVERSION N/A 10/22/2015   Procedure: CARDIOVERSION;  Surgeon: Herminio Commons, MD;  Location: [facility] ENDOSCOPY;  Service: Cardiovascular;  Laterality: N/A;   FRACTURE SURGERY     LEFT HEART CATHETERIZATION WITH CORONARY ANGIOGRAM N/A 10/01/2014   Procedure: LEFT HEART CATHETERIZATION WITH CORONARY ANGIOGRAM;  Surgeon: Wellington Hampshire, MD;  Location: [facility] CATH LAB;  Service: Cardiovascular;  Laterality: N/A;   LESION REMOVAL Left 10/31/2019   Procedure: EXCISION SKIN NEOPLASM LEFT UPPER CHEST WALL, 9X3 CM WITH LAYERED CLOSURE;  Surgeon: Armandina Gemma, MD;  Location: [facility] ORS;  Service: General;  Laterality: Left;   LUMBAR LAMINECTOMY  1970's   "partial"   ORIF FINGER / THUMB FRACTURE Left ~ 2006   "thumb"   TONSILLECTOMY         Family History  Problem Relation Age of Onset   Heart disease Mother        VALVE SURGERY   Stroke Mother    Heart failure Father    CAD Neg Hx    Heart attack Neg Hx     Social History   Tobacco Use   Smoking status: Former    Packs/day: 0.25    Years: 60.00    Pack years: 15.00     Types: Cigarettes    Quit date: 07/11/1998    Years since quitting: 22.7   Smokeless tobacco: Never  Vaping Use   Vaping Use: Never used  Substance Use Topics   Alcohol use: Yes    Alcohol/week: 0.0 standard drinks    Comment: 09/29/2014 "maybe 3-4 beers/month"   Drug use: No    Home Medications Prior to Admission medications   Medication Sig Start Date End Date Taking? Authorizing Provider  amiodarone (PACERONE) 200 MG tablet TAKE 1 TABLET EVERY DAY AND 1/2 TABLET ON SATURDAY AND SUNDAY 12/14/20   Jettie Booze, MD  apixaban (ELIQUIS) 5 MG TABS tablet Take 1 tablet (5 mg total) by mouth  2 (two) times daily. 11/19/14   Richardson Dopp T, PA-C  atorvastatin (LIPITOR) 40 MG tablet Take 1 tablet by mouth at bedtime. 06/16/20   [provider]  cholecalciferol (VITAMIN D3) 25 MCG (1000 UNIT) tablet Take 1,000 Units by mouth daily.    [provider]  furosemide (LASIX) 40 MG tablet TAKE 1 TABLET BY MOUTH EVERY DAY 11/13/20   Jettie Booze, MD  losartan (COZAAR) 25 MG tablet Take 2 tablets by mouth daily.    [provider]  Multiple Vitamin (MULTIVITAMIN WITH MINERALS) TABS tablet Take 1 tablet by mouth daily.    [provider]  Nystatin (GERHARDT'S BUTT CREAM) CREA Apply 1 application topically 4 (four) times daily. 01/14/20   Lavina Hamman, MD  tamsulosin (FLOMAX) 0.4 MG CAPS capsule Take 1 capsule (0.4 mg total) by mouth daily. 01/14/20   Lavina Hamman, MD    Allergies    Patient has no known allergies.  Review of Systems   Review of Systems  Constitutional:  Negative for chills, diaphoresis, fatigue and fever.  HENT:  Negative for congestion, rhinorrhea and sneezing.   Eyes: Negative.   Respiratory:  Positive for cough and shortness of breath. Negative for chest tightness.   Cardiovascular:  Negative for chest pain and leg swelling.  Gastrointestinal:  Negative for abdominal pain, blood in stool, diarrhea, nausea and vomiting.   Genitourinary:  Negative for difficulty urinating, flank pain, frequency and hematuria.  Musculoskeletal:  Negative for arthralgias and back pain.  Skin:  Negative for rash.  Neurological:  Positive for weakness. Negative for dizziness, speech difficulty, numbness and headaches.   Physical Exam Updated Vital Signs BP (!) 109/58   Pulse (!) 43   Temp 97.6 F (36.4 C) (Oral)   Resp 17   SpO2 91%   Physical Exam Constitutional:      Appearance: He is well-developed.  HENT:     Head: Normocephalic and atraumatic.  Eyes:     Pupils: Pupils are equal, round, and reactive to light.  Cardiovascular:     Rate and Rhythm: Normal rate and regular rhythm.     Heart sounds: Normal heart sounds.  Pulmonary:     Effort: Pulmonary effort is normal. No respiratory distress.     Breath sounds: Rales (Few crackles) present. No wheezing.  Chest:     Chest wall: No tenderness.  Abdominal:     General: Bowel sounds are normal.     Palpations: Abdomen is soft.     Tenderness: There is no abdominal tenderness. There is no guarding or rebound.  Musculoskeletal:        General: Normal range of motion.     Cervical back: Normal range of motion and neck supple.     Right lower leg: No edema.     Left lower leg: No edema.  Lymphadenopathy:     Cervical: No cervical adenopathy.  Skin:    General: Skin is warm and dry.     Findings: No rash.  Neurological:     Mental Status: He is alert and oriented to person, place, and time.    ED Results / Procedures / Treatments   Labs (all labs ordered are listed, but only abnormal results are displayed) Labs Reviewed  BASIC METABOLIC PANEL - Abnormal; Notable for the following components:      Result Value   Potassium 3.1 (*)    Glucose, Bld 126 (*)    Creatinine, Ser 1.76 (*)    Calcium  8.5 (*)    GFR, Estimated 39 (*)    All other components within normal limits  CBC - Abnormal; Notable for the following components:   WBC 13.9 (*)    RBC 3.70  (*)    Hemoglobin 12.0 (*)    HCT 36.4 (*)    Platelets 504 (*)    All other components within normal limits  BRAIN NATRIURETIC PEPTIDE - Abnormal; Notable for the following components:   B Natriuretic Peptide 582.4 (*)    All other components within normal limits  RESP PANEL BY RT-PCR (FLU A&B, COVID) [id number]  URINALYSIS, ROUTINE W REFLEX MICROSCOPIC  TROPONIN I (HIGH SENSITIVITY)    EKG EKG Interpretation  Date/Time:  Thursday April 22 2021 13:07:07 EDT Ventricular Rate:  48 PR Interval:  229 QRS Duration: 123 QT Interval:  658 QTC Calculation: 589 R Axis:   83 Text Interpretation: Sinus bradycardia Borderline prolonged PR interval Nonspecific intraventricular conduction delay Probable lateral infarct, age indeterminate Confirmed by Malvin Johns [phone]) on 04/22/2021 1:20:44 PM  Radiology DG Chest Port 1 View  Result Date: 04/22/2021 CLINICAL DATA:  Shortness of breath EXAM: PORTABLE CHEST 1 VIEW COMPARISON:  01/09/2020 FINDINGS: Chronic interstitial lung disease. No new consolidation. No pleural effusion. Stable mild cardiomegaly. IMPRESSION: Chronic interstitial lung disease.  No definite acute abnormality. Electronically Signed   By: Macy Mis M.D.   On: 04/22/2021 12:49    Procedures Procedures   Medications Ordered in ED Medications  albuterol (VENTOLIN HFA) 108 (90 Base) MCG/ACT inhaler 2 puff (has no administration in time range)    ED Course  I have reviewed the triage vital signs and the nursing notes.  Pertinent labs & imaging results that were available during my care of the patient were reviewed by me and considered in my medical decision making (see chart for details).    MDM Rules/Calculators/A&P                           Patient is a 80 year old male who presents with weakness and shortness of breath which has been going on for a month.  He did recently have COVID but his symptoms had started prior to that.  He has no associated chest pain.   No fevers.  His chest x-ray is clear without evidence of pneumonia or pulmonary edema.  His labs are nonconcerning.  His creatinine is mildly elevated but just a bit elevated from his prior values.  His BNP is slightly elevated.  He has noted to be bradycardic with a heart rate in the 40s.  This may be contributing to his symptoms.  I have spoken with cardiology who will see the patient tomorrow.  I did speak with Dr. Marylyn Ishihara who will admit the patient for further treatment. Final Clinical Impression(s) / ED Diagnoses Final diagnoses:  Generalized weakness  Shortness of breath  Bradycardia    Rx / DC Orders ED Discharge Orders     None        Malvin Johns, MD 04/22/21 1503

## 2021-04-22 NOTE — Plan of Care (Signed)
  Problem: Education: Goal: Knowledge of General Education information will improve Description: Including pain rating scale, medication(s)/side effects and non-pharmacologic comfort measures Outcome: Progressing   Problem: Health Behavior/Discharge Planning: Goal: Ability to manage health-related needs will improve Outcome: Progressing   Problem: Nutrition: Goal: Adequate nutrition will be maintained Outcome: Progressing   Problem: Safety: Goal: Ability to remain free from injury will improve Outcome: Progressing   Problem: Skin Integrity: Goal: Risk for impaired skin integrity will decrease Outcome: Progressing   Problem: Education: Goal: Knowledge of risk factors and measures for prevention of condition will improve Outcome: Progressing   Problem: Coping: Goal: Psychosocial and spiritual needs will be supported Outcome: Progressing   Problem: Respiratory: Goal: Will maintain a patent airway Outcome: Progressing Goal: Complications related to the disease process, condition or treatment will be avoided or minimized Outcome: Progressing

## 2021-04-22 NOTE — ED Triage Notes (Signed)
Patient here from home  brought in by son reporting SOB and weakness for 1 month. Reports being COVID positive and finishing meds yesterday. States that he cannot walk far - "maybe 10 steps".

## 2021-04-22 NOTE — ED Notes (Signed)
Blue top tube sent to lab. 

## 2021-04-22 NOTE — H&P (Addendum)
History and Physical    Bruce Cohen JAS:[id number] DOB: June 17, 1941 DOA: 04/22/2021  PCP: Josetta Huddle, MD  Patient coming from: Home  Chief Complaint: dyspnea  HPI: Bruce Cohen is a 80 y.o. male with medical history significant of systolic HF, a fib on eliquis, HTN, CAD. Presenting with dyspnea. He's had dyspnea for the last month. Initially, he thought is was because of a fib, so he largely ignored it. However, as a couple of weeks went by, he noticed that he was developing congestion and fevers. He saw his PCP and on 04/13/21; he was Dx'd with COVID. He was given paxlovid and levaquin. He completed that therapy 2 days ago. While his congestion and fever have improved, he dyspnea has not. He says he is ok at rest. He says his symptoms are present when he tries to move. He can only get 15 or so steps before he is gasping for air. He finds himself dizzy and lightheaded when he tries to get around. He feels as if he is going to pass out. He had not noticed any weight gain or edema during this time. He has not had any CP or palpitations. He has not noticed any dark stools or hematemesis. When his symptoms did not improved today; he decided to come to the ED for help. He denies any other aggravating or alleviating factors.    ED Course: CXR was clear. BNP was 582. He was given albuterol. It was noted that he was bradycardic into the low 40s on tele. Cardiology was consulted. [facility] was called for admission.   Review of Systems:  Denies CP, palpitations, N/V/D, abdominal pain, HA, fevers. Reports recent COVID infection. Review of systems is otherwise negative for all not mentioned in HPI.   PMHx Past Medical History:  Diagnosis Date   CAD (coronary artery disease)    a. LHC 3/16: pLAD 20, mLAD 20, pRCA 20, EF 25%   CHF (congestive heart failure) (HCC)    hx of acute systolic hf- 4193   Dysrhythmia    afib    Heart murmur    since birth    History of PFTs    a. PFTs 8/17: FEV1 88%  predicted; FEV1/FVC 81%, corr DLCO 57%   Hyperlipidemia    Hypertension    LVH (left ventricular hypertrophy)    Mitral regurgitation    NICM (nonischemic cardiomyopathy) ([place])    Tachy mediated 2/2 AF with RVR >> a. Echo 3/16: EF 30-35% >> b. Echo 6/16:  EF 50% with mild apical HK, mild LVH, Gr 2 DD, mild MR, severe BAE, PASP 43 mmHg   OSA (obstructive sleep apnea) 02/12/2015   Mild to moderate with AHI 14.7/hr no cpap or oxygen    PAF (paroxysmal atrial fibrillation) ([place]) 09/2014   CHADS2-VASc=5 >> Eliquis // 6/17: Amiodarone started   Pericarditis    a. Hospitalized in [place]. Episode in 1998. Had a cath which was clean (also in 1990s). 2 further episodes that were mild.   PONV (postoperative nausea and vomiting)    Thoracic ascending aortic aneurysm 04/26/2016   High res CT 9/17: 4.0 cm ascending thoracic aortic aneurysm // Chest CTA 9/18: ascending aorta 3.9 cm, CAD, benign thyroid nodules, stable lung reticular changes, aortic atherosclerosis    PSHx Past Surgical History:  Procedure Laterality Date   [place]   "clean"   CARDIOVERSION N/A 11/21/2014   Procedure: CARDIOVERSION;  Surgeon: Fay Records, MD;  Location[facility] ENDOSCOPY;  Service: Cardiovascular;  Laterality: N/A;   CARDIOVERSION N/A 10/22/2015   Procedure: CARDIOVERSION;  Surgeon: Herminio Commons, MD;  Location: [facility] ENDOSCOPY;  Service: Cardiovascular;  Laterality: N/A;   FRACTURE SURGERY     LEFT HEART CATHETERIZATION WITH CORONARY ANGIOGRAM N/A 10/01/2014   Procedure: LEFT HEART CATHETERIZATION WITH CORONARY ANGIOGRAM;  Surgeon: Wellington Hampshire, MD;  Location: [facility] CATH LAB;  Service: Cardiovascular;  Laterality: N/A;   LESION REMOVAL Left 10/31/2019   Procedure: EXCISION SKIN NEOPLASM LEFT UPPER CHEST WALL, 9X3 CM WITH LAYERED CLOSURE;  Surgeon: Armandina Gemma, MD;  Location: [facility] ORS;  Service: General;  Laterality: Left;   LUMBAR LAMINECTOMY  1970's   "partial"    ORIF FINGER / THUMB FRACTURE Left ~ 2006   "thumb"   TONSILLECTOMY      SocHx  reports that he quit smoking about 22 years ago. His smoking use included cigarettes. He has a 15.00 pack-year smoking history. He has never used smokeless tobacco. He reports current alcohol use. He reports that he does not use drugs.  No Known Allergies  FamHx Family History  Problem Relation Age of Onset   Heart disease Mother        VALVE SURGERY   Stroke Mother    Heart failure Father    CAD Neg Hx    Heart attack Neg Hx     Prior to Admission medications   Medication Sig Start Date End Date Taking? Authorizing Provider  amiodarone (PACERONE) 200 MG tablet TAKE 1 TABLET EVERY DAY AND 1/2 TABLET ON SATURDAY AND SUNDAY 12/14/20   Jettie Booze, MD  apixaban (ELIQUIS) 5 MG TABS tablet Take 1 tablet (5 mg total) by mouth 2 (two) times daily. 11/19/14   Richardson Dopp T, PA-C  atorvastatin (LIPITOR) 40 MG tablet Take 1 tablet by mouth at bedtime. 06/16/20   [provider]  cholecalciferol (VITAMIN D3) 25 MCG (1000 UNIT) tablet Take 1,000 Units by mouth daily.    [provider]  furosemide (LASIX) 40 MG tablet TAKE 1 TABLET BY MOUTH EVERY DAY 11/13/20   Jettie Booze, MD  losartan (COZAAR) 25 MG tablet Take 2 tablets by mouth daily.    [provider]  Multiple Vitamin (MULTIVITAMIN WITH MINERALS) TABS tablet Take 1 tablet by mouth daily.    [provider]  Nystatin (GERHARDT'S BUTT CREAM) CREA Apply 1 application topically 4 (four) times daily. 01/14/20   Lavina Hamman, MD  tamsulosin (FLOMAX) 0.4 MG CAPS capsule Take 1 capsule (0.4 mg total) by mouth daily. 01/14/20   Lavina Hamman, MD    Physical Exam: Vitals:   04/22/21 1230 04/22/21 1315 04/22/21 1330 04/22/21 1415  BP: 110/61 (!) 110/58 (!) 117/56 (!) 109/58  Pulse: (!) 54 (!) 48 (!) 46 (!) 43  Resp: 18 20 (!) 23 17  Temp:      TempSrc:      SpO2: 98% 96% 95% 91%    General: 80 y.o. male  resting in bed in NAD Eyes: PERRL, normal sclera ENMT: Nares patent w/o discharge, orophaynx clear, dentition normal, ears w/o discharge/lesions/ulcers Neck: Supple, trachea midline Cardiovascular: brady, +S1, S2, no m/g/r, equal pulses throughout Respiratory: CTABL, no w/r/r, normal WOB on RA GI: BS+, NDNT, no masses noted, no organomegaly noted MSK: No e/c/c Neuro: A&O x 3, other than hard of hearing, no focal deficits Psyc: Appropriate interaction and affect, calm/cooperative  Labs on Admission: I have  personally reviewed following labs and imaging studies  CBC: Recent Labs  Lab 04/22/21 1154  WBC 13.9*  HGB 12.0*  HCT 36.4*  MCV 98.4  PLT 160*   Basic Metabolic Panel: Recent Labs  Lab 04/22/21 1154  NA 140  K 3.1*  CL 106  CO2 25  GLUCOSE 126*  BUN 23  CREATININE 1.76*  CALCIUM 8.5*   GFR: CrCl cannot be calculated (Unknown ideal weight.). Liver Function Tests: No results for input(s): AST, ALT, ALKPHOS, BILITOT, PROT, ALBUMIN in the last 168 hours. No results for input(s): LIPASE, AMYLASE in the last 168 hours. No results for input(s): AMMONIA in the last 168 hours. Coagulation Profile: No results for input(s): INR, PROTIME in the last 168 hours. Cardiac Enzymes: No results for input(s): CKTOTAL, CKMB, CKMBINDEX, TROPONINI in the last 168 hours. BNP (last 3 results) No results for input(s): PROBNP in the last 8760 hours. HbA1C: No results for input(s): HGBA1C in the last 72 hours. CBG: No results for input(s): GLUCAP in the last 168 hours. Lipid Profile: No results for input(s): CHOL, HDL, LDLCALC, TRIG, CHOLHDL, LDLDIRECT in the last 72 hours. Thyroid Function Tests: No results for input(s): TSH, T4TOTAL, FREET4, T3FREE, THYROIDAB in the last 72 hours. Anemia Panel: No results for input(s): VITAMINB12, FOLATE, FERRITIN, TIBC, IRON, RETICCTPCT in the last 72 hours. Urine analysis:    Component Value Date/Time   COLORURINE YELLOW 01/09/2020 1234    APPEARANCEUR CLOUDY (A) 01/09/2020 1234   LABSPEC 1.016 01/09/2020 1234   PHURINE 5.0 01/09/2020 1234   GLUCOSEU NEGATIVE 01/09/2020 1234   HGBUR SMALL (A) 01/09/2020 1234   BILIRUBINUR NEGATIVE 01/09/2020 1234   KETONESUR NEGATIVE 01/09/2020 1234   PROTEINUR 30 (A) 01/09/2020 1234   NITRITE POSITIVE (A) 01/09/2020 1234   LEUKOCYTESUR LARGE (A) 01/09/2020 1234    Radiological Exams on Admission: DG Chest Port 1 View  Result Date: 04/22/2021 CLINICAL DATA:  Shortness of breath EXAM: PORTABLE CHEST 1 VIEW COMPARISON:  01/09/2020 FINDINGS: Chronic interstitial lung disease. No new consolidation. No pleural effusion. Stable mild cardiomegaly. IMPRESSION: Chronic interstitial lung disease.  No definite acute abnormality. Electronically Signed   By: Macy Mis M.D.   On: 04/22/2021 12:49    EKG: sinus brady, no st elevations  Assessment/Plan Dyspnea on exertion Symptomatic bradycardia Hx of afib     - place in obs, tele     - at rest, he is ok; when he walks more than 15 steps, he gasps for air     - hemodynamically ok at this point     - CXR is clear; his Scr is up, so we are unable to check a CTA chest for PE, but low suspicion given he's on anticoagulation; we will check a d-dimer     - he's on amiodarone for a fib; he has no physical indicators of volume overload; he's BNP is elevated however     - of note, his HR is in the 40s which is new for him per his report     - cardiology has been consulted; appreciate their assistance     - EKG shows sinus brady w/o st elevations and he has no CP     - will hold his amiodarone today's dose; check echo, check trp     - orthostatics will also be checked  Recent COVID infection     - he has completed paxlovid and levaquin     - again, at rest, his sats are good (high 90's); I  do not think this is an inflammatory flare at this point, but we will run a set of markers     - isolation for 10 day period would end tomorrow     - PRN  albuterol, O2 as needed  Chronic systolic HF     - although his BNP is up, no peripheral edema noted, CXR is clear     - will check echo     - continue his home diuretic dosing for now  HTN     - continue home regimen; hold ARB  HLD     - continue home statin  AKI on CKD3a     - watch nephrotoxins; check renal US  Leukocytosis     - no fevers     - CXR is clear     - check UA  Hypokalemia     - replace K+, check Mg2+  DVT prophylaxis: eliquis  Code Status: FULL  Family Communication: None at bedside  Consults called: Cardiology   Status is: Observation  Fields Oros A Marylyn Ishihara DO [facility]  If 7PM-7AM, please contact night-coverage [email or web address]  04/22/2021, 2:59 PM

## 2021-04-22 NOTE — Consult Note (Signed)
Cardiology Consultation:   Patient ID: CURTEZ BRALLIER; [id number]; [birth year]   Admit date: 04/22/2021 Date of Consult: 04/22/2021  Primary Care Provider: Josetta Huddle, MD Primary Cardiologist: Larae Grooms, MD  Primary Electrophysiologist:  None   Patient Profile:   Bruce Cohen is a 80 y.o. male with a hx of atrial fibrillation and dilated cardiomyopathy who is being seen today for the evaluation of bradycardia and shortness of breath at the request of Dr. Marylyn Ishihara.  History of Present Illness:   Bruce Cohen was last seen by Dr. Irish Lack in April 2022.  He has a history of recurrent pericarditis and persistent atrial fibrillation.  He has had a nonischemic cardiomyopathy secondary to tachycardia.  In 2016 his EF was 30 to 35%.  Catheterization demonstrated only mild nonobstructive coronary disease.  He underwent cardioversion on a couple of different occasions.  However, he subsequently had recurrent fibrillation was started on amiodarone.  He did have some improvement in his ejection fraction at the last office appointment was in sinus bradycardia.  His last ejection fraction on echo was 50% in 2016.  He presented to the emergency room today with weakness and shortness of breath.  He had been diagnosed with COVID recently.  He was started on Levaquin and   Paxlovid.  Chest x-ray on admission demonstrates some chronic changes but did not suggest pneumonia or pulmonary edema.  His BNP was very slightly elevated at 582.  He was treated with albuterol.  He reports that his shortness of breath shortness of breath began a few months ago.  He says it predated his COVID.  However, its been slowly progressive.  He is short of breath walking a short distance on level ground.  Not describing PND or orthopnea.  He is not describing palpitations.  He does get short of breath so much that he does get lightheaded but has not had any presyncope or syncope.  He has had no weight gain and in fact  had a 25 pound weight loss because he does not want to eat as much.    He had some cough with COVID.  This is nonproductive.  He has not noticed his heart rate being slow but he does not take his heart rate or blood pressures at home.     Past Medical History:  Diagnosis Date   CAD (coronary artery disease)    a. LHC 3/16: pLAD 20, mLAD 20, pRCA 20, EF 25%   CHF (congestive heart failure) (HCC)    hx of acute systolic hf- 5427   Dysrhythmia    afib    Heart murmur    since birth    History of PFTs    a. PFTs 8/17: FEV1 88% predicted; FEV1/FVC 81%, corr DLCO 57%   Hyperlipidemia    Hypertension    LVH (left ventricular hypertrophy)    Mitral regurgitation    NICM (nonischemic cardiomyopathy) ([place])    Tachy mediated 2/2 AF with RVR >> a. Echo 3/16: EF 30-35% >> b. Echo 6/16:  EF 50% with mild apical HK, mild LVH, Gr 2 DD, mild MR, severe BAE, PASP 43 mmHg   OSA (obstructive sleep apnea) 02/12/2015   Mild to moderate with AHI 14.7/hr no cpap or oxygen    PAF (paroxysmal atrial fibrillation) ([place]) 09/2014   CHADS2-VASc=5 >> Eliquis // 6/17: Amiodarone started   Pericarditis    a. Hospitalized in [place]. Episode in 1998. Had a cath which was clean (also in 1990s). 2 further  episodes that were mild.   PONV (postoperative nausea and vomiting)    Thoracic ascending aortic aneurysm 04/26/2016   High res CT 9/17: 4.0 cm ascending thoracic aortic aneurysm // Chest CTA 9/18: ascending aorta 3.9 cm, CAD, benign thyroid nodules, stable lung reticular changes, aortic atherosclerosis    Past Surgical History:  Procedure Laterality Date   [place]   "clean"   CARDIOVERSION N/A 11/21/2014   Procedure: CARDIOVERSION;  Surgeon: Fay Records, MD;  Location: [place];  Service: Cardiovascular;  Laterality: N/A;   CARDIOVERSION N/A 10/22/2015   Procedure: CARDIOVERSION;  Surgeon: Herminio Commons, MD;  Location: [place];  Service:  Cardiovascular;  Laterality: N/A;   FRACTURE SURGERY     LEFT HEART CATHETERIZATION WITH CORONARY ANGIOGRAM N/A 10/01/2014   Procedure: LEFT HEART CATHETERIZATION WITH CORONARY ANGIOGRAM;  Surgeon: Wellington Hampshire, MD;  Location: [place] [facility];  Service: Cardiovascular;  Laterality: N/A;   LESION REMOVAL Left 10/31/2019   Procedure: EXCISION SKIN NEOPLASM LEFT UPPER CHEST WALL, 9X3 CM WITH LAYERED CLOSURE;  Surgeon: Armandina Gemma, MD;  Location: [facility];  Service: General;  Laterality: Left;   LUMBAR LAMINECTOMY  1970's   "partial"   ORIF FINGER / THUMB FRACTURE Left ~ 2006   "thumb"   TONSILLECTOMY       Home Medications:  Prior to Admission medications   Medication Sig Start Date End Date Taking? Authorizing Provider  amiodarone (PACERONE) 200 MG tablet TAKE 1 TABLET EVERY DAY AND 1/2 TABLET ON SATURDAY AND SUNDAY 12/14/20  Yes Jettie Booze, MD  apixaban (ELIQUIS) 5 MG TABS tablet Take 1 tablet (5 mg total) by mouth 2 (two) times daily. 11/19/14  Yes Weaver, Scott T, PA-C  atorvastatin (LIPITOR) 40 MG tablet Take 1 tablet by mouth at bedtime. 06/16/20  Yes [provider]  cholecalciferol (VITAMIN D3) 25 MCG (1000 UNIT) tablet Take 1,000 Units by mouth daily.   Yes [provider]  furosemide (LASIX) 40 MG tablet TAKE 1 TABLET BY MOUTH EVERY DAY 11/13/20  Yes Jettie Booze, MD  losartan (COZAAR) 25 MG tablet Take 2 tablets by mouth daily.   Yes [provider]  Multiple Vitamin (MULTIVITAMIN WITH MINERALS) TABS tablet Take 1 tablet by mouth daily.   Yes [provider]  Nystatin (GERHARDT'S BUTT CREAM) CREA Apply 1 application topically 4 (four) times daily. Patient not taking: Reported on 04/22/2021 01/14/20   Lavina Hamman, MD  tamsulosin (FLOMAX) 0.4 MG CAPS capsule Take 1 capsule (0.4 mg total) by mouth daily. Patient not taking: Reported on 04/22/2021 01/14/20   Lavina Hamman, MD    Inpatient Medications: Scheduled Meds:  apixaban  5 mg  Oral BID   atorvastatin  40 mg Oral QHS   [START ON 04/23/2021] cholecalciferol  1,000 Units Oral Daily   [START ON 04/23/2021] furosemide  40 mg Oral Daily   [START ON 04/23/2021] losartan  50 mg Oral Daily   [START ON 04/23/2021] multivitamin with minerals  1 tablet Oral Daily   potassium chloride  20 mEq Oral BID   Continuous Infusions:  potassium chloride     PRN Meds: albuterol  Allergies:   No Known Allergies  Social History:   Social History   Socioeconomic History   Marital status: Married    Spouse name: Not on file   Number of children: 3   Years of education: 17 years  Highest education level: Not on file  Occupational History   Not on file  Tobacco Use   Smoking status: Former    Packs/day: 0.25    Years: 60.00    Pack years: 15.00    Types: Cigarettes    Quit date: 07/11/1998    Years since quitting: 22.7   Smokeless tobacco: Never  Vaping Use   Vaping Use: Never used  Substance and Sexual Activity   Alcohol use: Yes    Alcohol/week: 0.0 standard drinks    Comment: 09/29/2014 "maybe 3-4 beers/month"   Drug use: No   Sexual activity: Not Currently  Other Topics Concern   Not on file  Social History Narrative   Not on file   Social Determinants of Health   Financial Resource Strain: Not on file  Food Insecurity: Not on file  Transportation Needs: Not on file  Physical Activity: Not on file  Stress: Not on file  Social Connections: Not on file  Intimate Partner Violence: Not on file    Family History:    Family History  Problem Relation Age of Onset   Heart disease Mother        VALVE SURGERY   Stroke Mother    Heart failure Father    CAD Neg Hx    Heart attack Neg Hx      ROS:  Please see the history of present illness.  All other ROS reviewed and negative.     Physical Exam/Data:   Vitals:   04/22/21 1500 04/22/21 1606 04/22/21 1651 04/22/21 1726  BP: (!) 129/59 (!) 145/76 136/67   Pulse: (!) 46 (!) 31 (!) 53   Resp: 18 14 20     Temp:   98.4 F (36.9 C)   TempSrc:      SpO2: 93% 94% 100%   Weight:    76.8 kg  Height:    6\' 3"  (1.905 m)   No intake or output data in the 24 hours ending 04/22/21 1747 Filed Weights   04/22/21 1726  Weight: 76.8 kg   Body mass index is 21.15 kg/m.  GENERAL: Not acutely ill appearing HEENT:   Pupils equal round and reactive, fundi not visualized, oral mucosa unremarkable NECK:  No  jugular venous distention, waveform within normal limits, carotid upstroke brisk and symmetric, no bruits, no thyromegaly LYMPHATICS:  No cervical, inguinal adenopathy LUNGS: Diffuse fine crackles BACK:  No CVA tenderness CHEST:   Unremarkable HEART:  PMI not displaced or sustained,S1 and S2 within normal limits, no S3, no S4, no clicks, no rubs, no murmurs ABD:  Flat, positive bowel sounds normal in frequency in pitch, no bruits, no rebound, no guarding, no midline pulsatile mass, no hepatomegaly, no splenomegaly EXT:  2 plus pulses throughout, no edema, no cyanosis no clubbing SKIN:  No rashes no nodules NEURO:   Cranial nerves II through XII grossly intact, motor grossly intact throughout PSYCH:    Cognitively intact, oriented to person place and time   EKG:  The EKG was personally reviewed and demonstrates: Sinus bradycardia, rate 48, lateral Q waves, left ventricular perjury by voltage criteria.  No significant change from previous EKG in April 2022. Telemetry:  Telemetry was personally reviewed and demonstrates: Sinus bradycardia  Relevant CV Studies: Echo pending.    Laboratory Data:  Chemistry Recent Labs  Lab 04/22/21 1154  NA 140  K 3.1*  CL 106  CO2 25  GLUCOSE 126*  BUN 23  CREATININE 1.76*  CALCIUM 8.5*  GFRNONAA 39*  ANIONGAP 9    No results for input(s): PROT, ALBUMIN, AST, ALT, ALKPHOS, BILITOT in the last 168 hours. Hematology Recent Labs  Lab 04/22/21 1154  WBC 13.9*  RBC 3.70*  HGB 12.0*  HCT 36.4*  MCV 98.4  MCH 32.4  MCHC 33.0  RDW 13.7  PLT 504*    Cardiac EnzymesNo results for input(s): TROPONINI in the last 168 hours. No results for input(s): TROPIPOC in the last 168 hours.  BNP Recent Labs  Lab 04/22/21 1154  BNP 582.4*    DDimer No results for input(s): DDIMER in the last 168 hours.  Radiology/Studies:  DG Chest Port 1 View  Result Date: 04/22/2021 CLINICAL DATA:  Shortness of breath EXAM: PORTABLE CHEST 1 VIEW COMPARISON:  01/09/2020 FINDINGS: Chronic interstitial lung disease. No new consolidation. No pleural effusion. Stable mild cardiomegaly. IMPRESSION: Chronic interstitial lung disease.  No definite acute abnormality. Electronically Signed   By: Macy Mis M.D.   On: 04/22/2021 12:49    Assessment and Plan:   Persistent atrial fibrillation: Patient is maintaining sinus bradycardia.    I agree with continuing Eliquis.  It is reasonable to continue the amiodarone pending his other work-up.  He is not on any other AV nodal blocking agents and there is no indication at this moment for pacing.  There could be some component of chronotropic incompetence but this is a diagnosis of exclusion.  He has not done well in atrial fibrillation before so maintaining sinus rhythm is important.  I would suggest pulmonary function testing eventually given his long-term amiodarone.  I think further change in therapy or management would be based on any persistent dyspnea as he gets further from [place].    Nonischemic cardiomyopathy:   Echocardiogram is pending.  I agree with continuing his home diuretic.  SOB:   I would recommend non CT given his fine crackles on exam and his particular pattern disease, distant CT.  Thoracic aortic dilatation: He can have follow-up CT of this as an outpatient.  Hypertension:   His BP is at target.  No change in therapy.    CKD: His creatinine is up above his baseline which appears to be about 1.29.  For now I would hold his Cozaar.  Chronic pericarditis: He has used prednisone on previous  occasions.  For questions or updates, please contact [place] Please consult [email or web address] for contact info under Cardiology/STEMI.   Signed, Minus Breeding, MD  04/22/2021 5:47 PM

## 2021-04-23 ENCOUNTER — Observation Stay (HOSPITAL_BASED_OUTPATIENT_CLINIC_OR_DEPARTMENT_OTHER): Payer: Medicare Other

## 2021-04-23 ENCOUNTER — Observation Stay (HOSPITAL_COMMUNITY): Payer: Medicare Other

## 2021-04-23 DIAGNOSIS — I5022 Chronic systolic (congestive) heart failure: Secondary | ICD-10-CM

## 2021-04-23 DIAGNOSIS — J849 Interstitial pulmonary disease, unspecified: Secondary | ICD-10-CM | POA: Diagnosis not present

## 2021-04-23 DIAGNOSIS — R0602 Shortness of breath: Secondary | ICD-10-CM | POA: Diagnosis not present

## 2021-04-23 DIAGNOSIS — J84112 Idiopathic pulmonary fibrosis: Secondary | ICD-10-CM

## 2021-04-23 DIAGNOSIS — J479 Bronchiectasis, uncomplicated: Secondary | ICD-10-CM | POA: Diagnosis not present

## 2021-04-23 DIAGNOSIS — I700 Atherosclerosis of aorta: Secondary | ICD-10-CM | POA: Diagnosis not present

## 2021-04-23 DIAGNOSIS — R531 Weakness: Secondary | ICD-10-CM

## 2021-04-23 DIAGNOSIS — J9601 Acute respiratory failure with hypoxia: Secondary | ICD-10-CM | POA: Diagnosis not present

## 2021-04-23 DIAGNOSIS — N179 Acute kidney failure, unspecified: Secondary | ICD-10-CM

## 2021-04-23 DIAGNOSIS — I4819 Other persistent atrial fibrillation: Secondary | ICD-10-CM | POA: Diagnosis not present

## 2021-04-23 DIAGNOSIS — R001 Bradycardia, unspecified: Secondary | ICD-10-CM | POA: Diagnosis not present

## 2021-04-23 LAB — ECHOCARDIOGRAM COMPLETE
Area-P 1/2: 1.44 cm2
Calc EF: 49 %
Height: 75 in
S' Lateral: 4.2 cm
Single Plane A2C EF: 50.1 %
Single Plane A4C EF: 49.5 %
Weight: 2708 oz

## 2021-04-23 LAB — COMPREHENSIVE METABOLIC PANEL
ALT: 18 U/L (ref 0–44)
AST: 30 U/L (ref 15–41)
Albumin: 2.7 g/dL — ABNORMAL LOW (ref 3.5–5.0)
Alkaline Phosphatase: 68 U/L (ref 38–126)
Anion gap: 7 (ref 5–15)
BUN: 22 mg/dL (ref 8–23)
CO2: 22 mmol/L (ref 22–32)
Calcium: 8.1 mg/dL — ABNORMAL LOW (ref 8.9–10.3)
Chloride: 109 mmol/L (ref 98–111)
Creatinine, Ser: 1.45 mg/dL — ABNORMAL HIGH (ref 0.61–1.24)
GFR, Estimated: 49 mL/min — ABNORMAL LOW (ref >60)
Glucose, Bld: 91 mg/dL (ref 70–99)
Potassium: 3.4 mmol/L — ABNORMAL LOW (ref 3.5–5.1)
Sodium: 138 mmol/L (ref 135–145)
Total Bilirubin: 0.6 mg/dL (ref 0.3–1.2)
Total Protein: 6.7 g/dL (ref 6.5–8.1)

## 2021-04-23 LAB — CBC WITH DIFFERENTIAL/PLATELET
Abs Immature Granulocytes: 0.12 10*3/uL — ABNORMAL HIGH (ref 0.00–0.07)
Basophils Absolute: 0.1 10*3/uL (ref 0.0–0.1)
Basophils Relative: 1 %
Eosinophils Absolute: 0.2 10*3/uL (ref 0.0–0.5)
Eosinophils Relative: 2 %
HCT: 32.5 % — ABNORMAL LOW (ref 39.0–52.0)
Hemoglobin: 10.6 g/dL — ABNORMAL LOW (ref 13.0–17.0)
Immature Granulocytes: 2 %
Lymphocytes Relative: 22 %
Lymphs Abs: 1.8 10*3/uL (ref 0.7–4.0)
MCH: 32 pg (ref 26.0–34.0)
MCHC: 32.6 g/dL (ref 30.0–36.0)
MCV: 98.2 fL (ref 80.0–100.0)
Monocytes Absolute: 0.7 10*3/uL (ref 0.1–1.0)
Monocytes Relative: 8 %
Neutro Abs: 5.4 10*3/uL (ref 1.7–7.7)
Neutrophils Relative %: 65 %
Platelets: 345 10*3/uL (ref 150–400)
RBC: 3.31 MIL/uL — ABNORMAL LOW (ref 4.22–5.81)
RDW: 13.8 % (ref 11.5–15.5)
WBC: 8.3 10*3/uL (ref 4.0–10.5)
nRBC: 0 % (ref 0.0–0.2)

## 2021-04-23 LAB — FERRITIN: Ferritin: 254 ng/mL (ref 24–336)

## 2021-04-23 LAB — MAGNESIUM: Magnesium: 2.2 mg/dL (ref 1.7–2.4)

## 2021-04-23 LAB — D-DIMER, QUANTITATIVE: D-Dimer, Quant: 0.3 ug/mL-FEU (ref 0.00–0.50)

## 2021-04-23 LAB — CK: Total CK: 31 U/L — ABNORMAL LOW (ref 49–397)

## 2021-04-23 LAB — C-REACTIVE PROTEIN: CRP: 5 mg/dL — ABNORMAL HIGH (ref <1.0)

## 2021-04-23 MED ORDER — POLYETHYLENE GLYCOL 3350 17 G PO PACK
17.0000 g | PACK | Freq: Every day | ORAL | Status: DC | PRN
  Administered 2021-04-23: 17 g via ORAL
  Filled 2021-04-23: qty 1

## 2021-04-23 MED ORDER — POTASSIUM CHLORIDE CRYS ER 20 MEQ PO TBCR
40.0000 meq | EXTENDED_RELEASE_TABLET | Freq: Once | ORAL | Status: AC
  Administered 2021-04-23: 40 meq via ORAL
  Filled 2021-04-23: qty 2

## 2021-04-23 MED ORDER — AMIODARONE HCL 100 MG PO TABS
100.0000 mg | ORAL_TABLET | Freq: Every day | ORAL | Status: DC
  Administered 2021-04-24: 100 mg via ORAL
  Filled 2021-04-23: qty 1

## 2021-04-23 NOTE — Plan of Care (Signed)
  Problem: Education: Goal: Knowledge of General Education information will improve Description: Including pain rating scale, medication(s)/side effects and non-pharmacologic comfort measures Outcome: Progressing   Problem: Health Behavior/Discharge Planning: Goal: Ability to manage health-related needs will improve Outcome: Progressing   Problem: Nutrition: Goal: Adequate nutrition will be maintained Outcome: Progressing   Problem: Safety: Goal: Ability to remain free from injury will improve Outcome: Progressing   Problem: Skin Integrity: Goal: Risk for impaired skin integrity will decrease Outcome: Progressing   Problem: Education: Goal: Knowledge of risk factors and measures for prevention of condition will improve Outcome: Progressing   Problem: Coping: Goal: Psychosocial and spiritual needs will be supported Outcome: Progressing   Problem: Respiratory: Goal: Will maintain a patent airway Outcome: Progressing Goal: Complications related to the disease process, condition or treatment will be avoided or minimized Outcome: Progressing

## 2021-04-23 NOTE — Progress Notes (Signed)
Progress Note  Patient Name: Bruce Cohen Date of Encounter: 04/23/2021  Primary Cardiologist:   Larae Grooms, MD   Subjective   He is in no distress.  He is in no pain.  He is short of breath still and has not been yet out of bed.  Inpatient Medications    Scheduled Meds:  apixaban  5 mg Oral BID   atorvastatin  40 mg Oral QHS   cholecalciferol  1,000 Units Oral Daily   feeding supplement  237 mL Oral BID BM   furosemide  40 mg Oral Daily   losartan  50 mg Oral Daily   melatonin  3 mg Oral QHS   multivitamin with minerals  1 tablet Oral Daily   Continuous Infusions:  PRN Meds: albuterol   Vital Signs    Vitals:   04/22/21 1651 04/22/21 1726 04/22/21 2037 04/22/21 2349  BP: 136/67  126/65 124/64  Pulse: (!) 53  (!) 48 (!) 48  Resp: 20  16   Temp: 98.4 F (36.9 C)  97.8 F (36.6 C) 97.9 F (36.6 C)  TempSrc:   Oral Oral  SpO2: 100%  93% 97%  Weight:  76.8 kg    Height:  6\' 3"  (1.905 m)      Intake/Output Summary (Last 24 hours) at 04/23/2021 0954 Last data filed at 04/23/2021 0400 Gross per 24 hour  Intake 400 ml  Output 200 ml  Net 200 ml   Filed Weights   04/22/21 1726  Weight: 76.8 kg    Telemetry    Sinus bradycardia, no pauses- Personally Reviewed  ECG    NA - Personally Reviewed  Physical Exam   GEN: No acute distress.   Neck: No  JVD Cardiac:  Regular RR, no murmurs, rubs, or gallops.  Respiratory:    Diffuse fine crackles GI: Soft, nontender, non-distended  MS: No edema; No deformity. Neuro:  Nonfocal  Psych: Normal affect   Labs    Chemistry Recent Labs  Lab 04/22/21 1154 04/23/21 0339  NA 140 138  K 3.1* 3.4*  CL 106 109  CO2 25 22  GLUCOSE 126* 91  BUN 23 22  CREATININE 1.76* 1.45*  CALCIUM 8.5* 8.1*  PROT  --  6.7  ALBUMIN  --  2.7*  AST  --  30  ALT  --  18  ALKPHOS  --  68  BILITOT  --  0.6  GFRNONAA 39* 49*  ANIONGAP 9 7     Hematology Recent Labs  Lab 04/22/21 1154 04/23/21 0339  WBC  13.9* 8.3  RBC 3.70* 3.31*  HGB 12.0* 10.6*  HCT 36.4* 32.5*  MCV 98.4 98.2  MCH 32.4 32.0  MCHC 33.0 32.6  RDW 13.7 13.8  PLT 504* 345    Cardiac EnzymesNo results for input(s): TROPONINI in the last 168 hours. No results for input(s): TROPIPOC in the last 168 hours.   BNP Recent Labs  Lab 04/22/21 1154  BNP 582.4*     DDimer  Recent Labs  Lab 04/22/21 1713 04/23/21 0339  DDIMER 0.43 0.30     Radiology    US RENAL  Result Date: 04/22/2021 CLINICAL DATA:  Inpatient.  Acute kidney injury. EXAM: RENAL / URINARY TRACT ULTRASOUND COMPLETE COMPARISON:  01/09/2020 renal sonogram. FINDINGS: Right Kidney: Renal measurements: 10.5 x 4.4 x 4.9 cm = volume: 119 mL. Echogenicity within normal limits. No mass or hydronephrosis visualized. Left Kidney: Renal measurements: 10.7 x 4.4 x 5.6 cm = volume: 137 mL.  Echogenicity within normal limits. No mass or hydronephrosis visualized. Bladder: Diffuse bladder wall thickening and trabeculation. Other: Mildly enlarged 4.3 x 4.1 x 4.6 cm prostate (prostate volume 42 cc). IMPRESSION: 1. Normal kidneys, with no hydronephrosis. 2. Diffuse bladder wall thickening and trabeculation, presumably due to chronic bladder outlet obstruction by the enlarged prostate. Electronically Signed   By: Ilona Sorrel M.D.   On: 04/22/2021 19:46   DG Chest Port 1 View  Result Date: 04/22/2021 CLINICAL DATA:  Shortness of breath EXAM: PORTABLE CHEST 1 VIEW COMPARISON:  01/09/2020 FINDINGS: Chronic interstitial lung disease. No new consolidation. No pleural effusion. Stable mild cardiomegaly. IMPRESSION: Chronic interstitial lung disease.  No definite acute abnormality. Electronically Signed   By: Macy Mis M.D.   On: 04/22/2021 12:49    Cardiac Studies   Echo: Pending  Patient Profile     80 y.o. male with a history of atrial fibrillation and dilated cardiomyopathy who is seen for evaluation of bradycardia and shortness of breath.  Assessment & Plan     Persistent atrial fibrillation:   He is maintaining sinus bradycardia.  I do not think that chronotropic incompetence is leading to his symptoms.  Rather I would suggest this is a primary pulmonary process.  He may ultimately need to come off of amiodarone if there is thought to be a progressive pulmonary finding on CT and he should have eventual pulmonary function testing.  However, for now I will continue the amiodarone.  There is no indication for pacing.   He remains on anticoagulation.  Nonischemic cardiomyopathy:   Echocardiogram is pending.  Continue meds as on MAR.   He does have a slightly elevated BNP.  However, with his renal insufficiency I would not aggressively diurese.    Thoracic aortic dilatation: He is getting a noncontrasted CT but eventually will need contrasted CT for follow-up routinely.   Hypertension:   His BP is at target.  No change in therapy.     CKD: His creatinine is down slightly today.   Resume Cozaar.  If his ejection fraction is further reduced I would suggest switching to [facility].    We could also consider [place].  Chronic pericarditis: He has no symptoms of active disease.  COVID: He has had recent COVID but he says his shortness of breath predated this.  SOB: Plan as above.  Also discussed with the primary team.  We will get a noncontrasted high-resolution CT.  He has had some reticular pattern on previous CT and had seen pulmonary in the past.  Elevated troponin: This is nonspecific.  Cycle another enzyme but I am not suspecting an acute coronary process.  For questions or updates, please contact [place] Please consult [email or web address] for contact info under Cardiology/STEMI.   Signed, Minus Breeding, MD  04/23/2021, 9:54 AM

## 2021-04-23 NOTE — Progress Notes (Signed)
  Echocardiogram 2D Echocardiogram has been performed.  Carolynn Serve Corneilus Heggie 04/23/2021, 4:16 PM

## 2021-04-23 NOTE — Consult Note (Signed)
NAME:  Bruce Cohen, MRN:  [id number], DOB:  [birth year], LOS: 0 ADMISSION DATE:  04/22/2021, CONSULTATION DATE:  04/23/2021 REFERRING MD:  Dr. Cruzita Lederer, CHIEF COMPLAINT:  SOB   History of Present Illness:  80 year old male with prior history of former smoker, untreated OSA, Afib on Eliquis, HTN, CAD, non-ischemic cardiomyopathy, chronic pericarditis, and CKD IIIa who presented with two month history of progressive dyspnea.  He ignored it as he thought it was related to his Afib.  He then developed fever and congestion and diagnosed with COVID on 10/4, treated with Paxlovid and levaquin, in which his fevers and congestion resolved but his dyspnea did not.  Dyspnea is exertional and he reports he gasp for air if taking more than a few steps, and becoming dizzy and light-headed, thus he presented to the ER.    No weight gain, edema, hemoptysis, chest pain, palpitations, dark stools or hematemesis.  Last seen in our office on 03/08/2016 by Dr. Lamonte Sakai.  During that time he did not want to wear CPAP for his OSA and PFTs were reassuring given his smoking history for COPD.  Some concern for ILD dating back to chest CT in 09/2014 with scattered infiltrates but were in the setting of acute illness while being treated for a pneumonia and heart failure.  He was started on amiodarone 12/2015, but has CXR changes of mild diffuse interstitial prominence that predates starting amiodarone.  Chest CT was repeated on 03/2016 which showed resolved areas of consolidation, interlobular septal thickening and subpleural reticulation throughout both lungs, but favored to represent mild pulmonary edema, decreased mild mediastinal lymphadenopathy c/w benign reactive adenopathy, however NSIP could not be excluded.  He was then lost in pulmonary follow-up.  Workup with BNP 582, CXR non acute, and noted to be bradycardic in Afib in the 40's.  Noted that at rest, oxygen saturations are good, but has dipped down in the 80's with  exertion.  Some concern that exertional dyspnea was related to bradycardia, but Cardiology did not feel that it was related.  Given his prior history, an HRCT was repeated which showed no acute airspace opacities but did show moderate pulmonary fibrosis in apical to basal gradient pattern, consistent with probable UIP, thus pulmonary was consulted for further recommendations.   Pertinent  Medical History  Former smoker (40+ pack years), OSA, Afib on Eliquis, HTN, CAD, non-ischemic cardiomyopathy, chronic pericarditis, CKD IIIa  Significant Hospital Events: Including procedures, antibiotic start and stop dates in addition to other pertinent events   10/13 admitted [facility]   10/14 HRCT >> 1. There is moderate pulmonary fibrosis in a pattern with apical to basal gradient, featuring irregular peripheral interstitial opacity, interlobular septal thickening, subtle traction bronchiectasis, and small areas of subpleural bronchiolectasis. Fibrotic findings are significantly worsened compared to prior examination dated 2017. Findings are categorized as probable UIP per consensus guidelines. 2. No acute airspace opacity. 3. Coronary artery disease.  Interim History / Subjective:   Patient feeling better since admission. He complains of significant dyspnea with exertion. He reports a 20-30lbs weight loss as he has been to short of breath or fatigued to fix meals. He has had wheezing since being diagnosed with covid. He lives at home with his wife and son.  Objective   Blood pressure (!) 143/66, pulse (!) 51, temperature 97.8 F (36.6 C), temperature source Oral, resp. rate 18, height 6\' 3"  (1.905 m), weight 76.8 kg, SpO2 97 %.        Intake/Output Summary (Last  24 hours) at 04/23/2021 1416 Last data filed at 04/23/2021 1100 Gross per 24 hour  Intake 520 ml  Output 200 ml  Net 320 ml   Filed Weights   04/22/21 1726  Weight: 76.8 kg   Examination: General:  elderly male, no acute distress HEENT:  MM pink/moist, [place]/AT, PERRL Neuro: alert, oriented, moving all extremities CV: s1s2 rrr, no m/r/g PULM:  scattered dry crackles posteriorly. No wheezing. GI: soft, bsx4 active  Extremities: warm/dry, no edema  Skin: no rashes or lesions   Resolved Hospital Problem list    Assessment & Plan:   Pulmonary Fibrosis ILD with HRCT most consistent with UIP pattern  - on amiodarone since 12/2015 but did have CXR changes that predates starting amiodarone P:  - resting and ambulatory O2 saturations to see if qualifies for home oxygen - HSP, autoimmune panel ordered - no role for steroids at this time - will need outpatient follow up in our office for  PFTs and discussion of possible antifibrotics (nintedanib or pirfenidone)  - Will benefit from outpatient pulmonary rehab in the future    Remainder per primary team.    Recent COVID- resolved, due to come off precautions 10/15 ICM- cardiology following, pending TTE PAF on Eliquis- ongoing sinus bradycardia HTN AoCKD IIIa- improving HLD  Labs   CBC: Recent Labs  Lab 04/22/21 1154 04/23/21 0339  WBC 13.9* 8.3  NEUTROABS  --  5.4  HGB 12.0* 10.6*  HCT 36.4* 32.5*  MCV 98.4 98.2  PLT 504* 626    Basic Metabolic Panel: Recent Labs  Lab 04/22/21 1154 04/22/21 1714 04/23/21 0339  NA 140  --  138  K 3.1*  --  3.4*  CL 106  --  109  CO2 25  --  22  GLUCOSE 126*  --  91  BUN 23  --  22  CREATININE 1.76*  --  1.45*  CALCIUM 8.5*  --  8.1*  MG  --  2.1 2.2   GFR: Estimated Creatinine Clearance: 44.1 mL/min (A) (by C-G formula based on SCr of 1.45 mg/dL (H)). Recent Labs  Lab 04/22/21 1154 04/23/21 0339  WBC 13.9* 8.3    Liver Function Tests: Recent Labs  Lab 04/23/21 0339  AST 30  ALT 18  ALKPHOS 68  BILITOT 0.6  PROT 6.7  ALBUMIN 2.7*   No results for input(s): LIPASE, AMYLASE in the last 168 hours. No results for input(s): AMMONIA in the last 168 hours.  ABG No results found for: PHART, PCO2ART, PO2ART,  HCO3, TCO2, ACIDBASEDEF, O2SAT   Coagulation Profile: No results for input(s): INR, PROTIME in the last 168 hours.  Cardiac Enzymes: No results for input(s): CKTOTAL, CKMB, CKMBINDEX, TROPONINI in the last 168 hours.  HbA1C: No results found for: HGBA1C  CBG: No results for input(s): GLUCAP in the last 168 hours.  Review of Systems:   Review of Systems  Constitutional:  Positive for malaise/fatigue and weight loss. Negative for chills and fever.  HENT:  Negative for congestion, sinus pain and sore throat.   Eyes: Negative.   Respiratory:  Positive for cough, shortness of breath and wheezing. Negative for hemoptysis and sputum production.   Cardiovascular:  Negative for chest pain, palpitations, orthopnea, claudication and leg swelling.  Gastrointestinal:  Negative for abdominal pain, heartburn, nausea and vomiting.  Genitourinary: Negative.   Musculoskeletal:  Negative for joint pain and myalgias.  Skin:  Negative for rash.  Neurological:  Positive for weakness.  Endo/Heme/Allergies: Negative.   Psychiatric/Behavioral:  Negative.      Past Medical History:  He,  has a past medical history of CAD (coronary artery disease), CHF (congestive heart failure) ([place]), History of PFTs, Hyperlipidemia, Hypertension, LVH (left ventricular hypertrophy), Mitral regurgitation, NICM (nonischemic cardiomyopathy) ([place]), OSA (obstructive sleep apnea) (02/12/2015), PAF (paroxysmal atrial fibrillation) ([place]) (09/2014), Pericarditis, PONV (postoperative nausea and vomiting), and Thoracic ascending aortic aneurysm (04/26/2016).   Surgical History:   Past Surgical History:  Procedure Laterality Date   [place]   "clean"   CARDIOVERSION N/A 11/21/2014   Procedure: CARDIOVERSION;  Surgeon: Fay Records, MD;  Location: [facility];  Service: Cardiovascular;  Laterality: N/A;   CARDIOVERSION N/A 10/22/2015   Procedure: CARDIOVERSION;  Surgeon:  Herminio Commons, MD;  Location: [facility];  Service: Cardiovascular;  Laterality: N/A;   FRACTURE SURGERY     LEFT HEART CATHETERIZATION WITH CORONARY ANGIOGRAM N/A 10/01/2014   Procedure: LEFT HEART CATHETERIZATION WITH CORONARY ANGIOGRAM;  Surgeon: Wellington Hampshire, MD;  Location: [facility];  Service: Cardiovascular;  Laterality: N/A;   LESION REMOVAL Left 10/31/2019   Procedure: EXCISION SKIN NEOPLASM LEFT UPPER CHEST WALL, 9X3 CM WITH LAYERED CLOSURE;  Surgeon: Armandina Gemma, MD;  Location: [facility];  Service: General;  Laterality: Left;   LUMBAR LAMINECTOMY  1970's   "partial"   ORIF FINGER / THUMB FRACTURE Left ~ 2006   "thumb"   TONSILLECTOMY       Social History:   reports that he quit smoking about 22 years ago. His smoking use included cigarettes. He has a 15.00 pack-year smoking history. He has never used smokeless tobacco. He reports current alcohol use. He reports that he does not use drugs.   Family History:  His family history includes Heart disease in his mother; Heart failure in his father; Stroke in his mother. There is no history of CAD or Heart attack.   Allergies No Known Allergies   Home Medications  Prior to Admission medications   Medication Sig Start Date End Date Taking? Authorizing Provider  amiodarone (PACERONE) 200 MG tablet TAKE 1 TABLET EVERY DAY AND 1/2 TABLET ON SATURDAY AND SUNDAY 12/14/20  Yes Jettie Booze, MD  apixaban (ELIQUIS) 5 MG TABS tablet Take 1 tablet (5 mg total) by mouth 2 (two) times daily. 11/19/14  Yes Weaver, Scott T, PA-C  atorvastatin (LIPITOR) 40 MG tablet Take 1 tablet by mouth at bedtime. 06/16/20  Yes [provider]  cholecalciferol (VITAMIN D3) 25 MCG (1000 UNIT) tablet Take 1,000 Units by mouth daily.   Yes [provider]  furosemide (LASIX) 40 MG tablet TAKE 1 TABLET BY MOUTH EVERY DAY 11/13/20  Yes Jettie Booze, MD  losartan (COZAAR) 25 MG tablet Take 2 tablets by mouth daily.   Yes [provider]  Multiple Vitamin (MULTIVITAMIN WITH MINERALS) TABS tablet Take 1 tablet by mouth daily.   Yes [provider]  Nystatin (GERHARDT'S BUTT CREAM) CREA Apply 1 application topically 4 (four) times daily. Patient not taking: Reported on 04/22/2021 01/14/20   Lavina Hamman, MD  tamsulosin (FLOMAX) 0.4 MG CAPS capsule Take 1 capsule (0.4 mg total) by mouth daily. Patient not taking: Reported on 04/22/2021 01/14/20   Lavina Hamman, MD     Critical care time: n/a    Freda Jackson, MD [place] Office: [phone]   See Amion for personal pager PCCM on call pager [phone]  until 7pm. Please call Elink 7p-7a. [phone]

## 2021-04-23 NOTE — Plan of Care (Signed)
  Problem: Education: Goal: Knowledge of General Education information will improve Description: Including pain rating scale, medication(s)/side effects and non-pharmacologic comfort measures Outcome: Progressing   Problem: Nutrition: Goal: Adequate nutrition will be maintained Outcome: Progressing   Problem: Education: Goal: Knowledge of risk factors and measures for prevention of condition will improve Outcome: Progressing

## 2021-04-23 NOTE — Progress Notes (Signed)
Patient informed nurse he is feeling constipated. MD informed.

## 2021-04-23 NOTE — Progress Notes (Signed)
PROGRESS NOTE  Bruce Cohen LEX:[id number] DOB: [birth year] DOA: 04/22/2021 PCP: Josetta Huddle, MD   LOS: 0 days   Brief Narrative / Interim history: 80 year old male with history of A. fib on Eliquis, HTN, CAD, chronic combined CHF, questionable ILD comes into the hospital with shortness of breath.  Patient reports progressive shortness of breath over the last couple of months, he is okay at rest but can barely walk 10 to 15 feet and has to stop and gasp for air.  He was recently diagnosed with COVID on 10/4 and he finished treatment with Paxlovid.  Tells me his symptoms were very mild, had a little bit of congestion but now it is all resolved.  Subjective / 24h Interval events: He denies any chest pain, denies any shortness of breath at rest.  Has not been up and walking  Assessment & Plan: Principal Problem Dyspnea on exertion-appears to be quite profound as he is unable to do more than 10-15 steps without having to stop and gasp for air.  Obtain ambulatory sats to determine O2 levels with walking -There were initial concerns about him having symptomatic bradycardia and cardiology was consulted.  Discussed with Dr. Percival Spanish, this is very unlikely and he suspects a primary pulmonary process -He was seen for questionable ILD few years ago by pulmonology.  Obtain a high-resolution CT scan -2D echo pending  Active Problems Recent COVID-19 infection-has completed treatment with Paxlovid.  Still has mild inflammation -Today's last day of 10-day isolation, discontinue airborne precautions tomorrow -CT scan of the chest pending  Chronic combined CHF-BNP is slightly up but clinically he is not showing significant signs of fluid overload.  2D echo pending.  Appreciate cardiology follow-up  Paroxysmal A. fib-currently sinus bradycardia.  Cardiology recommends to continue amiodarone, awaiting CT scan of the chest.  If significant findings may need to come off of it.  He is anticoagulated with  Eliquis  Essential hypertension-continue losartan, furosemide  Acute kidney injury on chronic kidney disease stage IIIa-Baseline creatinine around 1.3.  He was 1.7 on admission, now improved to 1.4.  Resume Lasix, losartan, monitor  Hyperlipidemia-continue atorvastatin  Hypokalemia-continue to replete  Scheduled Meds:  [START ON 04/24/2021] amiodarone  100 mg Oral Daily   apixaban  5 mg Oral BID   atorvastatin  40 mg Oral QHS   cholecalciferol  1,000 Units Oral Daily   feeding supplement  237 mL Oral BID BM   furosemide  40 mg Oral Daily   losartan  50 mg Oral Daily   melatonin  3 mg Oral QHS   multivitamin with minerals  1 tablet Oral Daily   potassium chloride  40 mEq Oral Once   Continuous Infusions: PRN Meds:.albuterol  Diet Orders (From admission, onward)     Start     Ordered   04/22/21 1727  Diet Heart Room service appropriate? Yes; Fluid consistency: Thin  Diet effective now       Question Answer Comment  Room service appropriate? Yes   Fluid consistency: Thin      04/22/21 1726            DVT prophylaxis:  apixaban (ELIQUIS) tablet 5 mg     Code Status: Full Code  Family Communication: no family at bedside   Status is: Observation  The patient will require care spanning > 2 midnights and should be moved to inpatient because: Persistent symptoms, ongoing work-up  Level of care: Telemetry  Consultants:  Cardiology   Procedures:  2D echo: pending  Microbiology  None   Antimicrobials: none    Objective: Vitals:   04/22/21 1651 04/22/21 1726 04/22/21 2037 04/22/21 2349  BP: 136/67  126/65 124/64  Pulse: (!) 53  (!) 48 (!) 48  Resp: 20  16   Temp: 98.4 F (36.9 C)  97.8 F (36.6 C) 97.9 F (36.6 C)  TempSrc:   Oral Oral  SpO2: 100%  93% 97%  Weight:  76.8 kg    Height:  6\' 3"  (1.905 m)      Intake/Output Summary (Last 24 hours) at 04/23/2021 1016 Last data filed at 04/23/2021 0400 Gross per 24 hour  Intake 400 ml  Output 200 ml   Net 200 ml   Filed Weights   04/22/21 1726  Weight: 76.8 kg    Examination:  Constitutional: NAD Eyes: no scleral icterus ENMT: Mucous membranes are moist.  Neck: normal, supple Respiratory: Very faint bibasilar rhonchi, no wheezing, no crackles.  Normal respiratory effort Cardiovascular: Regular rate and rhythm, no murmurs / rubs / gallops. No LE edema.  Abdomen: non distended, no tenderness. Bowel sounds positive.  Musculoskeletal: no clubbing / cyanosis.  Skin: no rashes Neurologic: CN 2-12 grossly intact. Strength 5/5 in all 4.   Data Reviewed: I have independently reviewed following labs and imaging studies  CBC: Recent Labs  Lab 04/22/21 1154 04/23/21 0339  WBC 13.9* 8.3  NEUTROABS  --  5.4  HGB 12.0* 10.6*  HCT 36.4* 32.5*  MCV 98.4 98.2  PLT 504* 161   Basic Metabolic Panel: Recent Labs  Lab 04/22/21 1154 04/22/21 1714 04/23/21 0339  NA 140  --  138  K 3.1*  --  3.4*  CL 106  --  109  CO2 25  --  22  GLUCOSE 126*  --  91  BUN 23  --  22  CREATININE 1.76*  --  1.45*  CALCIUM 8.5*  --  8.1*  MG  --  2.1 2.2   Liver Function Tests: Recent Labs  Lab 04/23/21 0339  AST 30  ALT 18  ALKPHOS 68  BILITOT 0.6  PROT 6.7  ALBUMIN 2.7*   Coagulation Profile: No results for input(s): INR, PROTIME in the last 168 hours. HbA1C: No results for input(s): HGBA1C in the last 72 hours. CBG: No results for input(s): GLUCAP in the last 168 hours.  Recent Results (from the past 240 hour(s))  Resp Panel by RT-PCR (Flu A&B, Covid) Nasopharyngeal Swab     Status: Abnormal   Collection Time: 04/22/21  3:01 PM   Specimen: Nasopharyngeal Swab; Nasopharyngeal(NP) swabs in vial transport medium  Result Value Ref Range Status   SARS Coronavirus 2 by RT PCR POSITIVE (A) NEGATIVE Final    Comment: RESULT CALLED TO, READ BACK BY AND VERIFIED WITH:  @[id number]  BULLIN,H RN 04/22/21 EDENSCA (NOTE) SARS-CoV-2 target nucleic acids are DETECTED.  The SARS-CoV-2 RNA is  generally detectable in upper respiratory specimens during the acute phase of infection. Positive results are indicative of the presence of the identified virus, but do not rule out bacterial infection or co-infection with other pathogens not detected by the test. Clinical correlation with patient history and other diagnostic information is necessary to determine patient infection status. The expected result is Negative.  Fact Sheet for Patients: EntrepreneurPulse.com.au  Fact Sheet for Healthcare Providers: IncredibleEmployment.be  This test is not yet approved or cleared by the [place] FDA and  has been authorized for detection and/or diagnosis of SARS-CoV-2 by FDA under an Emergency Use Authorization (  EUA).  This EUA will remain in effect (meaning this test can  be used) for the duration of  the COVID-19 declaration under Section 564(b)(1) of the Act, 21 U.S.C. section 360bbb-3(b)(1), unless the authorization is terminated or revoked sooner.     Influenza A by PCR NEGATIVE NEGATIVE Final   Influenza B by PCR NEGATIVE NEGATIVE Final    Comment: (NOTE) The Xpert Xpress SARS-CoV-2/FLU/RSV plus assay is intended as an aid in the diagnosis of influenza from Nasopharyngeal swab specimens and should not be used as a sole basis for treatment. Nasal washings and aspirates are unacceptable for Xpert Xpress SARS-CoV-2/FLU/RSV testing.  Fact Sheet for Patients: EntrepreneurPulse.com.au  Fact Sheet for Healthcare Providers: IncredibleEmployment.be  This test is not yet approved or cleared by the [place] FDA and has been authorized for detection and/or diagnosis of SARS-CoV-2 by FDA under an Emergency Use Authorization (EUA). This EUA will remain in effect (meaning this test can be used) for the duration of the COVID-19 declaration under Section 564(b)(1) of the Act, 21 U.S.C. section 360bbb-3(b)(1),  unless the authorization is terminated or revoked.  Performed at [facility], [place] [address] [place], [place] [address]      Radiology Studies: US RENAL  Result Date: 04/22/2021 CLINICAL DATA:  Inpatient.  Acute kidney injury. EXAM: RENAL / URINARY TRACT ULTRASOUND COMPLETE COMPARISON:  01/09/2020 renal sonogram. FINDINGS: Right Kidney: Renal measurements: 10.5 x 4.4 x 4.9 cm = volume: 119 mL. Echogenicity within normal limits. No mass or hydronephrosis visualized. Left Kidney: Renal measurements: 10.7 x 4.4 x 5.6 cm = volume: 137 mL. Echogenicity within normal limits. No mass or hydronephrosis visualized. Bladder: Diffuse bladder wall thickening and trabeculation. Other: Mildly enlarged 4.3 x 4.1 x 4.6 cm prostate (prostate volume 42 cc). IMPRESSION: 1. Normal kidneys, with no hydronephrosis. 2. Diffuse bladder wall thickening and trabeculation, presumably due to chronic bladder outlet obstruction by the enlarged prostate. Electronically Signed   By: Ilona Sorrel M.D.   On: 04/22/2021 19:46   DG Chest Port 1 View  Result Date: 04/22/2021 CLINICAL DATA:  Shortness of breath EXAM: PORTABLE CHEST 1 VIEW COMPARISON:  01/09/2020 FINDINGS: Chronic interstitial lung disease. No new consolidation. No pleural effusion. Stable mild cardiomegaly. IMPRESSION: Chronic interstitial lung disease.  No definite acute abnormality. Electronically Signed   By: Macy Mis M.D.   On: 04/22/2021 12:49    Marzetta Board, MD, PhD [facility]  Between 7 am - 7 pm I am available, please contact me via Amion (for emergencies) or Securechat (non urgent messages)  Between 7 pm - 7 am I am not available, please contact night coverage MD/APP via Amion

## 2021-04-24 ENCOUNTER — Telehealth: Payer: Self-pay | Admitting: Pulmonary Disease

## 2021-04-24 DIAGNOSIS — R0602 Shortness of breath: Secondary | ICD-10-CM | POA: Diagnosis not present

## 2021-04-24 DIAGNOSIS — R531 Weakness: Secondary | ICD-10-CM | POA: Diagnosis not present

## 2021-04-24 DIAGNOSIS — I480 Paroxysmal atrial fibrillation: Secondary | ICD-10-CM

## 2021-04-24 DIAGNOSIS — I509 Heart failure, unspecified: Secondary | ICD-10-CM

## 2021-04-24 DIAGNOSIS — J84112 Idiopathic pulmonary fibrosis: Secondary | ICD-10-CM | POA: Diagnosis not present

## 2021-04-24 LAB — COMPREHENSIVE METABOLIC PANEL
ALT: 19 U/L (ref 0–44)
AST: 32 U/L (ref 15–41)
Albumin: 2.6 g/dL — ABNORMAL LOW (ref 3.5–5.0)
Alkaline Phosphatase: 71 U/L (ref 38–126)
Anion gap: 8 (ref 5–15)
BUN: 20 mg/dL (ref 8–23)
CO2: 24 mmol/L (ref 22–32)
Calcium: 8.3 mg/dL — ABNORMAL LOW (ref 8.9–10.3)
Chloride: 108 mmol/L (ref 98–111)
Creatinine, Ser: 1.48 mg/dL — ABNORMAL HIGH (ref 0.61–1.24)
GFR, Estimated: 48 mL/min — ABNORMAL LOW (ref >60)
Glucose, Bld: 93 mg/dL (ref 70–99)
Potassium: 4 mmol/L (ref 3.5–5.1)
Sodium: 140 mmol/L (ref 135–145)
Total Bilirubin: 0.3 mg/dL (ref 0.3–1.2)
Total Protein: 6.7 g/dL (ref 6.5–8.1)

## 2021-04-24 LAB — CBC WITH DIFFERENTIAL/PLATELET
Abs Immature Granulocytes: 0.11 10*3/uL — ABNORMAL HIGH (ref 0.00–0.07)
Basophils Absolute: 0.1 10*3/uL (ref 0.0–0.1)
Basophils Relative: 1 %
Eosinophils Absolute: 0.2 10*3/uL (ref 0.0–0.5)
Eosinophils Relative: 3 %
HCT: 33 % — ABNORMAL LOW (ref 39.0–52.0)
Hemoglobin: 10.8 g/dL — ABNORMAL LOW (ref 13.0–17.0)
Immature Granulocytes: 1 %
Lymphocytes Relative: 23 %
Lymphs Abs: 1.8 10*3/uL (ref 0.7–4.0)
MCH: 32.4 pg (ref 26.0–34.0)
MCHC: 32.7 g/dL (ref 30.0–36.0)
MCV: 99.1 fL (ref 80.0–100.0)
Monocytes Absolute: 0.7 10*3/uL (ref 0.1–1.0)
Monocytes Relative: 8 %
Neutro Abs: 5.3 10*3/uL (ref 1.7–7.7)
Neutrophils Relative %: 64 %
Platelets: 346 10*3/uL (ref 150–400)
RBC: 3.33 MIL/uL — ABNORMAL LOW (ref 4.22–5.81)
RDW: 14 % (ref 11.5–15.5)
WBC: 8.1 10*3/uL (ref 4.0–10.5)
nRBC: 0 % (ref 0.0–0.2)

## 2021-04-24 LAB — D-DIMER, QUANTITATIVE: D-Dimer, Quant: 0.32 ug/mL-FEU (ref 0.00–0.50)

## 2021-04-24 LAB — MAGNESIUM: Magnesium: 2.1 mg/dL (ref 1.7–2.4)

## 2021-04-24 LAB — RHEUMATOID FACTOR: Rheumatoid fact SerPl-aCnc: <10 IU/mL (ref <14.0)

## 2021-04-24 LAB — C-REACTIVE PROTEIN: CRP: 4.2 mg/dL — ABNORMAL HIGH (ref <1.0)

## 2021-04-24 LAB — FERRITIN: Ferritin: 223 ng/mL (ref 24–336)

## 2021-04-24 NOTE — Progress Notes (Signed)
SATURATION QUALIFICATIONS: (This note is used to comply with regulatory documentation for home oxygen)  Patient Saturations on Room Air at Rest = 99%  Patient Saturations on Room Air while Ambulating = NT due to de-sat with nurse earlier on room air  Patient Saturations on 3 Liters of oxygen while Ambulating = 85% On 2 liters went to 81%  Please briefly explain why patient needs home oxygen: pt de-sat with mobility

## 2021-04-24 NOTE — Discharge Summary (Signed)
Physician Discharge Summary  Bruce Cohen BSJ:[id number] DOB: 17-Feb-1941 DOA: 04/22/2021  PCP: Josetta Huddle, MD  Admit date: 04/22/2021 Discharge date: 04/24/2021  Admitted From: home Disposition:  home  Recommendations for Outpatient Follow-up:  Follow up with PCP in 1-2 weeks Follow-up with pulmonology  Home Health: PT Equipment/Devices: home O2  Discharge Condition: stable CODE STATUS: Full code Diet recommendation: regular  HPI: Per admitting MD, Bruce Cohen is a 80 y.o. male with medical history significant of systolic HF, a fib on eliquis, HTN, CAD. Presenting with dyspnea. He's had dyspnea for the last month. Initially, he thought is was because of a fib, so he largely ignored it. However, as a couple of weeks went by, he noticed that he was developing congestion and fevers. He saw his PCP and on 04/13/21; he was Dx'd with COVID. He was given paxlovid and levaquin. He completed that therapy 2 days ago. While his congestion and fever have improved, he dyspnea has not. He says he is ok at rest. He says his symptoms are present when he tries to move. He can only get 15 or so steps before he is gasping for air. He finds himself dizzy and lightheaded when he tries to get around. He feels as if he is going to pass out. He had not noticed any weight gain or edema during this time. He has not had any CP or palpitations. He has not noticed any dark stools or hematemesis. When his symptoms did not improved today; he decided to come to the ED for help. He denies any other aggravating or alleviating factors.    Hospital Course / Discharge diagnoses: Principal Problem Pulmonary fibrosis causing dyspnea on exertion-his dyspnea has been somewhat chronic in nature for several months, progressive, he underwent a high-resolution CT scan which showed pulmonary fibrosis.  Pulmonology consulted, sent autoimmune work-up which is pending at the time of discharge and recommending follow-up as an  outpatient for evaluation for antifibrotic's.  Did not recommend steroids at this time.  He qualifies for oxygen, requiring 3 L with ambulation.  Cardiology was also consulted due to concern for bradycardia being the cause for his dyspnea however he did not feel that that this the case especially given CT scan findings.  He underwent a 2D echo which showed an EF of 50-55%, low normal LVEF, grade 2 diastolic dysfunction.    Active Problems Recent COVID-19 infection-has completed treatment with Paxlovid.  No further symptoms, finished 10 days of isolation. Chronic combined CHF-BNP is slightly up but clinically he is not showing significant signs of fluid overload.  Paroxysmal A. fib-currently sinus bradycardia.  Cardiology recommends to continue amiodarone, as it is felt that the pulmonary fibrosis findings on the CT scan 2017 may have preceded amiodarone treatment.  Continue Eliquis for anticoagulation  Essential hypertension-continue losartan, furosemide Acute kidney injury on chronic kidney disease stage IIIa-Baseline creatinine around 1.3.  He was 1.7 on admission, now improved to 1.4.  Resume home medications on discharge  Hyperlipidemia-continue atorvastatin Hypokalemia-repleted, normalized  Sepsis ruled out   Discharge Instructions   Allergies as of 04/24/2021   No Known Allergies      Medication List     TAKE these medications    amiodarone 200 MG tablet Commonly known as: PACERONE TAKE 1 TABLET EVERY DAY AND 1/2 TABLET ON SATURDAY AND SUNDAY   apixaban 5 MG Tabs tablet Commonly known as: ELIQUIS Take 1 tablet (5 mg total) by mouth 2 (two) times daily.   atorvastatin  40 MG tablet Commonly known as: LIPITOR Take 1 tablet by mouth at bedtime.   cholecalciferol 25 MCG (1000 UNIT) tablet Commonly known as: VITAMIN D3 Take 1,000 Units by mouth daily.   furosemide 40 MG tablet Commonly known as: LASIX TAKE 1 TABLET BY MOUTH EVERY DAY   Gerhardt's butt cream Crea Apply  1 application topically 4 (four) times daily.   losartan 25 MG tablet Commonly known as: COZAAR Take 2 tablets by mouth daily.   multivitamin with minerals Tabs tablet Take 1 tablet by mouth daily.   tamsulosin 0.4 MG Caps capsule Commonly known as: FLOMAX Take 1 capsule (0.4 mg total) by mouth daily.               Durable Medical Equipment  (From admission, onward)           Start     Ordered   04/24/21 0948  For home use only DME oxygen  Once       Question Answer Comment  Length of Need Lifetime   Mode or (Route) Nasal cannula   Liters per Minute 3   Oxygen conserving device Yes   Oxygen delivery system Gas      04/24/21 0950             Consultations: Cardiology  Pulmonary   Procedures/Studies:  US RENAL  Result Date: 30-Apr-2021 CLINICAL DATA:  Inpatient.  Acute kidney injury. EXAM: RENAL / URINARY TRACT ULTRASOUND COMPLETE COMPARISON:  01/09/2020 renal sonogram. FINDINGS: Right Kidney: Renal measurements: 10.5 x 4.4 x 4.9 cm = volume: 119 mL. Echogenicity within normal limits. No mass or hydronephrosis visualized. Left Kidney: Renal measurements: 10.7 x 4.4 x 5.6 cm = volume: 137 mL. Echogenicity within normal limits. No mass or hydronephrosis visualized. Bladder: Diffuse bladder wall thickening and trabeculation. Other: Mildly enlarged 4.3 x 4.1 x 4.6 cm prostate (prostate volume 42 cc). IMPRESSION: 1. Normal kidneys, with no hydronephrosis. 2. Diffuse bladder wall thickening and trabeculation, presumably due to chronic bladder outlet obstruction by the enlarged prostate. Electronically Signed   By: Ilona Sorrel M.D.   On: 2021/04/30 19:46   CT Chest High Resolution  Result Date: 04/23/2021 CLINICAL DATA:  Interstitial lung disease EXAM: CT CHEST WITHOUT CONTRAST TECHNIQUE: Multidetector CT imaging of the chest was performed following the standard protocol without intravenous contrast. High resolution imaging of the lungs, as well as inspiratory and  expiratory imaging, was performed. COMPARISON:  03/20/2017, 03/17/2016 FINDINGS: Cardiovascular: Aortic atherosclerosis. Normal heart size. Left and right coronary artery calcifications. No pericardial effusion. Mediastinum/Nodes: Prominent mediastinal and hilar lymph nodes, unchanged compared to prior examination. Thyroid gland, trachea, and esophagus demonstrate no significant findings. Lungs/Pleura: There is moderate pulmonary fibrosis in a pattern with apical to basal gradient, featuring irregular peripheral interstitial opacity, interlobular septal thickening, subtle traction bronchiectasis, and small areas of subpleural bronchiolectasis. Fibrotic findings are significantly worsened compared to prior examination dated 2017. No significant air trapping on expiratory phase imaging. No pleural effusion or pneumothorax. Upper Abdomen: No acute abnormality. Musculoskeletal: No chest wall mass or suspicious bone lesions identified. IMPRESSION: 1. There is moderate pulmonary fibrosis in a pattern with apical to basal gradient, featuring irregular peripheral interstitial opacity, interlobular septal thickening, subtle traction bronchiectasis, and small areas of subpleural bronchiolectasis. Fibrotic findings are significantly worsened compared to prior examination dated 2017. Findings are categorized as probable UIP per consensus guidelines: Diagnosis of Idiopathic Pulmonary Fibrosis: An Official ATS/ERS/JRS/ALAT Clinical Practice Guideline. [place], Iss 5,  [id number], Mar 11 2017. 2. No acute airspace opacity. 3. Coronary artery disease. Aortic Atherosclerosis (ICD10-I70.0). Electronically Signed   By: Delanna Ahmadi M.D.   On: 04/23/2021 12:23   DG Chest Port 1 View  Result Date: 04/22/2021 CLINICAL DATA:  Shortness of breath EXAM: PORTABLE CHEST 1 VIEW COMPARISON:  01/09/2020 FINDINGS: Chronic interstitial lung disease. No new consolidation. No pleural effusion. Stable mild cardiomegaly.  IMPRESSION: Chronic interstitial lung disease.  No definite acute abnormality. Electronically Signed   By: Macy Mis M.D.   On: 04/22/2021 12:49   ECHOCARDIOGRAM COMPLETE  Result Date: 04/23/2021    ECHOCARDIOGRAM REPORT   Patient Name:   JOHNATTAN STRASSMAN Date of Exam: 04/23/2021 Medical Rec #:  [id number]        Height:       75.0 in Accession #:    [id number]       Weight:       169.2 lb Date of Birth:  04-May-1941        BSA:          2.044 m Patient Age:    1 years         BP:           114/70 mmHg Patient Gender: M                HR:           49 bpm. Exam Location:  Inpatient Procedure: 2D Echo, Color Doppler and Cardiac Doppler Indications:     CHF  History:         Patient has no prior history of Echocardiogram examinations.                  Risk Factors:Family History of Coronary Artery Disease,                  Hypertension and Dyslipidemia.  Sonographer:     Helmut Muster Referring Phys:  [id number] Jonnie Finner Diagnosing Phys: Sanda Klein MD IMPRESSIONS  1. Left ventricular ejection fraction, by estimation, is 50 to 55%. The left ventricle has low normal function. The left ventricle demonstrates regional wall motion abnormalities (see scoring diagram/findings for description). There is moderate concentric left ventricular hypertrophy. Left ventricular diastolic parameters are consistent with Grade II diastolic dysfunction (pseudonormalization). Elevated left atrial pressure. There is mild hypokinesis of the left ventricular, apical segment.  2. Right ventricular systolic function is normal. The right ventricular size is normal.  3. Left atrial size was severely dilated.  4. Right atrial size was severely dilated.  5. The mitral valve is normal in structure. Trivial mitral valve regurgitation.  6. The aortic valve is tricuspid. Aortic valve regurgitation is not visualized. No aortic stenosis is present. FINDINGS  Left Ventricle: Left ventricular ejection fraction, by estimation, is 50 to 55%.  The left ventricle has low normal function. The left ventricle demonstrates regional wall motion abnormalities. The left ventricular internal cavity size was normal in size. There is moderate concentric left ventricular hypertrophy. Left ventricular diastolic parameters are consistent with Grade II diastolic dysfunction (pseudonormalization). Elevated left atrial pressure. Right Ventricle: The right ventricular size is normal. No increase in right ventricular wall thickness. Right ventricular systolic function is normal. Left Atrium: Left atrial size was severely dilated. Right Atrium: Right atrial size was severely dilated. Pericardium: There is no evidence of pericardial effusion. Mitral Valve: The mitral valve is normal in structure. Mild mitral annular calcification. Trivial mitral valve regurgitation. Tricuspid Valve: The  tricuspid valve is normal in structure. Tricuspid valve regurgitation is mild. Aortic Valve: The aortic valve is tricuspid. Aortic valve regurgitation is not visualized. No aortic stenosis is present. Pulmonic Valve: The pulmonic valve was grossly normal. Pulmonic valve regurgitation is not visualized. Aorta: The aortic root and ascending aorta are structurally normal, with no evidence of dilitation. IAS/Shunts: No atrial level shunt detected by color flow Doppler.  LEFT VENTRICLE PLAX 2D LVIDd:         5.20 cm     Diastology LVIDs:         4.20 cm     LV e' medial:   3.78 cm/s LV PW:         1.40 cm     LV E/e' medial: 16.8 LV IVS:        1.40 cm LVOT diam:     2.40 cm LV SV:         76 LV SV Index:   37 LVOT Area:     4.52 cm  LV Volumes (MOD) LV vol d, MOD A2C: 92.2 ml LV vol d, MOD A4C: 76.5 ml LV vol s, MOD A2C: 46.0 ml LV vol s, MOD A4C: 38.6 ml LV SV MOD A2C:     46.2 ml LV SV MOD A4C:     76.5 ml LV SV MOD BP:      41.3 ml LEFT ATRIUM              Index        RIGHT ATRIUM           Index LA diam:        3.90 cm  1.91 cm/m   RA Area:     27.80 cm LA Vol (A2C):   123.0 ml 60.17 ml/m   RA Volume:   87.60 ml  42.86 ml/m LA Vol (A4C):   99.4 ml  48.63 ml/m LA Biplane Vol: 112.0 ml 54.79 ml/m  AORTIC VALVE LVOT Vmax:   94.60 cm/s LVOT Vmean:  46.200 cm/s LVOT VTI:    0.167 m  AORTA Ao Root diam: 3.80 cm Ao Asc diam:  3.70 cm MITRAL VALVE               TRICUSPID VALVE MV Area (PHT): 1.44 cm    TR Peak grad:   30.7 mmHg MV Decel Time: 525 msec    TR Vmax:        277.00 cm/s MV E velocity: 63.40 cm/s MV A velocity: 40.70 cm/s  SHUNTS MV E/A ratio:  1.56        Systemic VTI:  0.17 m                            Systemic Diam: 2.40 cm Mihai Croitoru MD Electronically signed by Sanda Klein MD Signature Date/Time: 04/23/2021/7:42:52 PM    Final      Subjective: - no chest pain, shortness of breath at rest, no abdominal pain, nausea or vomiting.   Discharge Exam: BP (!) 148/71 (BP Location: Right Arm)   Pulse (!) 50   Temp 98.1 F (36.7 C) (Oral)   Resp 17   Ht 6\' 3"  (1.905 m)   Wt 76.8 kg   SpO2 100%   BMI 21.15 kg/m   General: Pt is alert, awake, not in acute distress Cardiovascular: RRR, S1/S2 +, no rubs, no gallops Respiratory: CTA bilaterally, no wheezing, no rhonchi Abdominal: Soft, NT, ND, bowel sounds +  Extremities: no edema, no cyanosis    The results of significant diagnostics from this hospitalization (including imaging, microbiology, ancillary and laboratory) are listed below for reference.     Microbiology: Recent Results (from the past 240 hour(s))  Resp Panel by RT-PCR (Flu A&B, Covid) Nasopharyngeal Swab     Status: Abnormal   Collection Time: 04/22/21  3:01 PM   Specimen: Nasopharyngeal Swab; Nasopharyngeal(NP) swabs in vial transport medium  Result Value Ref Range Status   SARS Coronavirus 2 by RT PCR POSITIVE (A) NEGATIVE Final    Comment: RESULT CALLED TO, READ BACK BY AND VERIFIED WITH:  @[id number]  BULLIN,H RN 04/22/21 EDENSCA (NOTE) SARS-CoV-2 target nucleic acids are DETECTED.  The SARS-CoV-2 RNA is generally detectable in upper  respiratory specimens during the acute phase of infection. Positive results are indicative of the presence of the identified virus, but do not rule out bacterial infection or co-infection with other pathogens not detected by the test. Clinical correlation with patient history and other diagnostic information is necessary to determine patient infection status. The expected result is Negative.  Fact Sheet for Patients: EntrepreneurPulse.com.au  Fact Sheet for Healthcare Providers: IncredibleEmployment.be  This test is not yet approved or cleared by the [place] FDA and  has been authorized for detection and/or diagnosis of SARS-CoV-2 by FDA under an Emergency Use Authorization (EUA).  This EUA will remain in effect (meaning this test can  be used) for the duration of  the COVID-19 declaration under Section 564(b)(1) of the Act, 21 U.S.C. section 360bbb-3(b)(1), unless the authorization is terminated or revoked sooner.     Influenza A by PCR NEGATIVE NEGATIVE Final   Influenza B by PCR NEGATIVE NEGATIVE Final    Comment: (NOTE) The Xpert Xpress SARS-CoV-2/FLU/RSV plus assay is intended as an aid in the diagnosis of influenza from Nasopharyngeal swab specimens and should not be used as a sole basis for treatment. Nasal washings and aspirates are unacceptable for Xpert Xpress SARS-CoV-2/FLU/RSV testing.  Fact Sheet for Patients: EntrepreneurPulse.com.au  Fact Sheet for Healthcare Providers: IncredibleEmployment.be  This test is not yet approved or cleared by the [place] FDA and has been authorized for detection and/or diagnosis of SARS-CoV-2 by FDA under an Emergency Use Authorization (EUA). This EUA will remain in effect (meaning this test can be used) for the duration of the COVID-19 declaration under Section 564(b)(1) of the Act, 21 U.S.C. section 360bbb-3(b)(1), unless the authorization is  terminated or revoked.  Performed at [facility], [place] [address], [place], [place] [address]      Labs: Basic Metabolic Panel: Recent Labs  Lab 04/22/21 1154 04/22/21 1714 04/23/21 0339 04/24/21 0352  NA 140  --  138 140  K 3.1*  --  3.4* 4.0  CL 106  --  109 108  CO2 25  --  22 24  GLUCOSE 126*  --  91 93  BUN 23  --  22 20  CREATININE 1.76*  --  1.45* 1.48*  CALCIUM 8.5*  --  8.1* 8.3*  MG  --  2.1 2.2 2.1   Liver Function Tests: Recent Labs  Lab 04/23/21 0339 04/24/21 0352  AST 30 32  ALT 18 19  ALKPHOS 68 71  BILITOT 0.6 0.3  PROT 6.7 6.7  ALBUMIN 2.7* 2.6*   CBC: Recent Labs  Lab 04/22/21 1154 04/23/21 0339 04/24/21 0352  WBC 13.9* 8.3 8.1  NEUTROABS  --  5.4 5.3  HGB 12.0* 10.6* 10.8*  HCT 36.4* 32.5* 33.0*  MCV 98.4 98.2 99.1  PLT 504* 345 346   CBG: No results for input(s): GLUCAP in the last 168 hours. Hgb A1c No results for input(s): HGBA1C in the last 72 hours. Lipid Profile No results for input(s): CHOL, HDL, LDLCALC, TRIG, CHOLHDL, LDLDIRECT in the last 72 hours. Thyroid function studies Recent Labs    04/22/21 1754  TSH 1.593   Urinalysis    Component Value Date/Time   COLORURINE AMBER (A) 04/22/2021 1607   APPEARANCEUR HAZY (A) 04/22/2021 1607   LABSPEC 1.017 04/22/2021 1607   PHURINE 5.0 04/22/2021 1607   GLUCOSEU NEGATIVE 04/22/2021 1607   HGBUR NEGATIVE 04/22/2021 1607   BILIRUBINUR NEGATIVE 04/22/2021 1607   KETONESUR NEGATIVE 04/22/2021 1607   PROTEINUR NEGATIVE 04/22/2021 1607   NITRITE NEGATIVE 04/22/2021 1607   LEUKOCYTESUR NEGATIVE 04/22/2021 1607    FURTHER DISCHARGE INSTRUCTIONS:   Get Medicines reviewed and adjusted: Please take all your medications with you for your next visit with your Primary MD   Laboratory/radiological data: Please request your Primary MD to go over all hospital tests and procedure/radiological results at the follow up, please ask your Primary MD to get all  Hospital records sent to his/her office.   In some cases, they will be blood work, cultures and biopsy results pending at the time of your discharge. Please request that your primary care M.D. goes through all the records of your hospital data and follows up on these results.   Also Note the following: If you experience worsening of your admission symptoms, develop shortness of breath, life threatening emergency, suicidal or homicidal thoughts you must seek medical attention immediately by calling 911 or calling your MD immediately  if symptoms less severe.   You must read complete instructions/literature along with all the possible adverse reactions/side effects for all the Medicines you take and that have been prescribed to you. Take any new Medicines after you have completely understood and accpet all the possible adverse reactions/side effects.    Do not drive when taking Pain medications or sleeping medications (Benzodaizepines)   Do not take more than prescribed Pain, Sleep and Anxiety Medications. It is not advisable to combine anxiety,sleep and pain medications without talking with your primary care practitioner   Special Instructions: If you have smoked or chewed Tobacco  in the last 2 yrs please stop smoking, stop any regular Alcohol  and or any Recreational drug use.   Wear Seat belts while driving.   Please note: You were cared for by a hospitalist during your hospital stay. Once you are discharged, your primary care physician will handle any further medical issues. Please note that NO REFILLS for any discharge medications will be authorized once you are discharged, as it is imperative that you return to your primary care physician (or establish a relationship with a primary care physician if you do not have one) for your post hospital discharge needs so that they can reassess your need for medications and monitor your lab values.  Time coordinating discharge: 40  minutes  SIGNED:  Marzetta Board, MD, PhD 04/24/2021, 9:51 AM

## 2021-04-24 NOTE — Consult Note (Signed)
NAME:  Bruce Cohen, MRN:  [id number], DOB:  [birth year], LOS: 0 ADMISSION DATE:  04/22/2021, CONSULTATION DATE:  04/23/2021 REFERRING MD:  Dr. Cruzita Lederer, CHIEF COMPLAINT:  SOB   History of Present Illness:  80 year old male with prior history of former smoker, untreated OSA, Afib on Eliquis, HTN, CAD, non-ischemic cardiomyopathy, chronic pericarditis, and CKD IIIa who presented with two month history of progressive dyspnea.  He ignored it as he thought it was related to his Afib.  He then developed fever and congestion and diagnosed with COVID on 10/4, treated with Paxlovid and levaquin, in which his fevers and congestion resolved but his dyspnea did not.  Dyspnea is exertional and he reports he gasp for air if taking more than a few steps, and becoming dizzy and light-headed, thus he presented to the ER.    No weight gain, edema, hemoptysis, chest pain, palpitations, dark stools or hematemesis.  Last seen in our office on 03/08/2016 by Dr. Lamonte Sakai.  During that time he did not want to wear CPAP for his OSA and PFTs were reassuring given his smoking history for COPD.  Some concern for ILD dating back to chest CT in 09/2014 with scattered infiltrates but were in the setting of acute illness while being treated for a pneumonia and heart failure.  He was started on amiodarone 12/2015, but has CXR changes of mild diffuse interstitial prominence that predates starting amiodarone.  Chest CT was repeated on 03/2016 which showed resolved areas of consolidation, interlobular septal thickening and subpleural reticulation throughout both lungs, but favored to represent mild pulmonary edema, decreased mild mediastinal lymphadenopathy c/w benign reactive adenopathy, however NSIP could not be excluded.  He was then lost in pulmonary follow-up.  Workup with BNP 582, CXR non acute, and noted to be bradycardic in Afib in the 40's.  Noted that at rest, oxygen saturations are good, but has dipped down in the 80's with  exertion.  Some concern that exertional dyspnea was related to bradycardia, but Cardiology did not feel that it was related.  Given his prior history, an HRCT was repeated which showed no acute airspace opacities but did show moderate pulmonary fibrosis in apical to basal gradient pattern, consistent with probable UIP, thus pulmonary was consulted for further recommendations.   Pertinent  Medical History  Former smoker (40+ pack years), OSA, Afib on Eliquis, HTN, CAD, non-ischemic cardiomyopathy, chronic pericarditis, CKD IIIa  Significant Hospital Events: Including procedures, antibiotic start and stop dates in addition to other pertinent events   10/13 admitted [facility]   10/14 HRCT >> 1. There is moderate pulmonary fibrosis in a pattern with apical to basal gradient, featuring irregular peripheral interstitial opacity, interlobular septal thickening, subtle traction bronchiectasis, and small areas of subpleural bronchiolectasis. Fibrotic findings are significantly worsened compared to prior examination dated 2017. Findings are categorized as probable UIP per consensus guidelines. 2. No acute airspace opacity. 3. Coronary artery disease.  Interim History / Subjective:   No acute events overnight. He qualified for 3L supplemental O2 with exertion.  Objective   Blood pressure (!) 172/94, pulse 100, temperature (!) 97.5 F (36.4 C), temperature source Oral, resp. rate 16, height 6\' 3"  (1.905 m), weight 76.8 kg, SpO2 100 %.        Intake/Output Summary (Last 24 hours) at 04/24/2021 1413 Last data filed at 04/24/2021 1411 Gross per 24 hour  Intake 480 ml  Output 750 ml  Net -270 ml    Filed Weights   04/22/21 1726  Weight: 76.8 kg   Examination: General:  elderly male, no acute distress HEENT: MM pink/moist, [place]/AT, PERRL Neuro: alert, oriented, moving all extremities CV: s1s2 rrr, no m/r/g PULM:  scattered dry crackles posteriorly. No wheezing. GI: soft, bsx4 active  Extremities:  warm/dry, no edema  Skin: no rashes or lesions   Resolved Hospital Problem list    Assessment & Plan:   Pulmonary Fibrosis ILD with HRCT most consistent with UIP pattern  - on amiodarone since 12/2015 but did have CXR changes that predates starting amiodarone P:  - he has qualified for home O2, 3L on ambulation - he is to use 2-3L of supplemental O2 when sleeping - HSP, autoimmune panel ordered - no role for steroids at this time - will need outpatient follow up in our office for  PFTs and discussion of possible antifibrotics (nintedanib or pirfenidone)  - He has been provided with ILD questionnaire to fill out at home and bring back to the office appointment. - Will benefit from outpatient pulmonary rehab in the future    Remainder per primary team.    Recent COVID- resolved, due to come off precautions 10/15 ICM- cardiology following, pending TTE PAF on Eliquis- ongoing sinus bradycardia HTN AoCKD IIIa- improving HLD  Labs   CBC: Recent Labs  Lab 04/22/21 1154 04/23/21 0339 04/24/21 0352  WBC 13.9* 8.3 8.1  NEUTROABS  --  5.4 5.3  HGB 12.0* 10.6* 10.8*  HCT 36.4* 32.5* 33.0*  MCV 98.4 98.2 99.1  PLT 504* 345 346     Basic Metabolic Panel: Recent Labs  Lab 04/22/21 1154 04/22/21 1714 04/23/21 0339 04/24/21 0352  NA 140  --  138 140  K 3.1*  --  3.4* 4.0  CL 106  --  109 108  CO2 25  --  22 24  GLUCOSE 126*  --  91 93  BUN 23  --  22 20  CREATININE 1.76*  --  1.45* 1.48*  CALCIUM 8.5*  --  8.1* 8.3*  MG  --  2.1 2.2 2.1    GFR: Estimated Creatinine Clearance: 43.2 mL/min (A) (by C-G formula based on SCr of 1.48 mg/dL (H)). Recent Labs  Lab 04/22/21 1154 04/23/21 0339 04/24/21 0352  WBC 13.9* 8.3 8.1     Liver Function Tests: Recent Labs  Lab 04/23/21 0339 04/24/21 0352  AST 30 32  ALT 18 19  ALKPHOS 68 71  BILITOT 0.6 0.3  PROT 6.7 6.7  ALBUMIN 2.7* 2.6*    No results for input(s): LIPASE, AMYLASE in the last 168 hours. No  results for input(s): AMMONIA in the last 168 hours.  ABG No results found for: PHART, PCO2ART, PO2ART, HCO3, TCO2, ACIDBASEDEF, O2SAT   Coagulation Profile: No results for input(s): INR, PROTIME in the last 168 hours.  Cardiac Enzymes: Recent Labs  Lab 04/23/21 1543  CKTOTAL 31*    HbA1C: No results found for: HGBA1C  CBG: No results for input(s): GLUCAP in the last 168 hours.  Critical care time: n/a    Freda Jackson, MD [facility] Office: [phone]   See Amion for personal pager PCCM on call pager [phone] until 7pm. Please call Elink 7p-7a. [phone]

## 2021-04-24 NOTE — Progress Notes (Signed)
Progress Note  Patient Name: Bruce Cohen Date of Encounter: 04/24/2021  Primary Cardiologist: Larae Grooms, MD   Subjective   Notes hearing difficultly.  No palpitations, CP, or SOB:  when in Afib sx are related to SOB.  Inpatient Medications    Scheduled Meds:  amiodarone  100 mg Oral Daily   apixaban  5 mg Oral BID   atorvastatin  40 mg Oral QHS   cholecalciferol  1,000 Units Oral Daily   feeding supplement  237 mL Oral BID BM   furosemide  40 mg Oral Daily   losartan  50 mg Oral Daily   melatonin  3 mg Oral QHS   multivitamin with minerals  1 tablet Oral Daily   Continuous Infusions:  PRN Meds: albuterol, polyethylene glycol   Vital Signs    Vitals:   04/23/21 1307 04/23/21 2021 04/24/21 0615 04/24/21 0700  BP: (!) 143/66 128/67 (!) 148/71   Pulse: (!) 51 (!) 54 (!) 50   Resp: 18 20 18 17   Temp: 97.8 F (36.6 C) 97.9 F (36.6 C) 98.1 F (36.7 C)   TempSrc: Oral Oral Oral   SpO2: 97% 100% 97% 100%  Weight:      Height:        Intake/Output Summary (Last 24 hours) at 04/24/2021 0747 Last data filed at 04/24/2021 0600 Gross per 24 hour  Intake 360 ml  Output 450 ml  Net -90 ml   Filed Weights   04/22/21 1726  Weight: 76.8 kg    Telemetry    Sinus bradycardia to sinus tachycardia rare PVCs - Personally Reviewed  ECG    SR 66 RBBB Jtc 441ms - Personally Reviewed  Physical Exam   GEN: No acute distress.   Neck: No JVD Cardiac: RRR, no murmurs, rubs, or gallops.  Respiratory: good air movement, decreased breaths sounds with crackles in the UL sounds GI: Soft, nontender, non-distended  MS: No edema; No deformity. Neuro:  Nonfocal  Psych: Normal affect   Labs    Chemistry Recent Labs  Lab 04/22/21 1154 04/23/21 0339 04/24/21 0352  NA 140 138 140  K 3.1* 3.4* 4.0  CL 106 109 108  CO2 25 22 24   GLUCOSE 126* 91 93  BUN 23 22 20   CREATININE 1.76* 1.45* 1.48*  CALCIUM 8.5* 8.1* 8.3*  PROT  --  6.7 6.7  ALBUMIN  --  2.7*  2.6*  AST  --  30 32  ALT  --  18 19  ALKPHOS  --  68 71  BILITOT  --  0.6 0.3  GFRNONAA 39* 49* 48*  ANIONGAP 9 7 8      Hematology Recent Labs  Lab 04/22/21 1154 04/23/21 0339 04/24/21 0352  WBC 13.9* 8.3 8.1  RBC 3.70* 3.31* 3.33*  HGB 12.0* 10.6* 10.8*  HCT 36.4* 32.5* 33.0*  MCV 98.4 98.2 99.1  MCH 32.4 32.0 32.4  MCHC 33.0 32.6 32.7  RDW 13.7 13.8 14.0  PLT 504* 345 346    Cardiac EnzymesNo results for input(s): TROPONINI in the last 168 hours. No results for input(s): TROPIPOC in the last 168 hours.   BNP Recent Labs  Lab 04/22/21 1154  BNP 582.4*     DDimer  Recent Labs  Lab 04/22/21 1713 04/23/21 0339 04/24/21 0352  DDIMER 0.43 0.30 0.32     Radiology    US RENAL  Result Date: 04/22/2021 CLINICAL DATA:  Inpatient.  Acute kidney injury. EXAM: RENAL / URINARY TRACT ULTRASOUND COMPLETE COMPARISON:  01/09/2020 renal sonogram. FINDINGS: Right Kidney: Renal measurements: 10.5 x 4.4 x 4.9 cm = volume: 119 mL. Echogenicity within normal limits. No mass or hydronephrosis visualized. Left Kidney: Renal measurements: 10.7 x 4.4 x 5.6 cm = volume: 137 mL. Echogenicity within normal limits. No mass or hydronephrosis visualized. Bladder: Diffuse bladder wall thickening and trabeculation. Other: Mildly enlarged 4.3 x 4.1 x 4.6 cm prostate (prostate volume 42 cc). IMPRESSION: 1. Normal kidneys, with no hydronephrosis. 2. Diffuse bladder wall thickening and trabeculation, presumably due to chronic bladder outlet obstruction by the enlarged prostate. Electronically Signed   By: Ilona Sorrel M.D.   On: 04/22/2021 19:46   CT Chest High Resolution  Result Date: 04/23/2021 CLINICAL DATA:  Interstitial lung disease EXAM: CT CHEST WITHOUT CONTRAST TECHNIQUE: Multidetector CT imaging of the chest was performed following the standard protocol without intravenous contrast. High resolution imaging of the lungs, as well as inspiratory and expiratory imaging, was performed.  COMPARISON:  03/20/2017, 03/17/2016 FINDINGS: Cardiovascular: Aortic atherosclerosis. Normal heart size. Left and right coronary artery calcifications. No pericardial effusion. Mediastinum/Nodes: Prominent mediastinal and hilar lymph nodes, unchanged compared to prior examination. Thyroid gland, trachea, and esophagus demonstrate no significant findings. Lungs/Pleura: There is moderate pulmonary fibrosis in a pattern with apical to basal gradient, featuring irregular peripheral interstitial opacity, interlobular septal thickening, subtle traction bronchiectasis, and small areas of subpleural bronchiolectasis. Fibrotic findings are significantly worsened compared to prior examination dated 2017. No significant air trapping on expiratory phase imaging. No pleural effusion or pneumothorax. Upper Abdomen: No acute abnormality. Musculoskeletal: No chest wall mass or suspicious bone lesions identified. IMPRESSION: 1. There is moderate pulmonary fibrosis in a pattern with apical to basal gradient, featuring irregular peripheral interstitial opacity, interlobular septal thickening, subtle traction bronchiectasis, and small areas of subpleural bronchiolectasis. Fibrotic findings are significantly worsened compared to prior examination dated 2017. Findings are categorized as probable UIP per consensus guidelines: Diagnosis of Idiopathic Pulmonary Fibrosis: An Official ATS/ERS/JRS/ALAT Clinical Practice Guideline. [place], Iss 5, [phone], Mar 11 2017. 2. No acute airspace opacity. 3. Coronary artery disease. Aortic Atherosclerosis (ICD10-I70.0). Electronically Signed   By: Delanna Ahmadi M.D.   On: 04/23/2021 12:23   DG Chest Port 1 View  Result Date: 04/22/2021 CLINICAL DATA:  Shortness of breath EXAM: PORTABLE CHEST 1 VIEW COMPARISON:  01/09/2020 FINDINGS: Chronic interstitial lung disease. No new consolidation. No pleural effusion. Stable mild cardiomegaly. IMPRESSION: Chronic interstitial  lung disease.  No definite acute abnormality. Electronically Signed   By: Macy Mis M.D.   On: 04/22/2021 12:49   ECHOCARDIOGRAM COMPLETE  Result Date: 04/23/2021    ECHOCARDIOGRAM REPORT   Patient Name:   Bruce Cohen Date of Exam: 04/23/2021 Medical Rec #:  [id number]        Height:       75.0 in Accession #:    [id number]       Weight:       169.2 lb Date of Birth:  [birth year]        BSA:          2.044 m Patient Age:    80 years         BP:           114/70 mmHg Patient Gender: M                HR:           49 bpm. Exam Location:  Inpatient Procedure: 2D Echo, Color  Doppler and Cardiac Doppler Indications:     CHF  History:         Patient has no prior history of Echocardiogram examinations.                  Risk Factors:Family History of Coronary Artery Disease,                  Hypertension and Dyslipidemia.  Sonographer:     Helmut Muster Referring Phys:  [id number] Jonnie Finner Diagnosing Phys: Sanda Klein MD IMPRESSIONS  1. Left ventricular ejection fraction, by estimation, is 50 to 55%. The left ventricle has low normal function. The left ventricle demonstrates regional wall motion abnormalities (see scoring diagram/findings for description). There is moderate concentric left ventricular hypertrophy. Left ventricular diastolic parameters are consistent with Grade II diastolic dysfunction (pseudonormalization). Elevated left atrial pressure. There is mild hypokinesis of the left ventricular, apical segment.  2. Right ventricular systolic function is normal. The right ventricular size is normal.  3. Left atrial size was severely dilated.  4. Right atrial size was severely dilated.  5. The mitral valve is normal in structure. Trivial mitral valve regurgitation.  6. The aortic valve is tricuspid. Aortic valve regurgitation is not visualized. No aortic stenosis is present. FINDINGS  Left Ventricle: Left ventricular ejection fraction, by estimation, is 50 to 55%. The left ventricle has low  normal function. The left ventricle demonstrates regional wall motion abnormalities. The left ventricular internal cavity size was normal in size. There is moderate concentric left ventricular hypertrophy. Left ventricular diastolic parameters are consistent with Grade II diastolic dysfunction (pseudonormalization). Elevated left atrial pressure. Right Ventricle: The right ventricular size is normal. No increase in right ventricular wall thickness. Right ventricular systolic function is normal. Left Atrium: Left atrial size was severely dilated. Right Atrium: Right atrial size was severely dilated. Pericardium: There is no evidence of pericardial effusion. Mitral Valve: The mitral valve is normal in structure. Mild mitral annular calcification. Trivial mitral valve regurgitation. Tricuspid Valve: The tricuspid valve is normal in structure. Tricuspid valve regurgitation is mild. Aortic Valve: The aortic valve is tricuspid. Aortic valve regurgitation is not visualized. No aortic stenosis is present. Pulmonic Valve: The pulmonic valve was grossly normal. Pulmonic valve regurgitation is not visualized. Aorta: The aortic root and ascending aorta are structurally normal, with no evidence of dilitation. IAS/Shunts: No atrial level shunt detected by color flow Doppler.  LEFT VENTRICLE PLAX 2D LVIDd:         5.20 cm     Diastology LVIDs:         4.20 cm     LV e' medial:   3.78 cm/s LV PW:         1.40 cm     LV E/e' medial: 16.8 LV IVS:        1.40 cm LVOT diam:     2.40 cm LV SV:         76 LV SV Index:   37 LVOT Area:     4.52 cm  LV Volumes (MOD) LV vol d, MOD A2C: 92.2 ml LV vol d, MOD A4C: 76.5 ml LV vol s, MOD A2C: 46.0 ml LV vol s, MOD A4C: 38.6 ml LV SV MOD A2C:     46.2 ml LV SV MOD A4C:     76.5 ml LV SV MOD BP:      41.3 ml LEFT ATRIUM  Index        RIGHT ATRIUM           Index LA diam:        3.90 cm  1.91 cm/m   RA Area:     27.80 cm LA Vol (A2C):   123.0 ml 60.17 ml/m  RA Volume:   87.60 ml   42.86 ml/m LA Vol (A4C):   99.4 ml  48.63 ml/m LA Biplane Vol: 112.0 ml 54.79 ml/m  AORTIC VALVE LVOT Vmax:   94.60 cm/s LVOT Vmean:  46.200 cm/s LVOT VTI:    0.167 m  AORTA Ao Root diam: 3.80 cm Ao Asc diam:  3.70 cm MITRAL VALVE               TRICUSPID VALVE MV Area (PHT): 1.44 cm    TR Peak grad:   30.7 mmHg MV Decel Time: 525 msec    TR Vmax:        277.00 cm/s MV E velocity: 63.40 cm/s MV A velocity: 40.70 cm/s  SHUNTS MV E/A ratio:  1.56        Systemic VTI:  0.17 m                            Systemic Diam: 2.40 cm Mihai Croitoru MD Electronically signed by Sanda Klein MD Signature Date/Time: 04/23/2021/7:42:52 PM    Final      Patient Profile     80 y.o. male with a history of dilated cardiomyopathy and PAF complicated by amiodarone use and bradycardia; HTN, Thoracic Aortic aneursym, CKD Stage IIIb, chronic pericarditis, COVID-19 (sub acute) seen for SOB  Assessment & Plan     Heart Failure improved Ejection Fraction  HTN - NYHA class I, Stage C, euvolemic, etiology from AF suspected - Diuretic regimen:40 mg PO daily - Discussed the importance of fluid restriction of < 2 L, salt restriction, and checking daily weights  - BB deferred in the setting of stable EF from prior and bradycardia - Continue losartan 50 mg PO daily - Recovering from AKI, will defer MRA at this time; will price check SGLT2i (outpatient start)  PAF - presently in Sinus bradycardia on minimal amiodarone - give new UIP suspected, if needed we can come off amiodarone but will in the long term will need new rhythm control plan with limited options (discussed with other teams- presently ok for amiodarone) - continue eliquis  Mild thoracic aortic aneurysm -40 mm on non- con study, likely similar to 2018 CT angio; conservative outpatient monitoring  Aortic Atherosclerosis Prominent LAD and RCA CAC Mitral Annular calcification - continue statin - LDL in 2016 was < 60 outpatient f/u at this time  Chronic  pericarditis- no CP presently through admission  [facility] will sign off.   Medication Recommendations:  ARB and AC and amiodarone as above Other recommendations (labs, testing, etc):  NA Follow up as an outpatient:  we are working to arrange follow up  For questions or updates, please contact [place] Please consult [email or web address] for contact info under Cardiology/STEMI.      Signed, Werner Lean, MD  04/24/2021, 9:51 AM

## 2021-04-24 NOTE — Progress Notes (Signed)
Patient attempted ambulation in hallway with front wheeled walker, patient started at 02 SAT continuously monitoring reading 100%, within 10 steps patient 02 SAT dropped to 85% and patient "needed to sit down."  MD updated

## 2021-04-24 NOTE — Evaluation (Signed)
Physical Therapy Evaluation Patient Details Name: Bruce Cohen MRN: [id number] DOB: [birth year] Today's Date: 04/24/2021  History of Present Illness  Bruce Cohen is a 80 y.o. male with medical history significant of systolic HF, a fib on eliquis, HTN, CAD. Presenting with dyspnea. He's had dyspnea for the last month. Initially, he thought is was because of a fib, so he largely ignored it. However, as a couple of weeks went by, he noticed that he was developing congestion and fevers. He saw his PCP and on 04/13/21; he was Dx'd with COVID. He was given paxlovid and levaquin. He completed that therapy.  Clinical Impression  Pt admitted with above diagnosis. Pt currently with functional limitations due to the deficits listed below (see PT Problem List). Pt is being d/c today from hospital  Recommend [place].  Will d/c at this time.    Recommendations for follow up therapy are one component of a multi-disciplinary discharge planning process, led by the attending physician.  Recommendations may be updated based on patient status, additional functional criteria and insurance authorization.  Follow Up Recommendations Home health PT    Equipment Recommendations  None recommended by PT    Recommendations for Other Services       Precautions / Restrictions Precautions Precautions: Other (comment) Precaution Comments: monitor o2 Restrictions Weight Bearing Restrictions: No      Mobility  Bed Mobility Overal bed mobility: Modified Independent                  Transfers Overall transfer level: Modified independent                  Ambulation/Gait Ambulation/Gait assistance: Min guard;Supervision Gait Distance (Feet): 50 Feet (plus 80 and 50) Assistive device: Rolling walker (2 wheeled) Gait Pattern/deviations: Step-through pattern;Trunk flexed     General Gait Details: Amb on 2 L initially and o2 dropped to 81% with 50 of gait.  o2 increased to 3 during sitting rest  break and o2 quickly increased into the 90s. Amb on 3 L/min and the time when he feels he needs to sit does coincide with drop in o2 to 85%, but once he sits, it rebounds into mid 90s rather quickly.  Stairs            Wheelchair Mobility    Modified Rankin (Stroke Patients Only)       Balance Overall balance assessment: No apparent balance deficits (not formally assessed)                                           Pertinent Vitals/Pain Pain Assessment: No/denies pain    Home Living Family/patient expects to be discharged to:: Private residence Living Arrangements: Spouse/significant other;Children Available Help at Discharge: Family;Available 24 hours/day Type of Home: House Home Access: Stairs to enter     Home Layout: Two level Home Equipment: Environmental consultant - 2 wheels;Cane - single point;Shower seat      Prior Function Level of Independence: Needs assistance   Gait / Transfers Assistance Needed: AMb with cane recently and can only take a few steps.  Stairs have been difficult.  Is planning on starting to stay downstairs and sleep in recliner           Hand Dominance        Extremity/Trunk Assessment   Upper Extremity Assessment Upper Extremity Assessment: Overall WFL for tasks assessed  Lower Extremity Assessment Lower Extremity Assessment: Overall WFL for tasks assessed       Communication   Communication: HOH  Cognition Arousal/Alertness: Awake/alert Behavior During Therapy: WFL for tasks assessed/performed Overall Cognitive Status: Within Functional Limits for tasks assessed                                        General Comments      Exercises     Assessment/Plan    PT Assessment All further PT needs can be met in the next venue of care  PT Problem List Cardiopulmonary status limiting activity       PT Treatment Interventions      PT Goals (Current goals can be found in the Care Plan section)  Acute  Rehab PT Goals Patient Stated Goal: be able to walk without getting SOB PT Goal Formulation: All assessment and education complete, DC therapy    Frequency     Barriers to discharge        Co-evaluation               AM-PAC PT "6 Clicks" Mobility  Outcome Measure Help needed turning from your back to your side while in a flat bed without using bedrails?: None Help needed moving from lying on your back to sitting on the side of a flat bed without using bedrails?: None Help needed moving to and from a bed to a chair (including a wheelchair)?: None Help needed standing up from a chair using your arms (e.g., wheelchair or bedside chair)?: None Help needed to walk in hospital room?: A Little Help needed climbing 3-5 steps with a railing? : A Little 6 Click Score: 22    End of Session Equipment Utilized During Treatment: Gait belt;Oxygen Activity Tolerance: Treatment limited secondary to medical complications (Comment) (o2 sats) Patient left: in chair;with call bell/phone within reach Nurse Communication: Mobility status PT Visit Diagnosis: Other abnormalities of gait and mobility (R26.89)    Time: 2951-8841 PT Time Calculation (min) (ACUTE ONLY): 27 min   Charges:   PT Evaluation $PT Eval Low Complexity: 1 Low PT Treatments $Gait Training: 8-22 mins        Sheldon Amara L. Tamala Julian, [place]  04/24/2021   Galen Manila 04/24/2021, 10:41 AM

## 2021-04-24 NOTE — Telephone Encounter (Signed)
Please schedule patient for hospital follow up for pulmonary fibrosis in 1-2 weeks. He will need PFTs scheduled as well. He can follow up with Dr. Chase Caller, Dr. Vaughan Browner or myself.  Thanks, Wille Glaser

## 2021-04-24 NOTE — Plan of Care (Signed)
  Problem: Health Behavior/Discharge Planning: Goal: Ability to manage health-related needs will improve Outcome: Progressing   Problem: Safety: Goal: Ability to remain free from injury will improve Outcome: Progressing   

## 2021-04-25 DIAGNOSIS — I5042 Chronic combined systolic (congestive) and diastolic (congestive) heart failure: Secondary | ICD-10-CM | POA: Diagnosis not present

## 2021-04-26 LAB — ANTI-DNA ANTIBODY, DOUBLE-STRANDED: ds DNA Ab: <1 IU/mL (ref 0–9)

## 2021-04-26 LAB — ANTI-SCLERODERMA ANTIBODY: Scleroderma (Scl-70) (ENA) Antibody, IgG: <0.2 AI (ref 0.0–0.9)

## 2021-04-26 LAB — SJOGRENS SYNDROME-B EXTRACTABLE NUCLEAR ANTIBODY: SSB (La) (ENA) Antibody, IgG: <0.2 AI (ref 0.0–0.9)

## 2021-04-26 LAB — SJOGRENS SYNDROME-A EXTRACTABLE NUCLEAR ANTIBODY: SSA (Ro) (ENA) Antibody, IgG: <0.2 AI (ref 0.0–0.9)

## 2021-04-27 LAB — ANTINUCLEAR ANTIBODIES, IFA: ANA Ab, IFA: NEGATIVE

## 2021-04-28 NOTE — Telephone Encounter (Signed)
Is 21 days. Where did 30 days come from [place]?

## 2021-04-30 DIAGNOSIS — I5020 Unspecified systolic (congestive) heart failure: Secondary | ICD-10-CM | POA: Diagnosis not present

## 2021-04-30 DIAGNOSIS — R0902 Hypoxemia: Secondary | ICD-10-CM | POA: Diagnosis not present

## 2021-04-30 DIAGNOSIS — J8410 Pulmonary fibrosis, unspecified: Secondary | ICD-10-CM | POA: Diagnosis not present

## 2021-04-30 DIAGNOSIS — Z23 Encounter for immunization: Secondary | ICD-10-CM | POA: Diagnosis not present

## 2021-04-30 DIAGNOSIS — I10 Essential (primary) hypertension: Secondary | ICD-10-CM | POA: Diagnosis not present

## 2021-04-30 DIAGNOSIS — I4891 Unspecified atrial fibrillation: Secondary | ICD-10-CM | POA: Diagnosis not present

## 2021-04-30 DIAGNOSIS — B351 Tinea unguium: Secondary | ICD-10-CM | POA: Diagnosis not present

## 2021-04-30 DIAGNOSIS — R531 Weakness: Secondary | ICD-10-CM | POA: Diagnosis not present

## 2021-04-30 DIAGNOSIS — E559 Vitamin D deficiency, unspecified: Secondary | ICD-10-CM | POA: Diagnosis not present

## 2021-04-30 LAB — HYPERSENSITIVITY PNEUMONITIS
A. Pullulans Abs: NEGATIVE
A.Fumigatus #1 Abs: NEGATIVE
Micropolyspora faeni, IgG: NEGATIVE
Pigeon Serum Abs: NEGATIVE
Thermoact. Saccharii: NEGATIVE
Thermoactinomyces vulgaris, IgG: NEGATIVE

## 2021-05-04 ENCOUNTER — Telehealth: Payer: Self-pay | Admitting: Pulmonary Disease

## 2021-05-04 DIAGNOSIS — J849 Interstitial pulmonary disease, unspecified: Secondary | ICD-10-CM

## 2021-05-04 NOTE — Telephone Encounter (Signed)
Patient last seen in office 08/29/20217. Pending appt with Dr. Erin Fulling for 06/01/2021 with PFT prior.   Received verbal from patient to speak with son, [name].  [name] stated that it was mentioned during recent admission that patient would be referred to PT.  [name] is requesting update. I do not see that a referral was placed. [name] stated that he reached out to PCP as well but has not received a call back.   Dr. Erin Fulling, please advise. Thanks

## 2021-05-05 NOTE — Telephone Encounter (Signed)
The plan was to see the patient in clinic for follow up visit and then likely make the referral to pulmonary rehab, but let's go ahead and place the referral for pulmonary rehab as there is a 1-2 month waiting period so we can get him on the waiting list.   Thanks, Wille Glaser

## 2021-05-06 NOTE — Telephone Encounter (Signed)
Spoke with the pt's son, [name] and notified him of Dr August Albino response  Rehab referral was placed  Nothing further needed

## 2021-05-07 ENCOUNTER — Encounter: Payer: Self-pay | Admitting: Physician Assistant

## 2021-05-07 NOTE — Progress Notes (Deleted)
Cardiology Office Note    Date:  05/07/2021   ID:  Bruce Cohen, DOB [birth year], MRN [id number]  PCP:  Josetta Huddle, MD  Cardiologist:  Larae Grooms, MD  Electrophysiologist:  None   Chief Complaint: ***  History of Present Illness:   Bruce Cohen is a 80 y.o. male with history of recurrent pericarditis, persistent atrial fibrillation, NICM, mild CAD in 2016, sinus bradycardia, RBBB, HTN, HLD, borderline thoracic aortic aneurysm, CKD 3b, OSA who presents for hospital f/u. He has a prior history of NICM in 2016 with EF 30-35% secondary to tachycardia/atrial fib. Cath showed only mild nonobstructive CAD. He underwent cardioversion on a few different occasions but ultimately required amiodarone for rhythm control. Repeat echo 12/2014 showed EF 50%. He was admitted in October 2022 with progressively worsening SOB, found to have Covid - however, symptoms pre-dated diagnosis of Covid. Troponin was mildly elevated at 54, not trended. 2D echo 04/23/21 EF 50-55%, moderate concentric LVH, grade 2 DD, mild hypokinesis of LV apical segment, severe biatrial enlargement. Cardiology was consulted for bradycardia and shortness of breath. Appears majority of HRs were 40s-50s, reported as sinus bradycardia, amiodarone continued. Cardiology team felt dyspnea was more pulmonology related. High res CT showed fibrotic changes with UIP pattern. Per pulm notes, CXR changes pre-dated starting amiodarone. Autoimmune panel was sent and pulmonology will be following further. He is now on O2.  Last labs elevated Cr, dec Hgb,  no recent lipids Does not use cPAP Carries hx of TAA but borderline 2018, no report of this on echo ro CT since then Persistent atrial fibrillation with recent sinus bradycardia CAD NICM Essential HTN      Labwork independently reviewed: 04/2021 Mg 2.1, K 4.0, Cr 1.48, LFTs ok except albumin 2.6, Hgb 10.8, elevated CRP, TSH wnl 2016 LDL 60   Past Medical History:   Diagnosis Date   CAD (coronary artery disease)    a. LHC 3/16: pLAD 20, mLAD 20, pRCA 20, EF 25%   CHF (congestive heart failure) (HCC)    hx of acute systolic hf- 1749   History of PFTs    a. PFTs 8/17: FEV1 88% predicted; FEV1/FVC 81%, corr DLCO 57%   Hyperlipidemia    Hypertension    LVH (left ventricular hypertrophy)    Mitral regurgitation    NICM (nonischemic cardiomyopathy) ([facility])    Tachy mediated 2/2 AF with RVR >> a. Echo 3/16: EF 30-35% >> b. Echo 6/16:  EF 50% with mild apical HK, mild LVH, Gr 2 DD, mild MR, severe BAE, PASP 43 mmHg   OSA (obstructive sleep apnea) 02/12/2015   Mild to moderate with AHI 14.7/hr no cpap or oxygen    PAF (paroxysmal atrial fibrillation) ([facility]) 09/2014   CHADS2-VASc=5 >> Eliquis // 6/17: Amiodarone started   Pericarditis    a. Hospitalized in [place]. Episode in 1998. Had a cath which was clean (also in 1990s). 2 further episodes that were mild.   PONV (postoperative nausea and vomiting)    Thoracic ascending aortic aneurysm 04/26/2016   High res CT 9/17: 4.0 cm ascending thoracic aortic aneurysm // Chest CTA 9/18: ascending aorta 3.9 cm, CAD, benign thyroid nodules, stable lung reticular changes, aortic atherosclerosis    Past Surgical History:  Procedure Laterality Date   APPENDECTOMY     BACK SURGERY     CARDIAC CATHETERIZATION  1998   "clean"   CARDIOVERSION N/A 11/21/2014   Procedure: CARDIOVERSION;  Surgeon: Fay Records, MD;  Location: [facility];  Service: Cardiovascular;  Laterality: N/A;   CARDIOVERSION N/A 10/22/2015   Procedure: CARDIOVERSION;  Surgeon: Herminio Commons, MD;  Location: [facility];  Service: Cardiovascular;  Laterality: N/A;   FRACTURE SURGERY     LEFT HEART CATHETERIZATION WITH CORONARY ANGIOGRAM N/A 10/01/2014   Procedure: LEFT HEART CATHETERIZATION WITH CORONARY ANGIOGRAM;  Surgeon: Wellington Hampshire, MD;  Location: [facility];  Service: Cardiovascular;  Laterality: N/A;   LESION REMOVAL Left 10/31/2019    Procedure: EXCISION SKIN NEOPLASM LEFT UPPER CHEST WALL, 9X3 CM WITH LAYERED CLOSURE;  Surgeon: Armandina Gemma, MD;  Location: [facility];  Service: General;  Laterality: Left;   LUMBAR LAMINECTOMY  1970's   "partial"   ORIF FINGER / THUMB FRACTURE Left ~ 2006   "thumb"   TONSILLECTOMY      Current Medications: No outpatient medications have been marked as taking for the 05/11/21 encounter (Appointment) with Charlie Pitter, PA-C.   ***   Allergies:   Patient has no known allergies.   Social History   Socioeconomic History   Marital status: Married    Spouse name: Not on file   Number of children: 3   Years of education: 17 years    Highest education level: Not on file  Occupational History   Not on file  Tobacco Use   Smoking status: Former    Packs/day: 0.25    Years: 60.00    Pack years: 15.00    Types: Cigarettes    Quit date: 07/11/1998    Years since quitting: 22.8   Smokeless tobacco: Never  Vaping Use   Vaping Use: Never used  Substance and Sexual Activity   Alcohol use: Yes    Alcohol/week: 0.0 standard drinks    Comment: 09/29/2014 "maybe 3-4 beers/month"   Drug use: No   Sexual activity: Not Currently  Other Topics Concern   Not on file  Social History Narrative   Lives with wife and son.     Social Determinants of Health   Financial Resource Strain: Not on file  Food Insecurity: Not on file  Transportation Needs: Not on file  Physical Activity: Not on file  Stress: Not on file  Social Connections: Not on file     Family History:  The patient's ***family history includes Heart disease in his mother; Heart failure in his father; Stroke in his mother. There is no history of CAD or Heart attack.  ROS:   Please see the history of present illness. Otherwise, review of systems is positive for ***.  All other systems are reviewed and otherwise negative.    EKGs/Labs/Other Studies Reviewed:    Studies reviewed are outlined and summarized above. Reports  included below if pertinent.  2D Echo 04/2021  1. Left ventricular ejection fraction, by estimation, is 50 to 55%. The  left ventricle has low normal function. The left ventricle demonstrates  regional wall motion abnormalities (see scoring diagram/findings for  description). There is moderate  concentric left ventricular hypertrophy. Left ventricular diastolic  parameters are consistent with Grade II diastolic dysfunction  (pseudonormalization). Elevated left atrial pressure. There is mild  hypokinesis of the left ventricular, apical segment.   2. Right ventricular systolic function is normal. The right ventricular  size is normal.   3. Left atrial size was severely dilated.   4. Right atrial size was severely dilated.   5. The mitral valve is normal in structure. Trivial mitral valve  regurgitation.   6. The aortic valve is tricuspid. Aortic valve  regurgitation is not  visualized. No aortic stenosis is present.   Cath 09/2014 Procedural Findings:   Hemodynamics: AO:  137/82   mmHg LV:  135/10    mmHg LVEDP: 14  mmHg   Coronary angiography: Coronary dominance: Right   Left Main:  Normal Left Anterior Descending (LAD):  Normal in size with 20% proximal stenosis and 20% mid stenosis. 1st diagonal (D1):  Normal in size with no significant disease. 2nd diagonal (D2):  Small in size with no significant disease. 3rd diagonal (D3):  Medium in size with no significant disease. Circumflex (LCx):  Normal in size and nondominant. The vessel has minor irregularities. 1st obtuse marginal:  Normal 2nd obtuse marginal:  Large in size with no significant disease. 3rd obtuse marginal:  Normal  Right Coronary Artery: Large in size and dominant. There is 20% proximal stenosis. Posterior descending artery: Normal in size with no significant disease. Posterior AV segment: No significant disease Posterolateral branchs:  No significant disease.   Left ventriculography: Left ventricular systolic  function is severely reduced , LVEF is estimated at 25 %, there is moderate mitral regurgitation . Severe mitral annular calcifications are noted.   Final Conclusions:   1. Mild nonobstructive coronary artery disease. 2. Severely reduced LV systolic function with an ejection fraction of 25% and moderate mitral regurgitation. 3. Mildly elevated left ventricular end-diastolic pressure.   Recommendations:  The patient has nonischemic cardiomyopathy which could be tachycardia induced. I switched diltiazem to metoprolol. Once heart rate is controlled, consider adding back Diovan. I switched furosemide to by mouth given that the LVEDP is only mildly elevated. Start anticoagulation with a NOAC tomorrow.   Kathlyn Sacramento MD, [facility] 10/01/2014, 3:00 PM       EKG:  EKG is ordered today, personally reviewed, demonstrating ***  Recent Labs: 04/22/2021: B Natriuretic Peptide 582.4; TSH 1.593 04/24/2021: ALT 19; BUN 20; Creatinine, Ser 1.48; Hemoglobin 10.8; Magnesium 2.1; Platelets 346; Potassium 4.0; Sodium 140  Recent Lipid Panel    Component Value Date/Time   CHOL 122 01/14/2015 1147   TRIG 92.0 01/14/2015 1147   HDL 43.90 01/14/2015 1147   CHOLHDL 3 01/14/2015 1147   VLDL 18.4 01/14/2015 1147   LDLCALC 60 01/14/2015 1147    PHYSICAL EXAM:    VS:  There were no vitals taken for this visit.  BMI: There is no height or weight on file to calculate BMI.  GEN: Well nourished, well developed male in no acute distress HEENT: normocephalic, atraumatic Neck: no JVD, carotid bruits, or masses Cardiac: ***RRR; no murmurs, rubs, or gallops, no edema  Respiratory:  clear to auscultation bilaterally, normal work of breathing GI: soft, nontender, nondistended, + BS MS: no deformity or atrophy Skin: warm and dry, no rash Neuro:  Alert and Oriented x 3, Strength and sensation are intact, follows commands Psych: euthymic mood, full affect  Wt Readings from Last 3 Encounters:  04/22/21 169 lb 4 oz  (76.8 kg)  10/22/20 196 lb (88.9 kg)  01/09/20 208 lb 1.8 oz (94.4 kg)     ASSESSMENT & PLAN:   ***  {The patient has an active order for outpatient cardiac rehabilitation.   Please indicate if the patient is ready to start. Do NOT delete this.  It will auto delete.  Refresh note, then sign.              Click here to document readiness and see contraindications.  :1}  Cardiac Rehabilitation Eligibility Assessment      Disposition: F/u  with ***   Medication Adjustments/Labs and Tests Ordered: Current medicines are reviewed at length with the patient today.  Concerns regarding medicines are outlined above. Medication changes, Labs and Tests ordered today are summarized above and listed in the Patient Instructions accessible in Encounters.   Signed, Charlie Pitter, PA-C  05/07/2021 5:11 PM    [facility] [place], [place], [place]  [address] Phone: [phone]; Fax: [phone]

## 2021-05-11 ENCOUNTER — Ambulatory Visit: Payer: Medicare Other | Admitting: Physician Assistant

## 2021-05-11 DIAGNOSIS — R001 Bradycardia, unspecified: Secondary | ICD-10-CM

## 2021-05-11 DIAGNOSIS — I10 Essential (primary) hypertension: Secondary | ICD-10-CM

## 2021-05-11 DIAGNOSIS — I4819 Other persistent atrial fibrillation: Secondary | ICD-10-CM

## 2021-05-11 DIAGNOSIS — I2510 Atherosclerotic heart disease of native coronary artery without angina pectoris: Secondary | ICD-10-CM

## 2021-05-11 DIAGNOSIS — I428 Other cardiomyopathies: Secondary | ICD-10-CM

## 2021-05-14 ENCOUNTER — Telehealth (HOSPITAL_COMMUNITY): Payer: Self-pay

## 2021-05-14 NOTE — Telephone Encounter (Signed)
Attempted to contact pt in regards to PR, LMTCB. 

## 2021-05-18 ENCOUNTER — Telehealth (HOSPITAL_COMMUNITY): Payer: Self-pay | Admitting: Internal Medicine

## 2021-05-26 DIAGNOSIS — J8410 Pulmonary fibrosis, unspecified: Secondary | ICD-10-CM | POA: Diagnosis not present

## 2021-05-26 DIAGNOSIS — I4891 Unspecified atrial fibrillation: Secondary | ICD-10-CM | POA: Diagnosis not present

## 2021-05-26 DIAGNOSIS — D6869 Other thrombophilia: Secondary | ICD-10-CM | POA: Diagnosis not present

## 2021-05-26 DIAGNOSIS — M545 Low back pain, unspecified: Secondary | ICD-10-CM | POA: Diagnosis not present

## 2021-05-26 DIAGNOSIS — R531 Weakness: Secondary | ICD-10-CM | POA: Diagnosis not present

## 2021-05-26 DIAGNOSIS — I5020 Unspecified systolic (congestive) heart failure: Secondary | ICD-10-CM | POA: Diagnosis not present

## 2021-05-26 DIAGNOSIS — I10 Essential (primary) hypertension: Secondary | ICD-10-CM | POA: Diagnosis not present

## 2021-05-26 DIAGNOSIS — N390 Urinary tract infection, site not specified: Secondary | ICD-10-CM | POA: Diagnosis not present

## 2021-05-26 DIAGNOSIS — D72829 Elevated white blood cell count, unspecified: Secondary | ICD-10-CM | POA: Diagnosis not present

## 2021-05-26 DIAGNOSIS — E559 Vitamin D deficiency, unspecified: Secondary | ICD-10-CM | POA: Diagnosis not present

## 2021-05-26 DIAGNOSIS — I5042 Chronic combined systolic (congestive) and diastolic (congestive) heart failure: Secondary | ICD-10-CM | POA: Diagnosis not present

## 2021-05-27 ENCOUNTER — Telehealth: Payer: Self-pay | Admitting: Pulmonary Disease

## 2021-05-27 NOTE — Telephone Encounter (Signed)
Pulm rehab referral was placed last month but per the referral notes, the patient wanted to hold off on rehab until seeing JD. He has an appt next week with JD.   Will close encounter.

## 2021-05-31 ENCOUNTER — Other Ambulatory Visit: Payer: Self-pay | Admitting: Pulmonary Disease

## 2021-05-31 DIAGNOSIS — J849 Interstitial pulmonary disease, unspecified: Secondary | ICD-10-CM

## 2021-06-01 ENCOUNTER — Ambulatory Visit: Payer: Medicare Other

## 2021-06-01 ENCOUNTER — Ambulatory Visit: Payer: Medicare Other | Admitting: Pulmonary Disease

## 2021-06-01 ENCOUNTER — Encounter: Payer: Self-pay | Admitting: Pulmonary Disease

## 2021-06-01 ENCOUNTER — Other Ambulatory Visit: Payer: Self-pay

## 2021-06-01 VITALS — BP 124/62 | HR 60 | Temp 98.1°F | Ht 75.0 in | Wt 176.0 lb

## 2021-06-01 DIAGNOSIS — G4733 Obstructive sleep apnea (adult) (pediatric): Secondary | ICD-10-CM

## 2021-06-01 DIAGNOSIS — J8410 Pulmonary fibrosis, unspecified: Secondary | ICD-10-CM

## 2021-06-01 DIAGNOSIS — J9611 Chronic respiratory failure with hypoxia: Secondary | ICD-10-CM

## 2021-06-01 NOTE — Progress Notes (Signed)
Synopsis: Referred in November 2022 for Pulmonary Fibrosis   Subjective:   PATIENT ID: Bruce Cohen GENDER: male DOB: [birth year], MRN: [id number]   HPI  Chief Complaint  Patient presents with   Follow-up    Follow up    Bruce Cohen is an 80 year old male, former smoker with untreated OSA, atrial fibrillation, hypertension, CAD, non-ischemic cardiomyopathy and CKDIIIa who is referred to pulmonary clinic for pulmonary fibrosis.   Patient was admitted 10/13-10/15 for shortness of breath due to COVID-19 infection and pulmonary fibrosis.  He was qualified for supplemental oxygen and discharged on 2 L nasal cannula.  High-resolution CT chest scan during the admission showed moderate pulmonary fibrosis, interlobular septal thickening and several traction bronchiectasis with areas of subpleural bronchiolectasis.  Fibrotic changes are significantly worsened compared to prior exam in 2017.  Findings are categorized as probable UIP.  Patient reports losing weight since hospitalization as he has not been very mobile in order to prepare food for himself.  His room is on the second floor in the kitchen is on the main level.  His son has been trying to bring him food as often as possible but he feels he is losing weight as he is not eating as much as he was previously.  He does report his physical activity is improving over the last week as he is able to make it to the kitchen to prepare food.  His appetite remains good.  Overall he reports having good days and bad days with increased dyspnea with exertion.  He denies any cough, sputum production or wheezing on the bad days.  Sleep study in 2016 showed an AHI of 14.7/h with mild oxygen desaturation to 84%.  Severe periodic limb movements occurred infrequent arousals.  Was unable to complete enough series on pulmonary function testing today due to fatigue and shortness of breath.  He is a former smoker and quit 2 years ago. Some concern for ILD  dating back to chest CT in 09/2014 with scattered infiltrates but were in the setting of acute illness while being treated for a pneumonia and heart failure.  He was started on amiodarone 12/2015, but has CXR changes of mild diffuse interstitial prominence that predates starting amiodarone.  Chest CT was repeated on 03/2016 which showed resolved areas of consolidation, interlobular septal thickening and subpleural reticulation throughout both lungs, but favored to represent mild pulmonary edema, decreased mild mediastinal lymphadenopathy c/w benign reactive adenopathy, however NSIP could not be excluded.  He was then lost in pulmonary follow-up.  Past Medical History:  Diagnosis Date   CAD (coronary artery disease)    a. LHC 3/16: pLAD 20, mLAD 20, pRCA 20, EF 25%   History of PFTs    a. PFTs 8/17: FEV1 88% predicted; FEV1/FVC 81%, corr DLCO 57%   Hyperlipidemia    Hypertension    ILD (interstitial lung disease) (HCC)    LVH (left ventricular hypertrophy)    Mitral regurgitation    NICM (nonischemic cardiomyopathy) ([place])    Tachy mediated 2/2 AF with RVR >> a. Echo 3/16: EF 30-35% >> b. Echo 6/16:  EF 50% with mild apical HK, mild LVH, Gr 2 DD, mild MR, severe BAE, PASP 43 mmHg   OSA (obstructive sleep apnea) 02/12/2015   Mild to moderate with AHI 14.7/hr no cpap   Pericarditis    a. Hospitalized in [place]. Episode in 1998. Had a cath which was clean (also in 1990s). 2 further episodes that were mild.  Persistent atrial fibrillation ([place]) 09/2014   CHADS2-VASc=5 >> Eliquis // 6/17: Amiodarone started   PONV (postoperative nausea and vomiting)    Pulmonary fibrosis (HCC)    Sinus bradycardia    Thoracic ascending aortic aneurysm 04/26/2016   High res CT 9/17: 4.0 cm ascending thoracic aortic aneurysm // Chest CTA 9/18: ascending aorta 3.9 cm, CAD, benign thyroid nodules, stable lung reticular changes, aortic atherosclerosis     Family History  Problem Relation Age of Onset   Heart  disease Mother        VALVE SURGERY   Stroke Mother    Heart failure Father    CAD Neg Hx    Heart attack Neg Hx      Social History   Socioeconomic History   Marital status: Married    Spouse name: Not on file   Number of children: 3   Years of education: 17 years    Highest education level: Not on file  Occupational History   Not on file  Tobacco Use   Smoking status: Former    Packs/day: 0.25    Years: 60.00    Pack years: 15.00    Types: Cigarettes    Quit date: 07/11/1998    Years since quitting: 22.9   Smokeless tobacco: Never  Vaping Use   Vaping Use: Never used  Substance and Sexual Activity   Alcohol use: Yes    Alcohol/week: 0.0 standard drinks    Comment: 09/29/2014 "maybe 3-4 beers/month"   Drug use: No   Sexual activity: Not Currently  Other Topics Concern   Not on file  Social History Narrative   Lives with wife and son.     Social Determinants of Health   Financial Resource Strain: Not on file  Food Insecurity: Not on file  Transportation Needs: Not on file  Physical Activity: Not on file  Stress: Not on file  Social Connections: Not on file  Intimate Partner Violence: Not on file     No Known Allergies   Outpatient Medications Prior to Visit  Medication Sig Dispense Refill   amiodarone (PACERONE) 200 MG tablet TAKE 1 TABLET EVERY DAY AND 1/2 TABLET ON SATURDAY AND SUNDAY 90 tablet 3   apixaban (ELIQUIS) 5 MG TABS tablet Take 1 tablet (5 mg total) by mouth 2 (two) times daily. 60 tablet 11   atorvastatin (LIPITOR) 40 MG tablet Take 1 tablet by mouth at bedtime.     cholecalciferol (VITAMIN D3) 25 MCG (1000 UNIT) tablet Take 1,000 Units by mouth daily.     furosemide (LASIX) 40 MG tablet TAKE 1 TABLET BY MOUTH EVERY DAY 90 tablet 3   losartan (COZAAR) 25 MG tablet Take 2 tablets by mouth daily.     Multiple Vitamin (MULTIVITAMIN WITH MINERALS) TABS tablet Take 1 tablet by mouth daily.     Nystatin (GERHARDT'S BUTT CREAM) CREA Apply 1  application topically 4 (four) times daily. 1 each 0   tamsulosin (FLOMAX) 0.4 MG CAPS capsule Take 1 capsule (0.4 mg total) by mouth daily. 30 capsule 0   metoprolol tartrate (LOPRESSOR) 50 MG tablet 1/2 tablet     No facility-administered medications prior to visit.    Review of Systems  Constitutional:  Positive for malaise/fatigue and weight loss. Negative for chills and fever.  HENT:  Negative for congestion, sinus pain and sore throat.   Eyes: Negative.   Respiratory:  Positive for shortness of breath. Negative for cough, hemoptysis, sputum production and wheezing.   Cardiovascular:  Negative for chest pain, palpitations, orthopnea, claudication and leg swelling.  Gastrointestinal:  Negative for abdominal pain, heartburn, nausea and vomiting.  Genitourinary: Negative.   Musculoskeletal:  Negative for joint pain and myalgias.  Skin:  Negative for rash.  Neurological:  Negative for weakness.  Endo/Heme/Allergies: Negative.   Psychiatric/Behavioral: Negative.     Objective:   Vitals:   06/01/21 1334  BP: 124/62  Pulse: 60  Temp: 98.1 F (36.7 C)  TempSrc: Oral  SpO2: 98%  Weight: 176 lb (79.8 kg)  Height: 6\' 3"  (1.905 m)     Physical Exam Constitutional:      General: He is not in acute distress.    Appearance: He is ill-appearing (chronically).     Comments: thin  HENT:     Head: Normocephalic and atraumatic.  Eyes:     Extraocular Movements: Extraocular movements intact.     Conjunctiva/sclera: Conjunctivae normal.     Pupils: Pupils are equal, round, and reactive to light.  Cardiovascular:     Rate and Rhythm: Normal rate and regular rhythm.     Pulses: Normal pulses.     Heart sounds: Normal heart sounds. No murmur heard. Pulmonary:     Effort: Pulmonary effort is normal.     Breath sounds: Decreased air movement present. Decreased breath sounds and rales (bilateral bases) present.  Abdominal:     General: Bowel sounds are normal.     Palpations: Abdomen  is soft.  Musculoskeletal:     Right lower leg: No edema.     Left lower leg: No edema.  Lymphadenopathy:     Cervical: No cervical adenopathy.  Skin:    General: Skin is warm and dry.  Neurological:     General: No focal deficit present.     Mental Status: He is alert.  Psychiatric:        Mood and Affect: Mood normal.        Behavior: Behavior normal.        Thought Content: Thought content normal.        Judgment: Judgment normal.    CBC    Component Value Date/Time   WBC 8.1 04/24/2021 0352   RBC 3.33 (L) 04/24/2021 0352   HGB 10.8 (L) 04/24/2021 0352   HGB 14.5 12/12/2016 1107   HCT 33.0 (L) 04/24/2021 0352   HCT 42.6 12/12/2016 1107   PLT 346 04/24/2021 0352   PLT 370 12/12/2016 1107   MCV 99.1 04/24/2021 0352   MCV 96 12/12/2016 1107   MCH 32.4 04/24/2021 0352   MCHC 32.7 04/24/2021 0352   RDW 14.0 04/24/2021 0352   RDW 14.3 12/12/2016 1107   LYMPHSABS 1.8 04/24/2021 0352   LYMPHSABS 1.6 12/12/2016 1107   MONOABS 0.7 04/24/2021 0352   EOSABS 0.2 04/24/2021 0352   EOSABS 0.1 12/12/2016 1107   BASOSABS 0.1 04/24/2021 0352   BASOSABS 0.0 12/12/2016 1107     Chest imaging: HRCT Chest 04/23/21 Lungs/Pleura: There is moderate pulmonary fibrosis in a pattern with apical to basal gradient, featuring irregular peripheral interstitial opacity, interlobular septal thickening, subtle traction bronchiectasis, and small areas of subpleural bronchiolectasis. Fibrotic findings are significantly worsened compared to prior examination dated 2017. No significant air trapping on expiratory phase imaging. No pleural effusion or pneumothorax.  PFT: PFT Results Latest Ref Rng & Units 06/01/2021 02/29/2016  FVC-Pre L 3.15 3.79  FVC-Predicted Pre % 69 80  FVC-Post L - 3.82  FVC-Predicted Post % - 80  Pre FEV1/FVC % % 84 81  Post FEV1/FCV % % - 84  FEV1-Pre L 2.64 3.06  FEV1-Predicted Pre % 81 88  FEV1-Post L - 3.20  DLCO uncorrected ml/min/mmHg - 20.79  DLCO UNC% % -  57  DLCO corrected ml/min/mmHg - 20.91  DLCO COR %Predicted % - 57  DLVA Predicted % - 78  TLC L - 6.36  TLC % Predicted % - 83  RV % Predicted % - 86   Labs:  Path:  Echo 04/23/21: LVEF 50 to 55%.  Moderate concentric left ventricle hypertrophy.  Grade 2 diastolic dysfunction.  Right ventricular systolic function is normal.  RV size is normal.  Left atrial size is severely dilated.  Right atrial size is severely dilated.  Heart Catheterization:  Assessment & Plan:   Pulmonary fibrosis (HCC)  Chronic hypoxemic respiratory failure (HCC)  Mild obstructive sleep apnea  Discussion: Bruce Cohen is an 80 year old male, former smoker with untreated OSA, atrial fibrillation, hypertension, CAD, non-ischemic cardiomyopathy and CKDIIIa who is referred to pulmonary clinic for pulmonary fibrosis.   Patient's pulmonary fibrosis is likely secondary to his long smoking history and less likely secondary to amiodarone toxicity.  He does have progressive changes on chest imaging over the last 5 years.  Unfortunately I do not feel he is a candidate for antifibrotic therapy at this time due to his significant weight loss since hospitalization.  The side effect profile of the antifibrotic's would put him at further GI complications and and further weight loss potential.  He has been referred to pulmonary rehab and is and is on the waiting list.  We will trial patient on Spiriva 2 puffs daily and monitor for any improvement in his breathing.  He is to continue supplemental oxygen during the day and at night with sleep.  He does not want to be reevaluated for his obstructive sleep apnea at this time.  We will place an order for a portable oxygen concentrator at this time.  Patient is to follow-up in 2 months.  Freda Jackson, MD [facility] Office: [phone]    Current Outpatient Medications:    amiodarone (PACERONE) 200 MG tablet, TAKE 1 TABLET EVERY DAY AND 1/2 TABLET  ON SATURDAY AND SUNDAY, Disp: 90 tablet, Rfl: 3   apixaban (ELIQUIS) 5 MG TABS tablet, Take 1 tablet (5 mg total) by mouth 2 (two) times daily., Disp: 60 tablet, Rfl: 11   atorvastatin (LIPITOR) 40 MG tablet, Take 1 tablet by mouth at bedtime., Disp: , Rfl:    cholecalciferol (VITAMIN D3) 25 MCG (1000 UNIT) tablet, Take 1,000 Units by mouth daily., Disp: , Rfl:    furosemide (LASIX) 40 MG tablet, TAKE 1 TABLET BY MOUTH EVERY DAY, Disp: 90 tablet, Rfl: 3   losartan (COZAAR) 25 MG tablet, Take 2 tablets by mouth daily., Disp: , Rfl:    Multiple Vitamin (MULTIVITAMIN WITH MINERALS) TABS tablet, Take 1 tablet by mouth daily., Disp: , Rfl:    Nystatin (GERHARDT'S BUTT CREAM) CREA, Apply 1 application topically 4 (four) times daily., Disp: 1 each, Rfl: 0   tamsulosin (FLOMAX) 0.4 MG CAPS capsule, Take 1 capsule (0.4 mg total) by mouth daily., Disp: 30 capsule, Rfl: 0   metoprolol tartrate (LOPRESSOR) 50 MG tablet, 1/2 tablet, Disp: , Rfl:

## 2021-06-01 NOTE — Patient Instructions (Addendum)
We will schedule you for follow up in 2 months  We will try to order you a portable oxygen concentrator  We will hold off on starting antifibrotic therapy and have our pharmacy team contact you regarding all of the benefits and side effects. We are holding off on treatment at this time due to side effects.   Start spiriva 2 puffs daily and monitor for any improvement in your breathing.   Continue supplemental oxygen therapy.

## 2021-06-02 ENCOUNTER — Encounter: Payer: Self-pay | Admitting: Pulmonary Disease

## 2021-06-02 LAB — PULMONARY FUNCTION TEST
FEF 25-75 Pre: 3.21 L/sec
FEF2575-%Pred-Pre: 141 %
FEV1-%Pred-Pre: 81 %
FEV1-Pre: 2.64 L
FEV1FVC-%Pred-Pre: 117 %
FEV6-%Pred-Pre: 73 %
FEV6-Pre: 3.14 L
FEV6FVC-%Pred-Pre: 106 %
FVC-%Pred-Pre: 69 %
FVC-Pre: 3.15 L
Pre FEV1/FVC ratio: 84 %
Pre FEV6/FVC Ratio: 100 %

## 2021-06-17 DIAGNOSIS — G4700 Insomnia, unspecified: Secondary | ICD-10-CM | POA: Diagnosis not present

## 2021-06-17 DIAGNOSIS — R350 Frequency of micturition: Secondary | ICD-10-CM | POA: Diagnosis not present

## 2021-06-17 DIAGNOSIS — R309 Painful micturition, unspecified: Secondary | ICD-10-CM | POA: Diagnosis not present

## 2021-06-25 DIAGNOSIS — I5042 Chronic combined systolic (congestive) and diastolic (congestive) heart failure: Secondary | ICD-10-CM | POA: Diagnosis not present

## 2021-07-26 DIAGNOSIS — I5042 Chronic combined systolic (congestive) and diastolic (congestive) heart failure: Secondary | ICD-10-CM | POA: Diagnosis not present

## 2021-07-28 ENCOUNTER — Ambulatory Visit: Payer: Medicare Other | Admitting: Physician Assistant

## 2021-07-30 ENCOUNTER — Encounter (HOSPITAL_COMMUNITY): Payer: Self-pay

## 2021-07-30 ENCOUNTER — Telehealth (HOSPITAL_COMMUNITY): Payer: Self-pay

## 2021-07-30 NOTE — Telephone Encounter (Signed)
Pt insurance is active and benefits verified through [facility] Medicare Co-pay $20, DED 0/0 met, out of pocket $3,600/0 met, co-insurance 0%. no pre-authorization required. Passport, [place] 07/30/2021_0 [place], REF# [id number]

## 2021-08-04 ENCOUNTER — Other Ambulatory Visit: Payer: Self-pay

## 2021-08-04 ENCOUNTER — Ambulatory Visit: Payer: Medicare Other | Admitting: Pulmonary Disease

## 2021-08-04 ENCOUNTER — Encounter: Payer: Self-pay | Admitting: Pulmonary Disease

## 2021-08-04 VITALS — BP 126/74 | HR 56 | Ht 75.0 in | Wt 178.0 lb

## 2021-08-04 DIAGNOSIS — G4734 Idiopathic sleep related nonobstructive alveolar hypoventilation: Secondary | ICD-10-CM

## 2021-08-04 DIAGNOSIS — J8410 Pulmonary fibrosis, unspecified: Secondary | ICD-10-CM | POA: Diagnosis not present

## 2021-08-04 NOTE — Patient Instructions (Addendum)
Use 2L of Oxygen at night when sleeping  We will continue to hold off anti-fibrotic treatment at this time given your on going nausea and slow weight gain.  Follow up in 4 months.

## 2021-08-04 NOTE — Progress Notes (Signed)
Synopsis: Referred in November 2022 for Pulmonary Fibrosis   Subjective:   PATIENT ID: Bruce Cohen GENDER: male DOB: 1940-10-31, MRN: [id number]  Lost his taste after covid, part of the reason why he hasn't been eating well.   HPI  Chief Complaint  Patient presents with   Follow-up    2 mo f/u. States he has been doing well since last visit. Currently not using any oxygen.     Srikar Chiang is an 81 year old male, former smoker with untreated OSA, atrial fibrillation, hypertension, CAD, non-ischemic cardiomyopathy and CKDIIIa who returns to pulmonary clinic for pulmonary fibrosis.   He has been feeling better since last visit, so much so that he has stopped using the supplemental oxygen. He has not received a portable oxygen concentrator. He is ambulating around his home better now that he is feeling better. He has gained a couple pounds of weight since last visit. He has not regained his sense of taste since having covid and reports this has significantly hindered him from eating well.   OV 06/01/21 Patient was admitted 10/13-10/15 for shortness of breath due to COVID-19 infection and pulmonary fibrosis.  He was qualified for supplemental oxygen and discharged on 2 L nasal cannula.  High-resolution CT chest scan during the admission showed moderate pulmonary fibrosis, interlobular septal thickening and several traction bronchiectasis with areas of subpleural bronchiolectasis.  Fibrotic changes are significantly worsened compared to prior exam in 2017.  Findings are categorized as probable UIP.  Patient reports losing weight since hospitalization as he has not been very mobile in order to prepare food for himself.  His room is on the second floor in the kitchen is on the main level.  His son has been trying to bring him food as often as possible but he feels he is losing weight as he is not eating as much as he was previously.  He does report his physical activity is improving over  the last week as he is able to make it to the kitchen to prepare food.  His appetite remains good.  Overall he reports having good days and bad days with increased dyspnea with exertion.  He denies any cough, sputum production or wheezing on the bad days.  Sleep study in 2016 showed an AHI of 14.7/h with mild oxygen desaturation to 84%.  Severe periodic limb movements occurred infrequent arousals.  Was unable to complete enough series on pulmonary function testing today due to fatigue and shortness of breath.  He is a former smoker and quit 2 years ago. Some concern for ILD dating back to chest CT in 09/2014 with scattered infiltrates but were in the setting of acute illness while being treated for a pneumonia and heart failure.  He was started on amiodarone 12/2015, but has CXR changes of mild diffuse interstitial prominence that predates starting amiodarone.  Chest CT was repeated on 03/2016 which showed resolved areas of consolidation, interlobular septal thickening and subpleural reticulation throughout both lungs, but favored to represent mild pulmonary edema, decreased mild mediastinal lymphadenopathy c/w benign reactive adenopathy, however NSIP could not be excluded.  He was then lost in pulmonary follow-up.  Past Medical History:  Diagnosis Date   CAD (coronary artery disease)    a. LHC 3/16: pLAD 20, mLAD 20, pRCA 20, EF 25%   History of PFTs    a. PFTs 8/17: FEV1 88% predicted; FEV1/FVC 81%, corr DLCO 57%   Hyperlipidemia    Hypertension    ILD (interstitial  lung disease) ([place])    LVH (left ventricular hypertrophy)    Mitral regurgitation    NICM (nonischemic cardiomyopathy) (HCC)    Tachy mediated 2/2 AF with RVR >> a. Echo 3/16: EF 30-35% >> b. Echo 6/16:  EF 50% with mild apical HK, mild LVH, Gr 2 DD, mild MR, severe BAE, PASP 43 mmHg   OSA (obstructive sleep apnea) 02/12/2015   Mild to moderate with AHI 14.7/hr no cpap   Pericarditis    a. Hospitalized in [place]. Episode in 1998.  Had a cath which was clean (also in 1990s). 2 further episodes that were mild.   Persistent atrial fibrillation ([place]) 09/2014   CHADS2-VASc=5 >> Eliquis // 6/17: Amiodarone started   PONV (postoperative nausea and vomiting)    Pulmonary fibrosis (HCC)    Sinus bradycardia    Thoracic ascending aortic aneurysm 04/26/2016   High res CT 9/17: 4.0 cm ascending thoracic aortic aneurysm // Chest CTA 9/18: ascending aorta 3.9 cm, CAD, benign thyroid nodules, stable lung reticular changes, aortic atherosclerosis     Family History  Problem Relation Age of Onset   Heart disease Mother        VALVE SURGERY   Stroke Mother    Heart failure Father    CAD Neg Hx    Heart attack Neg Hx      Social History   Socioeconomic History   Marital status: Married    Spouse name: Not on file   Number of children: 3   Years of education: 17 years    Highest education level: Not on file  Occupational History   Not on file  Tobacco Use   Smoking status: Former    Packs/day: 0.25    Years: 60.00    Pack years: 15.00    Types: Cigarettes    Quit date: 07/11/1998    Years since quitting: 23.0   Smokeless tobacco: Never  Vaping Use   Vaping Use: Never used  Substance and Sexual Activity   Alcohol use: Yes    Alcohol/week: 0.0 standard drinks    Comment: 09/29/2014 "maybe 3-4 beers/month"   Drug use: No   Sexual activity: Not Currently  Other Topics Concern   Not on file  Social History Narrative   Lives with wife and son.     Social Determinants of Health   Financial Resource Strain: Not on file  Food Insecurity: Not on file  Transportation Needs: Not on file  Physical Activity: Not on file  Stress: Not on file  Social Connections: Not on file  Intimate Partner Violence: Not on file     No Known Allergies   Outpatient Medications Prior to Visit  Medication Sig Dispense Refill   amiodarone (PACERONE) 200 MG tablet TAKE 1 TABLET EVERY DAY AND 1/2 TABLET ON SATURDAY AND SUNDAY 90 tablet  3   apixaban (ELIQUIS) 5 MG TABS tablet Take 1 tablet (5 mg total) by mouth 2 (two) times daily. 60 tablet 11   atorvastatin (LIPITOR) 40 MG tablet Take 1 tablet by mouth at bedtime.     cholecalciferol (VITAMIN D3) 25 MCG (1000 UNIT) tablet Take 1,000 Units by mouth daily.     furosemide (LASIX) 40 MG tablet TAKE 1 TABLET BY MOUTH EVERY DAY 90 tablet 3   losartan (COZAAR) 25 MG tablet Take 2 tablets by mouth daily.     metoprolol tartrate (LOPRESSOR) 50 MG tablet 1/2 tablet     Multiple Vitamin (MULTIVITAMIN WITH MINERALS) TABS tablet Take 1  tablet by mouth daily.     Nystatin (GERHARDT'S BUTT CREAM) CREA Apply 1 application topically 4 (four) times daily. 1 each 0   tamsulosin (FLOMAX) 0.4 MG CAPS capsule Take 1 capsule (0.4 mg total) by mouth daily. 30 capsule 0   No facility-administered medications prior to visit.   Review of Systems  Constitutional:  Negative for chills, fever, malaise/fatigue and weight loss.  HENT:  Negative for congestion, sinus pain and sore throat.   Eyes: Negative.   Respiratory:  Positive for shortness of breath. Negative for cough, hemoptysis, sputum production and wheezing.   Cardiovascular:  Negative for chest pain, palpitations, orthopnea, claudication and leg swelling.  Gastrointestinal:  Positive for nausea. Negative for abdominal pain, heartburn and vomiting.  Genitourinary: Negative.   Musculoskeletal:  Negative for joint pain and myalgias.  Skin:  Negative for rash.  Neurological:  Negative for weakness.  Endo/Heme/Allergies: Negative.   Psychiatric/Behavioral: Negative.     Objective:   Vitals:   08/04/21 1409  BP: 126/74  Pulse: (!) 56  SpO2: 97%  Weight: 178 lb (80.7 kg)  Height: 6\' 3"  (1.905 m)   Physical Exam Constitutional:      General: He is not in acute distress.    Appearance: He is ill-appearing (chronically).     Comments: thin  HENT:     Head: Normocephalic and atraumatic.  Eyes:     Extraocular Movements: Extraocular  movements intact.     Conjunctiva/sclera: Conjunctivae normal.     Pupils: Pupils are equal, round, and reactive to light.  Cardiovascular:     Rate and Rhythm: Normal rate and regular rhythm.     Pulses: Normal pulses.     Heart sounds: Normal heart sounds. No murmur heard. Pulmonary:     Effort: Pulmonary effort is normal.     Breath sounds: Decreased air movement present. Decreased breath sounds and rales (bilateral bases) present.  Abdominal:     General: Bowel sounds are normal.     Palpations: Abdomen is soft.  Musculoskeletal:     Right lower leg: No edema.     Left lower leg: No edema.  Lymphadenopathy:     Cervical: No cervical adenopathy.  Skin:    General: Skin is warm and dry.  Neurological:     General: No focal deficit present.     Mental Status: He is alert.  Psychiatric:        Mood and Affect: Mood normal.        Behavior: Behavior normal.        Thought Content: Thought content normal.        Judgment: Judgment normal.   CBC    Component Value Date/Time   WBC 8.1 04/24/2021 0352   RBC 3.33 (L) 04/24/2021 0352   HGB 10.8 (L) 04/24/2021 0352   HGB 14.5 12/12/2016 1107   HCT 33.0 (L) 04/24/2021 0352   HCT 42.6 12/12/2016 1107   PLT 346 04/24/2021 0352   PLT 370 12/12/2016 1107   MCV 99.1 04/24/2021 0352   MCV 96 12/12/2016 1107   MCH 32.4 04/24/2021 0352   MCHC 32.7 04/24/2021 0352   RDW 14.0 04/24/2021 0352   RDW 14.3 12/12/2016 1107   LYMPHSABS 1.8 04/24/2021 0352   LYMPHSABS 1.6 12/12/2016 1107   MONOABS 0.7 04/24/2021 0352   EOSABS 0.2 04/24/2021 0352   EOSABS 0.1 12/12/2016 1107   BASOSABS 0.1 04/24/2021 0352   BASOSABS 0.0 12/12/2016 1107   Chest imaging: HRCT Chest 04/23/21 Lungs/Pleura: There  is moderate pulmonary fibrosis in a pattern with apical to basal gradient, featuring irregular peripheral interstitial opacity, interlobular septal thickening, subtle traction bronchiectasis, and small areas of subpleural bronchiolectasis.  Fibrotic findings are significantly worsened compared to prior examination dated 2017. No significant air trapping on expiratory phase imaging. No pleural effusion or pneumothorax.  PFT: PFT Results Latest Ref Rng & Units 06/01/2021 02/29/2016  FVC-Pre L 3.15 3.79  FVC-Predicted Pre % 69 80  FVC-Post L - 3.82  FVC-Predicted Post % - 80  Pre FEV1/FVC % % 84 81  Post FEV1/FCV % % - 84  FEV1-Pre L 2.64 3.06  FEV1-Predicted Pre % 81 88  FEV1-Post L - 3.20  DLCO uncorrected ml/min/mmHg - 20.79  DLCO UNC% % - 57  DLCO corrected ml/min/mmHg - 20.91  DLCO COR %Predicted % - 57  DLVA Predicted % - 78  TLC L - 6.36  TLC % Predicted % - 83  RV % Predicted % - 86   Labs:  Path:  Echo 04/23/21: LVEF 50 to 55%.  Moderate concentric left ventricle hypertrophy.  Grade 2 diastolic dysfunction.  Right ventricular systolic function is normal.  RV size is normal.  Left atrial size is severely dilated.  Right atrial size is severely dilated.  Heart Catheterization:  Assessment & Plan:   Pulmonary fibrosis (HCC)  Nocturnal hypoxemia  Discussion: Ulysee Fyock is an 81 year old male, former smoker with untreated OSA, atrial fibrillation, hypertension, CAD, non-ischemic cardiomyopathy and CKDIIIa who returns to pulmonary clinic for pulmonary fibrosis.   Patient's pulmonary fibrosis is likely secondary to his long smoking history and less likely secondary to amiodarone toxicity.  He does have progressive changes on chest imaging over the last 5 years.  Unfortunately I do not feel he is a candidate for antifibrotic therapy at this time due to his deconditioned state, which is improving since last visit. He also continues to have nausea when eating.  The side effect profile of the antifibrotic's would put him at further GI complications and and further weight loss potential. We will consider this therapy in the future if his conditioning improves along with his nutritional status.   He is to  continue supplemental oxygen at night with sleep.  On simple walk today he does not require supplemental oxygen with short walk distances.  Patient is to follow-up in 4 months.  Freda Jackson, MD [facility] Office: [phone]    Current Outpatient Medications:    amiodarone (PACERONE) 200 MG tablet, TAKE 1 TABLET EVERY DAY AND 1/2 TABLET ON SATURDAY AND SUNDAY, Disp: 90 tablet, Rfl: 3   apixaban (ELIQUIS) 5 MG TABS tablet, Take 1 tablet (5 mg total) by mouth 2 (two) times daily., Disp: 60 tablet, Rfl: 11   atorvastatin (LIPITOR) 40 MG tablet, Take 1 tablet by mouth at bedtime., Disp: , Rfl:    cholecalciferol (VITAMIN D3) 25 MCG (1000 UNIT) tablet, Take 1,000 Units by mouth daily., Disp: , Rfl:    furosemide (LASIX) 40 MG tablet, TAKE 1 TABLET BY MOUTH EVERY DAY, Disp: 90 tablet, Rfl: 3   losartan (COZAAR) 25 MG tablet, Take 2 tablets by mouth daily., Disp: , Rfl:    metoprolol tartrate (LOPRESSOR) 50 MG tablet, 1/2 tablet, Disp: , Rfl:    Multiple Vitamin (MULTIVITAMIN WITH MINERALS) TABS tablet, Take 1 tablet by mouth daily., Disp: , Rfl:    Nystatin (GERHARDT'S BUTT CREAM) CREA, Apply 1 application topically 4 (four) times daily., Disp: 1 each, Rfl: 0  tamsulosin (FLOMAX) 0.4 MG CAPS capsule, Take 1 capsule (0.4 mg total) by mouth daily., Disp: 30 capsule, Rfl: 0

## 2021-08-09 DIAGNOSIS — R6520 Severe sepsis without septic shock: Secondary | ICD-10-CM | POA: Diagnosis not present

## 2021-08-09 DIAGNOSIS — J84112 Idiopathic pulmonary fibrosis: Secondary | ICD-10-CM | POA: Diagnosis not present

## 2021-08-09 DIAGNOSIS — Z20822 Contact with and (suspected) exposure to covid-19: Secondary | ICD-10-CM | POA: Diagnosis not present

## 2021-08-09 DIAGNOSIS — N179 Acute kidney failure, unspecified: Secondary | ICD-10-CM | POA: Diagnosis not present

## 2021-08-09 DIAGNOSIS — E785 Hyperlipidemia, unspecified: Secondary | ICD-10-CM | POA: Diagnosis not present

## 2021-08-09 DIAGNOSIS — I509 Heart failure, unspecified: Secondary | ICD-10-CM | POA: Diagnosis not present

## 2021-08-09 DIAGNOSIS — Z7901 Long term (current) use of anticoagulants: Secondary | ICD-10-CM | POA: Diagnosis not present

## 2021-08-09 DIAGNOSIS — I4891 Unspecified atrial fibrillation: Secondary | ICD-10-CM | POA: Diagnosis not present

## 2021-08-09 DIAGNOSIS — I428 Other cardiomyopathies: Secondary | ICD-10-CM | POA: Diagnosis not present

## 2021-08-09 DIAGNOSIS — R0602 Shortness of breath: Secondary | ICD-10-CM | POA: Diagnosis not present

## 2021-08-09 DIAGNOSIS — I1310 Hypertensive heart and chronic kidney disease without heart failure, with stage 1 through stage 4 chronic kidney disease, or unspecified chronic kidney disease: Secondary | ICD-10-CM | POA: Diagnosis not present

## 2021-08-09 DIAGNOSIS — J9601 Acute respiratory failure with hypoxia: Secondary | ICD-10-CM | POA: Diagnosis not present

## 2021-08-09 DIAGNOSIS — J9621 Acute and chronic respiratory failure with hypoxia: Secondary | ICD-10-CM | POA: Diagnosis not present

## 2021-08-09 DIAGNOSIS — I501 Left ventricular failure: Secondary | ICD-10-CM | POA: Diagnosis not present

## 2021-08-09 DIAGNOSIS — I482 Chronic atrial fibrillation, unspecified: Secondary | ICD-10-CM | POA: Diagnosis not present

## 2021-08-09 DIAGNOSIS — I10 Essential (primary) hypertension: Secondary | ICD-10-CM | POA: Diagnosis not present

## 2021-08-09 DIAGNOSIS — Z9989 Dependence on other enabling machines and devices: Secondary | ICD-10-CM | POA: Diagnosis not present

## 2021-08-09 DIAGNOSIS — D72829 Elevated white blood cell count, unspecified: Secondary | ICD-10-CM | POA: Diagnosis not present

## 2021-08-09 DIAGNOSIS — Z743 Need for continuous supervision: Secondary | ICD-10-CM | POA: Diagnosis not present

## 2021-08-09 DIAGNOSIS — T68XXXA Hypothermia, initial encounter: Secondary | ICD-10-CM | POA: Diagnosis not present

## 2021-08-09 DIAGNOSIS — I130 Hypertensive heart and chronic kidney disease with heart failure and stage 1 through stage 4 chronic kidney disease, or unspecified chronic kidney disease: Secondary | ICD-10-CM | POA: Diagnosis not present

## 2021-08-09 DIAGNOSIS — Z87891 Personal history of nicotine dependence: Secondary | ICD-10-CM | POA: Diagnosis not present

## 2021-08-09 DIAGNOSIS — I5023 Acute on chronic systolic (congestive) heart failure: Secondary | ICD-10-CM | POA: Diagnosis not present

## 2021-08-09 DIAGNOSIS — J8410 Pulmonary fibrosis, unspecified: Secondary | ICD-10-CM | POA: Diagnosis not present

## 2021-08-09 DIAGNOSIS — K5900 Constipation, unspecified: Secondary | ICD-10-CM | POA: Diagnosis not present

## 2021-08-09 DIAGNOSIS — R6889 Other general symptoms and signs: Secondary | ICD-10-CM | POA: Diagnosis not present

## 2021-08-09 DIAGNOSIS — R918 Other nonspecific abnormal finding of lung field: Secondary | ICD-10-CM | POA: Diagnosis not present

## 2021-08-09 DIAGNOSIS — Z8616 Personal history of COVID-19: Secondary | ICD-10-CM | POA: Diagnosis not present

## 2021-08-09 DIAGNOSIS — I119 Hypertensive heart disease without heart failure: Secondary | ICD-10-CM | POA: Diagnosis not present

## 2021-08-09 DIAGNOSIS — J449 Chronic obstructive pulmonary disease, unspecified: Secondary | ICD-10-CM | POA: Diagnosis not present

## 2021-08-09 DIAGNOSIS — J90 Pleural effusion, not elsewhere classified: Secondary | ICD-10-CM | POA: Diagnosis not present

## 2021-08-09 DIAGNOSIS — Z515 Encounter for palliative care: Secondary | ICD-10-CM | POA: Diagnosis not present

## 2021-08-09 DIAGNOSIS — I2510 Atherosclerotic heart disease of native coronary artery without angina pectoris: Secondary | ICD-10-CM | POA: Diagnosis not present

## 2021-08-09 DIAGNOSIS — G4733 Obstructive sleep apnea (adult) (pediatric): Secondary | ICD-10-CM | POA: Diagnosis not present

## 2021-08-09 DIAGNOSIS — I5022 Chronic systolic (congestive) heart failure: Secondary | ICD-10-CM | POA: Diagnosis not present

## 2021-08-09 DIAGNOSIS — Z7401 Bed confinement status: Secondary | ICD-10-CM | POA: Diagnosis not present

## 2021-08-09 DIAGNOSIS — Z66 Do not resuscitate: Secondary | ICD-10-CM | POA: Diagnosis not present

## 2021-08-09 DIAGNOSIS — J8489 Other specified interstitial pulmonary diseases: Secondary | ICD-10-CM | POA: Diagnosis not present

## 2021-08-09 DIAGNOSIS — R1084 Generalized abdominal pain: Secondary | ICD-10-CM | POA: Diagnosis not present

## 2021-08-09 DIAGNOSIS — J811 Chronic pulmonary edema: Secondary | ICD-10-CM | POA: Diagnosis not present

## 2021-08-09 DIAGNOSIS — I5021 Acute systolic (congestive) heart failure: Secondary | ICD-10-CM | POA: Diagnosis not present

## 2021-08-09 DIAGNOSIS — Z9114 Patient's other noncompliance with medication regimen: Secondary | ICD-10-CM | POA: Diagnosis not present

## 2021-08-09 DIAGNOSIS — Z9981 Dependence on supplemental oxygen: Secondary | ICD-10-CM | POA: Diagnosis not present

## 2021-08-09 DIAGNOSIS — E8770 Fluid overload, unspecified: Secondary | ICD-10-CM | POA: Diagnosis not present

## 2021-08-09 DIAGNOSIS — Z87440 Personal history of urinary (tract) infections: Secondary | ICD-10-CM | POA: Diagnosis not present

## 2021-08-09 DIAGNOSIS — J849 Interstitial pulmonary disease, unspecified: Secondary | ICD-10-CM | POA: Diagnosis not present

## 2021-08-09 DIAGNOSIS — R1031 Right lower quadrant pain: Secondary | ICD-10-CM | POA: Diagnosis not present

## 2021-08-09 DIAGNOSIS — R0902 Hypoxemia: Secondary | ICD-10-CM | POA: Diagnosis not present

## 2021-08-09 DIAGNOSIS — N189 Chronic kidney disease, unspecified: Secondary | ICD-10-CM | POA: Diagnosis not present

## 2021-08-09 DIAGNOSIS — I129 Hypertensive chronic kidney disease with stage 1 through stage 4 chronic kidney disease, or unspecified chronic kidney disease: Secondary | ICD-10-CM | POA: Diagnosis not present

## 2021-08-09 DIAGNOSIS — A419 Sepsis, unspecified organism: Secondary | ICD-10-CM | POA: Diagnosis not present

## 2021-08-19 NOTE — Telephone Encounter (Signed)
No response from pt.  Closed referral  

## 2021-08-26 DIAGNOSIS — I5042 Chronic combined systolic (congestive) and diastolic (congestive) heart failure: Secondary | ICD-10-CM | POA: Diagnosis not present

## 2021-09-08 DEATH — deceased

## 2021-10-01 NOTE — Progress Notes (Deleted)
? ?Cardiology Office Note   ? ?Date:  10/01/2021  ? ?ID:  Bruce Cohen, DOB 02/10/1941, MRN [id number] ? ? ?PCP:  Josetta Huddle, MD ?  ?[facility]  ?Cardiologist:  Larae Grooms, MD   ?Advanced Practice Provider:  No care team member to display ?Electrophysiologist:  None  ? ?[id number]}  ? ?No chief complaint on file. ? ? ?History of Present Illness:  ?Bruce Cohen is a 81 y.o. male with a history of dilated cardiomyopathy and PAF on eliquis, complicated by amiodarone use and bradycardia; HTN, Thoracic Aortic aneursym, CKD Stage IIIb, chronic pericarditis, pulmonary fibrosis. ? ?Patient was last seen in the hospital 04/2021 with SOB and recent covid19 infection.. Echo showed normal LVEF. Dyspnea felt secondary pulm fibrosis. ? ?Hospitalized 08/09/21 [facility] with ILD exacerbation and sepsis 2/2 CAP no improvement with diuresis or antibiotics. Required high dose steroids and high flow in [facility]. Ultimately switched to comfort care and inpatient hospice.  ? ?Past Medical History:  ?Diagnosis Date  ? CAD (coronary artery disease)   ? a. LHC 3/16: pLAD 20, mLAD 20, pRCA 20, EF 25%  ? History of PFTs   ? a. PFTs 8/17: FEV1 88% predicted; FEV1/FVC 81%, corr DLCO 57%  ? Hyperlipidemia   ? Hypertension   ? ILD (interstitial lung disease) ([place])   ? LVH (left ventricular hypertrophy)   ? Mitral regurgitation   ? NICM (nonischemic cardiomyopathy) ([place])   ? Tachy mediated 2/2 AF with RVR >> a. Echo 3/16: EF 30-35% >> b. Echo 6/16:  EF 50% with mild apical HK, mild LVH, Gr 2 DD, mild MR, severe BAE, PASP 43 mmHg  ? OSA (obstructive sleep apnea) 02/12/2015  ? Mild to moderate with AHI 14.7/hr no cpap  ? Pericarditis   ? a. Hospitalized in [place]. Episode in 1998. Had a cath which was clean (also in 1990s). 2 further episodes that were mild.  ? Persistent atrial fibrillation ([place]) 09/2014  ? CHADS2-VASc=5 >> Eliquis // 6/17: Amiodarone started  ? PONV (postoperative nausea and vomiting)   ?  Pulmonary fibrosis ([place])   ? Sinus bradycardia   ? Thoracic ascending aortic aneurysm 04/26/2016  ? High res CT 9/17: 4.0 cm ascending thoracic aortic aneurysm // Chest CTA 9/18: ascending aorta 3.9 cm, CAD, benign thyroid nodules, stable lung reticular changes, aortic atherosclerosis  ? ? ?Past Surgical History:  ?Procedure Laterality Date  ? APPENDECTOMY    ? BACK SURGERY    ? CARDIAC CATHETERIZATION  1998  ? "clean"  ? CARDIOVERSION N/A 11/21/2014  ? Procedure: CARDIOVERSION;  Surgeon: Fay Records, MD;  Location: [facility];  Service: Cardiovascular;  Laterality: N/A;  ? CARDIOVERSION N/A 10/22/2015  ? Procedure: CARDIOVERSION;  Surgeon: Herminio Commons, MD;  Location[facility] ENDOSCOPY;  Service: Cardiovascular;  Laterality: N/A;  ? FRACTURE SURGERY    ? LEFT HEART CATHETERIZATION WITH CORONARY ANGIOGRAM N/A 10/01/2014  ? Procedure: LEFT HEART CATHETERIZATION WITH CORONARY ANGIOGRAM;  Surgeon: Wellington Hampshire, MD;  Location: [facility] CATH LAB;  Service: Cardiovascular;  Laterality: N/A;  ? LESION REMOVAL Left 10/31/2019  ? Procedure: EXCISION SKIN NEOPLASM LEFT UPPER CHEST WALL, 9X3 CM WITH LAYERED CLOSURE;  Surgeon: Armandina Gemma, MD;  Location: [facility];  Service: General;  Laterality: Left;  ? LUMBAR LAMINECTOMY  1970's  ? "partial"  ? ORIF FINGER / THUMB FRACTURE Left ~ 2006  ? "thumb"  ? TONSILLECTOMY    ? ? ?Current Medications: ?No outpatient medications have been marked  as taking for the 10/06/21 encounter (Appointment) with Imogene Burn, PA-C.  ?  ? ?Allergies:   Patient has no known allergies.  ? ?Social History  ? ?Socioeconomic History  ? Marital status: Married  ?  Spouse name: Not on file  ? Number of children: 3  ? Years of education: 17 years   ? Highest education level: Not on file  ?Occupational History  ? Not on file  ?Tobacco Use  ? Smoking status: Former  ?  Packs/day: 0.25  ?  Years: 60.00  ?  Pack years: 15.00  ?  Types: Cigarettes  ?  Quit date: 07/11/1998  ?  Years since quitting: 23.2  ?  Smokeless tobacco: Never  ?Vaping Use  ? Vaping Use: Never used  ?Substance and Sexual Activity  ? Alcohol use: Yes  ?  Alcohol/week: 0.0 standard drinks  ?  Comment: 09/29/2014 "maybe 3-4 beers/month"  ? Drug use: No  ? Sexual activity: Not Currently  ?Other Topics Concern  ? Not on file  ?Social History Narrative  ? Lives with wife and son.    ? ?Social Determinants of Health  ? ?Financial Resource Strain: Not on file  ?Food Insecurity: Not on file  ?Transportation Needs: Not on file  ?Physical Activity: Not on file  ?Stress: Not on file  ?Social Connections: Not on file  ?  ? ?Family History:  The patient's ***family history includes Heart disease in his mother; Heart failure in his father; Stroke in his mother.  ? ?ROS:   ?Please see the history of present illness.    ?ROS All other systems reviewed and are negative. ? ? ?PHYSICAL EXAM:   ?VS:  There were no vitals taken for this visit.  ?Physical Exam  ?GEN: Well nourished, well developed, in no acute distress  ?HEENT: normal  ?Neck: no JVD, carotid bruits, or masses ?Cardiac:RRR; no murmurs, rubs, or gallops  ?Respiratory:  clear to auscultation bilaterally, normal work of breathing ?GI: soft, nontender, nondistended, + BS ?Ext: without cyanosis, clubbing, or edema, Good distal pulses bilaterally ?MS: no deformity or atrophy  ?Skin: warm and dry, no rash ?Neuro:  Alert and Oriented x 3, Strength and sensation are intact ?Psych: euthymic mood, full affect ? ?Wt Readings from Last 3 Encounters:  ?08/04/21 178 lb (80.7 kg)  ?06/01/21 176 lb (79.8 kg)  ?04/22/21 169 lb 4 oz (76.8 kg)  ?  ? ? ?Studies/Labs Reviewed:  ? ?EKG:  EKG is*** ordered today.  The ekg ordered today demonstrates *** ? ?Recent Labs: ?04/22/2021: B Natriuretic Peptide 582.4; TSH 1.593 ?04/24/2021: ALT 19; BUN 20; Creatinine, Ser 1.48; Hemoglobin 10.8; Magnesium 2.1; Platelets 346; Potassium 4.0; Sodium 140  ? ?Lipid Panel ?   ?Component Value Date/Time  ? CHOL 122 01/14/2015 1147  ? TRIG 92.0  01/14/2015 1147  ? HDL 43.90 01/14/2015 1147  ? CHOLHDL 3 01/14/2015 1147  ? VLDL 18.4 01/14/2015 1147  ? [place] 60 01/14/2015 1147  ? ? ?Additional studies/ records that were reviewed today include:  ?Echo 04/2021 ?IMPRESSIONS  ? ? ? 1. Left ventricular ejection fraction, by estimation, is 50 to 55%. The  ?left ventricle has low normal function. The left ventricle demonstrates  ?regional wall motion abnormalities (see scoring diagram/findings for  ?description). There is moderate  ?concentric left ventricular hypertrophy. Left ventricular diastolic  ?parameters are consistent with Grade II diastolic dysfunction  ?(pseudonormalization). Elevated left atrial pressure. There is mild  ?hypokinesis of the left ventricular, apical segment.  ?  2. Right ventricular systolic function is normal. The right ventricular  ?size is normal.  ? 3. Left atrial size was severely dilated.  ? 4. Right atrial size was severely dilated.  ? 5. The mitral valve is normal in structure. Trivial mitral valve  ?regurgitation.  ? 6. The aortic valve is tricuspid. Aortic valve regurgitation is not  ?visualized. No aortic stenosis is present.  ? ?FINDINGS  ? Left Ventricle: Left ventricular ejection fraction, by estimation, is 50  ?to 55%. The left ventricle has low normal function. The left ventricle  ?demonstrates regional wall motion abnormalities. The left ventricular  ?internal cavity size was normal in  ?size. There is moderate concentric left ventricular hypertrophy. Left  ?ventricular diastolic parameters are consistent with Grade II diastolic  ?dysfunction (pseudonormalization). Elevated left atrial pressure.  ? ?Right Ventricle: The right ventricular size is normal. No increase in  ?right ventricular wall thickness. Right ventricular systolic function is  ?normal.  ? ?Left [facility]: Left atrial size was severely dilated.  ? ?Right [facility]: Right atrial size was severely dilated.  ? ?Pericardium: There is no evidence of pericardial  effusion.  ? ?Mitral Valve: The mitral valve is normal in structure. Mild mitral annular  ?calcification. Trivial mitral valve regurgitation.  ? ?Tricuspid Valve: The tricuspid valve is normal in structure. Tricuspid  ?valv

## 2021-10-06 ENCOUNTER — Ambulatory Visit: Payer: Medicare Other | Admitting: Physician Assistant

## 2021-10-06 DIAGNOSIS — G4733 Obstructive sleep apnea (adult) (pediatric): Secondary | ICD-10-CM

## 2021-10-06 DIAGNOSIS — I10 Essential (primary) hypertension: Secondary | ICD-10-CM

## 2021-10-06 DIAGNOSIS — E785 Hyperlipidemia, unspecified: Secondary | ICD-10-CM

## 2021-10-06 DIAGNOSIS — I2510 Atherosclerotic heart disease of native coronary artery without angina pectoris: Secondary | ICD-10-CM

## 2021-10-06 DIAGNOSIS — I480 Paroxysmal atrial fibrillation: Secondary | ICD-10-CM

## 2021-10-06 DIAGNOSIS — I428 Other cardiomyopathies: Secondary | ICD-10-CM
